# Patient Record
Sex: Female | Born: 1950 | Race: White | Hispanic: No | Marital: Married | Smoking: Former smoker
Health system: Southern US, Community
[De-identification: ages and names within clinical notes are randomized; demographics above are authoritative.]

## PROBLEM LIST (undated history)

## (undated) DIAGNOSIS — K589 Irritable bowel syndrome without diarrhea: Secondary | ICD-10-CM

## (undated) DIAGNOSIS — F419 Anxiety disorder, unspecified: Secondary | ICD-10-CM

## (undated) DIAGNOSIS — K219 Gastro-esophageal reflux disease without esophagitis: Secondary | ICD-10-CM

## (undated) DIAGNOSIS — R7303 Prediabetes: Secondary | ICD-10-CM

## (undated) DIAGNOSIS — K5790 Diverticulosis of intestine, part unspecified, without perforation or abscess without bleeding: Secondary | ICD-10-CM

## (undated) DIAGNOSIS — I10 Essential (primary) hypertension: Secondary | ICD-10-CM

## (undated) DIAGNOSIS — Z9889 Other specified postprocedural states: Secondary | ICD-10-CM

## (undated) HISTORY — DX: Anxiety disorder, unspecified: F41.9

## (undated) HISTORY — DX: Essential (primary) hypertension: I10

## (undated) HISTORY — DX: Prediabetes: R73.03

## (undated) HISTORY — PX: TONSILLECTOMY: SUR1361

## (undated) HISTORY — DX: Irritable bowel syndrome, unspecified: K58.9

## (undated) HISTORY — DX: Other specified postprocedural states: Z98.890

## (undated) HISTORY — PX: CHOLECYSTECTOMY: SHX55

## (undated) HISTORY — DX: Gastro-esophageal reflux disease without esophagitis: K21.9

## (undated) HISTORY — PX: OTHER SURGICAL HISTORY: SHX169

## (undated) HISTORY — DX: Diverticulosis of intestine, part unspecified, without perforation or abscess without bleeding: K57.90

---

## 2001-02-16 ENCOUNTER — Ambulatory Visit (HOSPITAL_COMMUNITY): Admission: RE | Admit: 2001-02-16 | Discharge: 2001-02-16 | Payer: Self-pay | Admitting: Internal Medicine

## 2001-03-12 ENCOUNTER — Ambulatory Visit (HOSPITAL_COMMUNITY): Admission: RE | Admit: 2001-03-12 | Discharge: 2001-03-12 | Payer: Self-pay | Admitting: Internal Medicine

## 2001-03-12 ENCOUNTER — Encounter: Payer: Self-pay | Admitting: Internal Medicine

## 2004-08-26 ENCOUNTER — Ambulatory Visit: Payer: Self-pay | Admitting: Internal Medicine

## 2004-10-24 ENCOUNTER — Ambulatory Visit: Payer: Self-pay | Admitting: Internal Medicine

## 2005-04-14 ENCOUNTER — Ambulatory Visit: Payer: Self-pay | Admitting: Internal Medicine

## 2005-07-17 ENCOUNTER — Emergency Department (HOSPITAL_COMMUNITY): Admission: EM | Admit: 2005-07-17 | Discharge: 2005-07-17 | Payer: Self-pay | Admitting: Emergency Medicine

## 2006-05-18 ENCOUNTER — Ambulatory Visit: Payer: Self-pay | Admitting: Internal Medicine

## 2006-06-08 ENCOUNTER — Ambulatory Visit (HOSPITAL_COMMUNITY): Admission: RE | Admit: 2006-06-08 | Discharge: 2006-06-08 | Payer: Self-pay | Admitting: Internal Medicine

## 2006-06-08 ENCOUNTER — Ambulatory Visit: Payer: Self-pay | Admitting: Internal Medicine

## 2006-06-08 HISTORY — PX: COLONOSCOPY: SHX174

## 2006-06-08 HISTORY — PX: ESOPHAGOGASTRODUODENOSCOPY: SHX1529

## 2006-08-07 ENCOUNTER — Emergency Department (HOSPITAL_COMMUNITY): Admission: EM | Admit: 2006-08-07 | Discharge: 2006-08-07 | Payer: Self-pay | Admitting: Emergency Medicine

## 2006-10-26 ENCOUNTER — Ambulatory Visit: Payer: Self-pay | Admitting: Internal Medicine

## 2007-05-18 ENCOUNTER — Ambulatory Visit: Payer: Self-pay | Admitting: Internal Medicine

## 2009-04-25 ENCOUNTER — Telehealth (INDEPENDENT_AMBULATORY_CARE_PROVIDER_SITE_OTHER): Payer: Self-pay | Admitting: *Deleted

## 2009-05-28 DIAGNOSIS — R197 Diarrhea, unspecified: Secondary | ICD-10-CM | POA: Insufficient documentation

## 2009-05-28 DIAGNOSIS — F411 Generalized anxiety disorder: Secondary | ICD-10-CM | POA: Insufficient documentation

## 2009-05-28 DIAGNOSIS — T7840XA Allergy, unspecified, initial encounter: Secondary | ICD-10-CM | POA: Insufficient documentation

## 2009-05-28 DIAGNOSIS — K589 Irritable bowel syndrome without diarrhea: Secondary | ICD-10-CM | POA: Insufficient documentation

## 2009-05-29 ENCOUNTER — Ambulatory Visit: Payer: Self-pay | Admitting: Internal Medicine

## 2009-05-29 DIAGNOSIS — K219 Gastro-esophageal reflux disease without esophagitis: Secondary | ICD-10-CM | POA: Insufficient documentation

## 2009-05-29 DIAGNOSIS — K648 Other hemorrhoids: Secondary | ICD-10-CM | POA: Insufficient documentation

## 2009-05-29 DIAGNOSIS — K921 Melena: Secondary | ICD-10-CM | POA: Insufficient documentation

## 2009-06-13 ENCOUNTER — Telehealth: Payer: Self-pay | Admitting: Gastroenterology

## 2010-04-22 ENCOUNTER — Ambulatory Visit: Payer: Self-pay | Admitting: Gastroenterology

## 2010-07-03 ENCOUNTER — Encounter: Payer: Self-pay | Admitting: Gastroenterology

## 2010-10-16 ENCOUNTER — Encounter (INDEPENDENT_AMBULATORY_CARE_PROVIDER_SITE_OTHER): Payer: Self-pay | Admitting: *Deleted

## 2010-10-29 NOTE — Medication Information (Signed)
Summary: aciphex rx  aciphex rx   Imported By: Hendricks Limes LPN 16/06/9603 54:09:81  _____________________________________________________________________  External Attachment:    Type:   Image     Comment:   External Document

## 2010-10-29 NOTE — Assessment & Plan Note (Signed)
Summary: MEDS NOT WORKING,HAVING PROBLEMS WITH REFLUX/SS   Visit Type:  f/u Primary Care Provider:  [name], MD  Chief Complaint:  problems with reflux- alot of burning.  History of Present Illness: Patient is here for f/u of GERD. Recently had flare with increased heartburn and belching. Lasted for about two weeks. Took Aciphex two times a day and now heartburn resolved. Also had PNA in 5/11, treated as outpatient. Now c/o increased belching, but some improvement with avoidance of spicey foods. Denies dysphagia. Weight is up 6 pounds. Appetite good, but less active due to PNA and hot weather. She sucks on hard candy but not daily. Drinks out of straw at restaurants only.  Patient notes her reflux is lot better since she called to make appt.  Current Medications (verified): 1)  Aciphex 20 Mg Tbec (Rabeprazole Sodium) .... Once Daily 2)  Lexapro 20 Mg Tabs (Escitalopram Oxalate) .... Once Daily 3)  Toprol Xl 50 Mg Xr24h-Tab (Metoprolol Succinate) .... Once Daily 4)  Lorazepam 0.5 Mg Tabs (Lorazepam) .... As Needed 5)  Imitrex 50 Mg Tabs (Sumatriptan Succinate) .... As Needed 6)  Aspirin 81 Mg Tabs (Aspirin) .... Once Daily 7)  Multivitamins  Tabs (Multiple Vitamin) .... Once Daily 8)  Fish Oil 1000 Mg Caps (Omega-3 Fatty Acids) .... Once Daily 9)  Caltrate 600+d 600-400 Mg-Unit Tabs (Calcium Carbonate-Vitamin D) .... Two Times A Day 10)  Pepto .... As Needed 11)  Aleve .... As Needed 12)  Tums .... As Needed 13)  Claritin .... As Needed 14)  Zetia 10 Mg Tabs (Ezetimibe) .... Once Daily 15)  Losartan Potassium-Hctz 50-12.5 Mg Tabs (Losartan Potassium-Hctz) .... Once Daily  Allergies (verified): 1)  ! Ultram 2)  ! Toradol 3)  ! * Guaifenesin 4)  ! * Prempro 5)  ! * Transdermal Patch  Family History: Father had "stomach cancer" Sister, deceased age 43, throat cancer  Review of Systems      See HPI  Vital Signs:  Patient profile:   60 year old female Height:      67  inches Weight:      178 pounds BMI:     27.98 Temp:     97.8 degrees F oral Pulse rate:   80 / minute BP sitting:   102 / 78  (left arm) Cuff size:   regular  Vitals Entered By: Hendricks Limes LPN (April 22, 2010 10:04 AM)  Physical Exam  General:  Well developed, well nourished, no acute distress. Head:  Normocephalic and atraumatic. Eyes:  sclera nonicteric Mouth:  op moist Abdomen:  Bowel sounds normal.  Abdomen is soft, nontender, nondistended.  No rebound or guarding.  No hepatosplenomegaly, masses or hernias.  No abdominal bruits.  Extremities:  No clubbing, cyanosis, edema or deformities noted. Neurologic:  Alert and  oriented x4;  grossly normal neurologically. Skin:  Intact without significant lesions or rashes. Psych:  Alert and cooperative. Normal mood and affect.  Impression & Recommendations:  Problem # 1:  GERD (ICD-530.81)  Some recent refractory symptoms improved with two times a day Aciphex. Would advise continue two times a day for one month. If symptoms persistent at that time, then consider EGD. RX for mail-order and samples provided. Gas/bloat sheet provided. Anti-reflux measures discussed.  OV in six months with Dr. Jena Gauss.  Orders: Est. Patient Level II ([address]) Prescriptions: ACIPHEX 20 MG TBEC (RABEPRAZOLE SODIUM) one by mouth 30 mins before breakfast daily Brand medically necessary #90 x 3   Entered and Authorized by:  Leanna Battles. Dixon Boos   Signed by:   Leanna Battles Sky Primo PA-C on 04/22/2010   Method used:   Print then Give to Patient   RxID:   [id number]   Appended Document: MEDS NOT [facility] PROBLEMS WITH REFLUX/SS REMINDER IN COMPUTER

## 2010-10-29 NOTE — Assessment & Plan Note (Signed)
Summary: fu ov to further medication refills,bleeding hemorrhoids,acid...   Visit Type:  f/u Primary Care Provider:  [name], MD  Chief Complaint:  follow up- needs refills, still has blood with bm, and but otherwise doing ok.  History of Present Illness: Patient is a pleasant 60 year old Caucasian female with a history of diarrhea predominant IBS and chronic GERD. She also has a history of intermittent toilet tissue hematochezia felt to be due to hemorrhoids. She requests a refill of AcipHex recently and we requested she come in to be seen as been nearly 2 years since her last office visit. She does well as long she takes her AcipHex. She is belching every day but only has heartburn about once him on usually related to dietary indiscretions. She denies problems swallowing. She continued to have bowel movements multiple times a day usually 1-3 formed stools daily. She has diarrhea only occasionally. She has bright red blood per rectum noted on the toilet tissue frequently. About 2 years ago we referred her to Dr. Lovell Sheehan for consideration of hemorrhoidectomy but according to the patient it was felt that her disease was only mild and he did not recommend surgery. She uses preparation H. cream and wipes on a regular basis. She denies abdominal pain or weight loss.  Current Medications (verified): 1)  Aciphex 20 Mg Tbec (Rabeprazole Sodium) .... Once Daily 2)  Lexapro 20 Mg Tabs (Escitalopram Oxalate) .... Once Daily 3)  Toprol Xl 50 Mg Xr24h-Tab (Metoprolol Succinate) .... Once Daily 4)  Lorazepam 0.5 Mg Tabs (Lorazepam) .... As Needed 5)  Imitrex 50 Mg Tabs (Sumatriptan Succinate) .... As Needed 6)  Aspirin 81 Mg Tabs (Aspirin) .... Once Daily 7)  Multivitamins  Tabs (Multiple Vitamin) .... Once Daily 8)  Fish Oil 1000 Mg Caps (Omega-3 Fatty Acids) .... Once Daily 9)  Caltrate 600+d 600-400 Mg-Unit Tabs (Calcium Carbonate-Vitamin D) .... Two Times A Day 10)  Pepto .... As Needed 11)  Aleve  .... As Needed 12)  Tums .... As Needed 13)  Claritin .... As Needed  Allergies (verified): 1)  ! Ultram 2)  ! Toradol 3)  ! * Guaifenesin 4)  ! * Prempro 5)  ! * Transdermal Patch  Past History:  Past Surgical History: Last updated: 05/28/2009 TONSILLECTOMY LEFT GREAT TOE SURGERY COLD KNIFE CONIZATION CHOLECYSTECTOMY  Past Medical History: Anxiety Disorder GERD Hypertension Irritable Bowel Syndrome Hemorrhoids  Family History: Father had "stomach cancer"  Review of Systems      See HPI  Vital Signs:  Patient profile:   60 year old female Height:      67 inches Weight:      172 pounds BMI:     27.04 Temp:     98.8 degrees F oral Pulse rate:   60 / minute BP sitting:   142 / 98  (left arm) Cuff size:   regular  Vitals Entered By: Hendricks Limes LPN (May 29, 2009 10:44 AM)  Physical Exam  General:  Well developed, well nourished, no acute distress. Head:  Normocephalic and atraumatic. Eyes:  Sclera nonicteric. Mouth:  OP moist. Lungs:  Clear throughout to auscultation. Heart:  Regular rate and rhythm; no murmurs, rubs,  or bruits. Abdomen:  Bowel sounds normal.  Abdomen is soft, nontender, nondistended.  No rebound or guarding.  No hepatosplenomegaly, masses or hernias.  No abdominal bruits.  Rectal:  Normal exam. No external lesions.  Brown stool heme negative. Extremities:  No clubbing, cyanosis, edema or deformities noted. Neurologic:  Alert and  oriented x4;  grossly normal neurologically. Skin:  Intact without significant lesions or rashes. Psych:  Alert and cooperative. Normal mood and affect.  Impression & Recommendations:  Problem # 1:  IRRITABLE BOWEL SYNDROME (ICD-564.1)  stable. Infrequent episodes of diarrhea managed with Pepto-Bismol.  Orders: Est. Patient Level III ([address])  Problem # 2:  HEMATOCHEZIA (ICD-578.1)  Frequent low volume toilet paper hematochezia likely secondary to hemorrhoids. Last colonoscopy 3 years ago. Findings  showed internal hemorrhoids only. Prep was good. There family history colon cancer and no personal history of polyps. Would treat for hemorrhoids. If symptoms worsen would consider colonoscopy and we'll also address with Dr. Jena Gauss but doubt colonoscopy to be offered at this time.  Orders: Est. Patient Level III ([address]) Hemoccult Guaiac-1 spec.(in office) (82270)  Problem # 3:  GERD (ICD-530.81)  well controlled on AcipHex.  Orders: Est. Patient Level III ([address]387) Prescriptions: LIDOCAINE-HYDROCORTISONE ACE 3-1 % KIT (LIDOCAINE-HYDROCORTISONE ACE) apply anorectally two times a day for 14 days  #28 x 0   Entered and Authorized by:   Leanna Battles. Dixon Boos   Signed by:   Leanna Battles Trista Ciocca PA-C on 05/29/2009   Method used:   Print then Give to Patient   RxID:   [id number] ACIPHEX 20 MG TBEC (RABEPRAZOLE SODIUM) once daily Brand medically necessary #90 x 3   Entered and Authorized by:   Leanna Battles. Dixon Boos   Signed by:   Leanna Battles Madyson Lukach PA-C on 05/29/2009   Method used:   Print then Give to Patient   RxID:   [id number]

## 2010-10-31 NOTE — Letter (Signed)
Summary: Recall Office Visit  [facility]  [address]   [place], [place] [address]   Phone: [phone]  Fax: [phone]      October 16, 2010   Caitlin Tanner [address] [place] [address] [place], [place]  [address] 1950-10-25   Dear Ms. Caitlin Tanner,   According to our records, it is time for you to schedule a follow-up office visit with [place].   At your convenience, please call [phone] to schedule an office visit. If you have any questions, concerns, or feel that this letter is in error, we would appreciate your call.   Sincerely,    Diana Eves  [facility] Ph: [phone]   Fax: [phone]

## 2010-11-21 ENCOUNTER — Ambulatory Visit (INDEPENDENT_AMBULATORY_CARE_PROVIDER_SITE_OTHER): Payer: BC Managed Care – PPO | Admitting: Otolaryngology

## 2010-11-21 DIAGNOSIS — J343 Hypertrophy of nasal turbinates: Secondary | ICD-10-CM

## 2010-11-21 DIAGNOSIS — J310 Chronic rhinitis: Secondary | ICD-10-CM

## 2010-11-28 HISTORY — PX: ESOPHAGOGASTRODUODENOSCOPY: SHX1529

## 2010-11-28 HISTORY — PX: OTHER SURGICAL HISTORY: SHX169

## 2010-11-28 HISTORY — PX: COLONOSCOPY: SHX174

## 2010-12-04 ENCOUNTER — Encounter: Payer: Self-pay | Admitting: Urgent Care

## 2010-12-10 ENCOUNTER — Encounter: Payer: Self-pay | Admitting: Gastroenterology

## 2010-12-10 ENCOUNTER — Ambulatory Visit (INDEPENDENT_AMBULATORY_CARE_PROVIDER_SITE_OTHER): Payer: BC Managed Care – PPO | Admitting: Urgent Care

## 2010-12-10 ENCOUNTER — Encounter: Payer: Self-pay | Admitting: Urgent Care

## 2010-12-10 ENCOUNTER — Encounter: Payer: Self-pay | Admitting: Internal Medicine

## 2010-12-10 DIAGNOSIS — K921 Melena: Secondary | ICD-10-CM

## 2010-12-10 DIAGNOSIS — R197 Diarrhea, unspecified: Secondary | ICD-10-CM

## 2010-12-10 DIAGNOSIS — K219 Gastro-esophageal reflux disease without esophagitis: Secondary | ICD-10-CM

## 2010-12-17 NOTE — Letter (Signed)
Summary: [facility] (ACIPHEX)  [facility] [facility] (ACIPHEX)   Imported By: Rexene Alberts 12/10/2010 10:48:06  _____________________________________________________________________  External Attachment:    Type:   Image     Comment:   External Document

## 2010-12-17 NOTE — Letter (Signed)
Summary: [facility]/EGD ORDER  [facility]/EGD ORDER   Imported By: Ave Filter 12/10/2010 11:44:14  _____________________________________________________________________  External Attachment:    Type:   Image     Comment:   External Document

## 2010-12-17 NOTE — Assessment & Plan Note (Signed)
Summary: FU   Vital Signs:  Patient profile:   60 year old female Height:      66 inches Weight:      181 pounds BMI:     29.32 Temp:     99 degrees F oral Pulse rate:   68 / minute BP sitting:   136 / 76  (left arm)  Vitals Entered By: Carolan Clines LPN (December 10, 2010 10:42 AM)  Visit Type:  Follow-up Visit Primary Care Provider:  Dr.  Loni Dolly [facility] [place])   History of Present Illness: Here for FU GERD/IBS.  Lots of belching & heartburn.  Takes Aciphex 20mg  daily everyday in AM, 5 times per mo at bedtime too.  Not assoc w/ certain foods.  Denies dysphagia or odynophagia.  Appetite ok.  Wt steadily increasing.   BM looser than normal 3-4 per day, normal two times a day.  Has hx hemorrhoids, has tried OTC treatments but continues to notice small amts bright red blood on toilet tissue.  Has seen Dr Lovell Sheehan, but was told hemorrhoids not bad enough for surgery.  Hx stomach "quivers".  Has severe urgency.  Using [employer].  Metamucil caused constipation in past.  Alternates between constipation & loose stools.  Baby ASA daily, Aleve once daily about 4 days per week.     Current Medications (verified): 1)  Aciphex 20 Mg Tbec (Rabeprazole Sodium) .... One By Mouth 30 Mins Before Breakfast Daily 2)  Lexapro 10 Mg Tabs (Escitalopram Oxalate) .... Take One Once Daily 3)  Toprol Xl 50 Mg Xr24h-Tab (Metoprolol Succinate) .... Once Daily 4)  Lorazepam 0.5 Mg Tabs (Lorazepam) .... As Needed 5)  Imitrex 50 Mg Tabs (Sumatriptan Succinate) .... As Needed 6)  Aspirin 81 Mg Tabs (Aspirin) .... Once Daily 7)  Multivitamins  Tabs (Multiple Vitamin) .... Once Daily 8)  Fish Oil 1000 Mg Caps (Omega-3 Fatty Acids) .... Once Daily 9)  Caltrate 600+d 600-400 Mg-Unit Tabs (Calcium Carbonate-Vitamin D) .... Two Times A Day 10)  Pepto .... As Needed 11)  Aleve .... As Needed 12)  Tums .... As Needed 13)  Zyrtec Hives Relief 10 Mg Tabs (Cetirizine Hcl) .... Take One Once Daily 14)  Zetia 10 Mg Tabs (Ezetimibe)  .... Once Daily 15)  Losartan Potassium-Hctz 50-12.5 Mg Tabs (Losartan Potassium-Hctz) .... Once Daily 16)  Flonase 50 Mcg/act Susp (Fluticasone Propionate) .... Take 1-2 Sprays As Needed  Allergies: 1)  ! Ultram 2)  ! Toradol 3)  ! * Prempro 4)  ! 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 remote Alcohol Use - no Illicit Drug Use - no Patient does not get regular exercise.  Does Patient Exercise:  no Smoking Status:  quit Drug Use:  no  Review of Systems General:  Denies fever, chills, sweats, anorexia, fatigue, weakness, malaise, weight loss, and sleep disorder; hot flashes. CV:  Denies chest pains, angina, palpitations, syncope, dyspnea on exertion, orthopnea, PND, peripheral edema, and claudication. Resp:  Denies dyspnea at rest, dyspnea with exercise, cough, sputum, wheezing, coughing up blood, and pleurisy. GI:  Denies jaundice and  fecal incontinence. GU:  Denies urinary burning, blood in urine, nocturnal urination, urinary frequency, urinary incontinence, and abnormal vaginal bleeding. MS:  Complains of low back pain; denies joint pain / LOM, joint swelling, joint stiffness, joint deformity, muscle weakness, muscle cramps, muscle atrophy, leg pain at night, leg pain with exertion, and shoulder pain / LOM hand / wrist pain (CTS); sciatica. Derm:  Complains of rash; denies itching, dry skin, hives, moles, warts, and unhealing ulcers; blotches sees Dr.  Jorja Loa. Psych:  Complains of anxiety; denies depression, memory loss, suicidal ideation, hallucinations, paranoia, phobia, and confusion. Heme:  Denies bruising and enlarged lymph nodes.  Physical Exam  General:  Well developed, well nourished, no acute distress. Head:  Normocephalic and atraumatic. Eyes:  Sclera clear, no icterus. Ears:  Normal auditory acuity. Nose:  No deformity, discharge,  or lesions. Mouth:  No deformity or lesions, dentition normal. Neck:  Supple; no masses or thyromegaly. Lungs:  Clear throughout to auscultation. Heart:  Regular rate and rhythm; no murmurs, rubs,  or bruits. Abdomen:  Multiple cherry angiomas.  Soft, nontender and nondistended. No masses, hepatosplenomegaly or hernias noted. Normal bowel sounds.without guarding and without rebound.   Msk:  Symmetrical with no gross deformities. Normal posture. Pulses:  Normal pulses noted. Extremities:  No clubbing, cyanosis, edema or deformities noted. Neurologic:  Alert and  oriented x4;  grossly normal neurologically. Skin:  Intact without significant lesions or rashes. Cervical Nodes:  No significant cervical adenopathy. Psych:  Alert and cooperative. Normal mood and affect.   Impression & Recommendations:  Problem # 1:  HEMATOCHEZIA (ICD-578.1) 59 y/o caucasian female w/ intermittant hematochezia in the setting of chronic diarrhea.  Known IBS & hemorrhoids, but last colonoscopy 5 1/2 yrs ago.  I do feel we should rule out colorectal ca or polyps, inflammatory bowel disease, NSAID-induced colopathy and microscopic colitis.  Diagnostic colonoscopyand EGD to be performed by Dr. Suszanne Conners Rourk in the near future.  I have discussed risks and benefits which include, but are not limited to, bleeding, infection, perforation, or medication reaction.  The patient agrees with this plan and consent will be obtained.    Orders: Est. Patient Level IV (01[address])  Problem # 2:  GERD (ICD-530.81) Refractory symptoms  despite once, sometimes twice, daily PPI.  Will need EGD to r/o complicated GERD, gastritis, esophagitis, PUD.  Problem # 3:  Hx of IRRITABLE BOWEL SYNDROME (ICD-564.1) May be culprit of diarrhea  Patient Instructions: 1)  Begin Restora one by mouth daily, #10 given 2)  Continue ACIPHEX 20mg  two times a day until colonoscopy 3)  Limit use ALEVE, use tylenol arthritis instead   Orders Added: 1)  Est. Patient Level IV [02585]

## 2010-12-23 ENCOUNTER — Encounter: Payer: BC Managed Care – PPO | Admitting: Internal Medicine

## 2010-12-23 ENCOUNTER — Other Ambulatory Visit: Payer: Self-pay | Admitting: Internal Medicine

## 2010-12-23 ENCOUNTER — Ambulatory Visit (HOSPITAL_COMMUNITY)
Admission: RE | Admit: 2010-12-23 | Discharge: 2010-12-23 | Disposition: A | Discharge status: Home / Self Care | Payer: BC Managed Care – PPO | Source: Ambulatory Visit | Attending: Internal Medicine | Admitting: Internal Medicine

## 2010-12-23 DIAGNOSIS — K921 Melena: Secondary | ICD-10-CM | POA: Insufficient documentation

## 2010-12-23 DIAGNOSIS — K5730 Diverticulosis of large intestine without perforation or abscess without bleeding: Secondary | ICD-10-CM | POA: Insufficient documentation

## 2010-12-23 DIAGNOSIS — K219 Gastro-esophageal reflux disease without esophagitis: Secondary | ICD-10-CM

## 2010-12-23 DIAGNOSIS — I10 Essential (primary) hypertension: Secondary | ICD-10-CM | POA: Insufficient documentation

## 2010-12-23 DIAGNOSIS — R197 Diarrhea, unspecified: Secondary | ICD-10-CM | POA: Insufficient documentation

## 2010-12-23 DIAGNOSIS — Z79899 Other long term (current) drug therapy: Secondary | ICD-10-CM | POA: Insufficient documentation

## 2010-12-23 DIAGNOSIS — K648 Other hemorrhoids: Secondary | ICD-10-CM | POA: Insufficient documentation

## 2010-12-23 DIAGNOSIS — Z9889 Other specified postprocedural states: Secondary | ICD-10-CM

## 2010-12-23 HISTORY — DX: Other specified postprocedural states: Z98.890

## 2010-12-24 NOTE — Op Note (Signed)
NAME:  Caitlin Tanner, Caitlin Tanner                ACCOUNT NO.:  [id number]  MEDICAL RECORD NO.:  [id number]           PATIENT TYPE:  O  LOCATION:  [facility]                          FACILITY:  [facility]  PHYSICIAN:  R. Roetta Sessions, M.D. DATE OF BIRTH:  [birth year]  DATE OF PROCEDURE:  12/23/2010 DATE OF DISCHARGE:                              OPERATIVE REPORT   EGD with esophageal biopsy followed by ileocolonoscopy with biopsy.  INDICATIONS FOR PROCEDURE:  A 60 year old lady with intermittent hematochezia in the setting of chronic diarrhea, history of IBSD.  Last colonoscopy almost 6 years ago.  Also reflux symptoms apparently refractory, once daily, sometimes twice daily PPI therapy.  EGD and colonoscopy now being done.  Risks, benefits, limitations, alternatives, imponderables have been discussed, questions answered.  Please see the documentation in the medical record.  PROCEDURE NOTE:  O2 saturation, blood pressure, pulse, and respirations were monitored throughout the entirety of both procedures.  CONSCIOUS SEDATION:  Versed 6 mg IV, Demerol 150 mg IV in divided doses.  INSTRUMENT:  Pentax video chip system.  Cetacaine spray for topical pharyngeal anesthesia.  EGD FINDINGS:  Examination of the tubular esophagus revealed some longitudinal furrows in the esophageal body, mucosa otherwise intact, I did not see any erosions or ulcerations.  Patent esophagus through its entirety.  EG junction easily traversed.  Stomach:  Gastric cavity, very minimal amount of food, debris, and viscous bilious stained mucus, which was easily washed and suctioned out.  Thorough examination of the gastric mucosa including retroflexed proximal stomach esophagogastric junction demonstrated only a small hiatal hernia.  Pylorus is patent, easily traversed.  Examination of bulb second portion revealed no abnormalities.  Therapeutic/diagnostic maneuvers performed:  Biopsies of the mid and distal esophagus were negative for  histologic study.  The patient tolerated the procedure well and was prepared for colonoscopy.  Digital rectal exam revealed no abnormalities.  ENDOSCOPIC FINDINGS:  Prep was adequate.  Colon:  Colonic mucosa was surveyed from the rectosigmoid junction through the left transverse, right colon, to the appendiceal orifice, ileocecal valve/cecum.  These structures were well seen and photographed for the record.  Terminal ileum was intubated 10 cm, from this level, scope was slowly and cautiously withdrawn.  All previously mentioned mucosal surfaces were again seen.  The patient had just a couple of scattered diverticula throughout her colon.  Remainder of the colonic mucosa appeared normal as did the terminal ileal mucosa.  Biopsies of the ascending and sigmoid segments were taken to rule out microscopic colitis.  Scope was pulled down the rectum.  Thorough examination of the rectal mucosa including retroflexed view of the anal verge and en face view of the anal canal demonstrated anal papillae and anal canal hemorrhoids.  The patient tolerated the colonoscopy very well.  Cecal withdrawal time 7 minutes.  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  Colonoscopy findings, anal canal, hemorrhoids, anal papilla, otherwise normal rectum, few scattered left, pancolonic diverticula.  Remainder of the colonic mucosa and terminal mucosa appeared  normal status post segmental biopsy ascending and sigmoid segments.  RECOMMENDATIONS: 1. Follow up on pending biopsies. 2. I suspect the patient bled from hemorrhoids.  We will prescribe a     10-day course of Anusol-HC suppositories one per rectum at bedtime. 3. Further recommendations to follow.     Jonathon Bellows, M.D.     RMR/MEDQ  D:  12/23/2010   T:  12/23/2010  Job:  [id number]  cc:   Dr. Doree Fudge [facility]  Electronically Signed by Lorrin Goodell M.D. on 12/24/2010 11:26:18 AM

## 2011-01-02 ENCOUNTER — Ambulatory Visit (INDEPENDENT_AMBULATORY_CARE_PROVIDER_SITE_OTHER): Payer: BC Managed Care – PPO | Admitting: Otolaryngology

## 2011-01-27 ENCOUNTER — Encounter: Payer: Self-pay | Admitting: Internal Medicine

## 2011-02-11 NOTE — Assessment & Plan Note (Signed)
NAMEMarland Kitchen  Caitlin Tanner, Caitlin Tanner                 CHART#:  [id number]   DATE:  05/18/2007                       DOB:  [birth year]   CHIEF COMPLAINT:  Nausea, diarrhea for two weeks.   SUBJECTIVE:  Caitlin Tanner is a 60 year old female.  She has history of  diarrhea-predominant IBS, as well as gastroesophageal reflux disease.  She had an EGD and colonoscopy on June 08, 2006, which demonstrated  a single distal esophageal erosion and small internal hemorrhoids,  otherwise a normal exam.  She has history of intermittent hematochezia.  She tells me approximately two weeks ago, she developed daily diarrhea.  She is having upwards of five to eight bowel movements per day.  She  also complains of postprandial nausea.  She had been treated with  prednisone and antibiotics for a bee sting back in July.  She went  through two courses of antibiotics for cellulitis from this.  She tells  me she has been sipping on ginger ale and Coke to try to ease her  nausea.  She has a history of frequent migraines, as well.  She takes  Aleve rarely.  She is on AcipHex 20 mg daily, which does seem to control  her heartburn and indigestion.  She does complain of increased belching.  She was recently given Zantac 300 mg to take at bedtime and started this  approximately a little over a month ago.  She tells me her husband had  nausea and diarrhea, as well, although his was limited to a couple of  days, and she also notes that her neighbor was ill too.  She denies any  fever.  She has noted some chills.   CURRENT MEDICATIONS:  See the list from May 18, 2007.   ALLERGIES:  ULTRAM TRANSDERMAL PATCH, TORADOL, GUAIFENESIN, PREMPRO, and  FEMHRT.   OBJECTIVE:  VITAL SIGNS:  Weight 172.5 pounds, height 67 inches,  temperature 98.6, blood pressure 140/90 and pulse 64.  GENERAL:  Caitlin Tanner is a 60 year old female, who is well-developed,  well-nourished, in no acute distress.  HEENT:  Sclerae are clear, anicteric,  conjunctivae pink, oropharynx pink  and moist.  She does have white plaque to her distal dorsum of her  tongue.  CHEST:  Heart regular rate and rhythm, normal S1, S2.  ABDOMEN:  Positive bowel sounds times four, no bruits auscultated, soft,  nontender, nondistended, without palpable masses, organomegaly, no  rebound tenderness or guarding.  EXTREMITIES:  Without clubbing or edema bilaterally.  SKIN:  Pink, warm, dry, without any rash or jaundice.   ASSESSMENT:  Caitlin Tanner is a 60 year old female with diarrhea-  predominant IBS.  She tells me she is having increase in her baseline  stools, anywhere from five to eight per day, for the last two weeks,  along with some nausea.  She is status post two rounds of antibiotics  and I believe we may be dealing with C-diff colitis versus  pseudomembranous colitis.  It is also possible that this could be  bacterial in etiology, as well.   She also has oral candidiasis.   PLAN:  1. Align Probiotic once daily.  I have given her samples.  2. She is to eat yogurt with each meal.  3. She has Nystatin at home.  She was instructed to take 5 mm q.i.d.  for two weeks.  4. Full set of stool studies, to include culture and sensitivity, ova      and parasites, C-diff and lactoferrin.  5. Continue AcipHex 20 mg daily.  6. She can drop the Zantac, since she is not noticing much difference      with this.  7. Phenergan 12.5 mg one p.o. q. 6-8 hours p.r.n. nausea or vomiting      #10 with no refills.  8. We will call her with stool results and she is going to call if she      has any problems in the interim.       Lorenza Burton, N.P.  Electronically Signed     R. Roetta Sessions, M.D.  Electronically Signed    KJ/MEDQ  D:  05/18/2007  T:  05/19/2007  Job:  [id number]   cc:   [name]

## 2011-02-14 NOTE — Op Note (Signed)
NAMEARTEMISIA, Caitlin Tanner                ACCOUNT NO.:  [id number]   MEDICAL RECORD NO.:  [id number]          PATIENT TYPE:  AMB   LOCATION:  DAY                           FACILITY:  [facility]   PHYSICIAN:  R. Roetta Sessions, M.D. DATE OF BIRTH:  [birth year]   DATE OF PROCEDURE:  06/08/2006  DATE OF DISCHARGE:                                 OPERATIVE REPORT   PROCEDURE:  Esophagogastroduodenoscopy followed by colonoscopy.   INDICATIONS:  The patient is a 60 year old lady with low-volume  hematochezia. Last colonoscopy was 2002.  Colonoscopy is now being done to  further evaluate hematochezia.  The potential risks, benefits and  alternatives have been reviewed. Questions answered.  Please see  documentation in the medical record.   DESCRIPTION OF PROCEDURE:  Oxygen saturation, blood pressure, pulse and  respiration were monitored throughout the entire procedure.  Conscious  sedation with Versed 5 mg IV and Demerol 100 mg IV in divided doses.  The  instrument was the Olympus video chip system.   FINDINGS:  Digital rectal exam revealed no abnormalities.   ENDOSCOPIC FINDINGS:  Prep was good.  Rectum:  Examination of the rectal mucosa including retroflexion in the anal  verge revealed only some internal hemorrhoids.  Colon:  Colonic mucosa was surveyed from the rectosigmoid junction through  the left, transverse,  right colon to the area of the appendiceal orifice,  ileocecal valve and cecum.  These structures were well seen and photographed  for the record. From this level, the scope was slowly and cautiously  withdrawn.  All previously mentioned mucosal surfaces were again seen.  The  colonic mucosa appeared normal.   Please note EGD was performed prior to colonoscopy.   EGD FINDINGS:  Esophagus:  Examination of the tubular esophagus revealed a  single distal esophageal erosion.  Otherwise the esophageal mucosa appeared  normal.  EG junction was easily traversed.   Stomach:  The gastric  cavity was empty and insufflated well with air.  Examination of the gastric mucosa including retroflexion in the proximal  stomach and the esophagogastric junction demonstrated no abnormalities.  Pylorus was patent and easily traversed.  Examination of the bulb and second  portion revealed no abnormalities.   THERAPY AND DIAGNOSTIC MANEUVERS:  None.   The patient tolerated the procedures well and was reactive after endoscopy.   IMPRESSION:  Esophagogastroduodenoscopy:  Single distal esophageal erosion  consistent with mild gastroesophageal reflux and esophagitis.  Otherwise  normal esophagus, normal stomach, D1 and D2.   COLONOSCOPY FINDINGS:  Internal hemorrhoids, otherwise normal rectum and  colon.   RECOMMENDATIONS:  1. Stop AcipHex.  Begin Zegerid 40 mg orally daily.  Prescription given.  2. Hemorrhoid literature provided to [name].  3. Three-week course of Canasa 1 g __________ at bedtime.  4. Followup appointment with [place] in four months.      Jonathon Bellows, M.D.  Electronically Signed     RMR/MEDQ  D:  06/08/2006  T:  06/08/2006  Job:  [id number]   cc:   [name]  Fax: [phone]

## 2011-04-24 ENCOUNTER — Ambulatory Visit (INDEPENDENT_AMBULATORY_CARE_PROVIDER_SITE_OTHER): Payer: BC Managed Care – PPO | Admitting: Otolaryngology

## 2011-04-24 DIAGNOSIS — J310 Chronic rhinitis: Secondary | ICD-10-CM

## 2011-04-24 DIAGNOSIS — J343 Hypertrophy of nasal turbinates: Secondary | ICD-10-CM

## 2011-04-24 DIAGNOSIS — J45909 Unspecified asthma, uncomplicated: Secondary | ICD-10-CM

## 2011-05-14 ENCOUNTER — Ambulatory Visit (HOSPITAL_COMMUNITY)
Admission: RE | Admit: 2011-05-14 | Discharge: 2011-05-14 | Disposition: A | Discharge status: Home / Self Care | Payer: BC Managed Care – PPO | Source: Ambulatory Visit | Attending: Pulmonary Disease | Admitting: Pulmonary Disease

## 2011-05-14 DIAGNOSIS — R0609 Other forms of dyspnea: Secondary | ICD-10-CM | POA: Insufficient documentation

## 2011-05-14 DIAGNOSIS — R0989 Other specified symptoms and signs involving the circulatory and respiratory systems: Secondary | ICD-10-CM | POA: Insufficient documentation

## 2011-05-14 LAB — BLOOD GAS, ARTERIAL
Acid-base deficit: 0.5 mmol/L (ref 0.0–2.0)
Bicarbonate: 23.3 mEq/L (ref 20.0–24.0)
Drawn by: 211791
FIO2: 0.21 %
O2 Saturation: 96.5 %
Patient temperature: 37
TCO2: 20.4 mmol/L (ref 0–100)
pCO2 arterial: 36.3 mmHg (ref 35.0–45.0)
pH, Arterial: 7.424 — ABNORMAL HIGH (ref 7.350–7.400)
pO2, Arterial: 80 mmHg (ref 80.0–100.0)

## 2011-05-14 LAB — PULMONARY FUNCTION TEST

## 2011-05-14 MED ORDER — ALBUTEROL SULFATE (5 MG/ML) 0.5% IN NEBU
2.5000 mg | INHALATION_SOLUTION | Freq: Once | RESPIRATORY_TRACT | Status: AC
  Administered 2011-05-14: 2.5 mg via RESPIRATORY_TRACT

## 2011-05-16 NOTE — Procedures (Signed)
Caitlin Tanner, Caitlin Tanner                ACCOUNT NO.:  [id number]  MEDICAL RECORD NO.:  [id number]  LOCATION:                                 FACILITY:  PHYSICIAN:  Eduardo Honor L. Juanetta Gosling, M.D.DATE OF BIRTH:  11/21/1950  DATE OF PROCEDURE: DATE OF DISCHARGE:                           PULMONARY FUNCTION TEST   Reason for pulmonary function testing is shortness of breath and asthma. 1. Spirometry shows a mild ventilatory defect with evidence of airflow     obstruction. 2. Lung volumes are normal. 3. DLCO is severely reduced. 4. Arterial blood gases are normal. 5. There is significant bronchodilator improvement.     Benno Brensinger L. Juanetta Gosling, M.D.     ELH/MEDQ  D:  05/15/2011  T:  05/15/2011  Job:  [id number]

## 2011-06-18 ENCOUNTER — Encounter (HOSPITAL_COMMUNITY): Payer: Self-pay | Admitting: Pulmonary Disease

## 2011-06-23 ENCOUNTER — Ambulatory Visit (INDEPENDENT_AMBULATORY_CARE_PROVIDER_SITE_OTHER): Payer: BC Managed Care – PPO | Admitting: Urgent Care

## 2011-06-23 ENCOUNTER — Encounter: Payer: Self-pay | Admitting: Urgent Care

## 2011-06-23 VITALS — BP 133/79 | HR 68 | Temp 98.0°F | Ht 67.0 in | Wt 180.6 lb

## 2011-06-23 DIAGNOSIS — R141 Gas pain: Secondary | ICD-10-CM

## 2011-06-23 DIAGNOSIS — R142 Eructation: Secondary | ICD-10-CM

## 2011-06-23 DIAGNOSIS — K219 Gastro-esophageal reflux disease without esophagitis: Secondary | ICD-10-CM

## 2011-06-23 DIAGNOSIS — K589 Irritable bowel syndrome without diarrhea: Secondary | ICD-10-CM

## 2011-06-23 MED ORDER — RABEPRAZOLE SODIUM 20 MG PO TBEC: 20.0000 mg | DELAYED_RELEASE_TABLET | Freq: Two times a day (BID) | ORAL | Status: DC

## 2011-06-23 NOTE — Progress Notes (Signed)
Referring Provider: Dr Juliane Poot  Primary Care Physician:  Dr Juliane Poot Primary Gastroenterologist:  Dr. Jena Gauss  Chief Complaint  Patient presents with  . Heartburn    hurting in the chest    HPI:  Caitlin Tanner is a 60 y.o. female here for evaluation of chest pain, GERD & IBS.  C/o "Lots of belching" despite aciphex 20mg  daily.  Dr Sharyn Lull changed to dexilant 60mg  x 1 mo, no difference.  C/o chest soreness.  Appetite ok.  C/o fatigue.  Wt stable.  Denies dysphagia or odynophagia.  Denies rectal bleeding or melena.  Hx IBS-diarrhea predominant at baseline.  Has been cutting back on chocolate, spicy foods.  Gained 8# but has lost it intentionally.  Appt Dr. Wiliam Ke 11/2 in [place], [place] for annual female exam.  Chest xray normal [facility].  Due for mammogram in Nov.  Will have DEXA Bone Density then too. Using straws.  Not chewing gum.  Past Medical History  Diagnosis Date  . Anxiety disorder   . GERD (gastroesophageal reflux disease)   . Hypertension   . IBS (irritable bowel syndrome)   . Hemorrhoids   . Asthma   . S/P colonoscopy 12/23/10    Dr Rourk-diverticulosis, anal canal hemorhhoids, anal papilla, random bx benign  . Diverticulosis   . S/P endoscopy 12/23/10    Dr Mancel Parsons, tubular esophagus, sm HH, minimal retained food, bile-stained mucus, otherwise normal,, esophageal bx  benign   Past Surgical History  Procedure Date  . Tonsillectomy   . Left great toe surgery   . Cold knife conization   . Cholecystectomy   . Esophagogastroduodenoscopy 06/08/2006  . Colonoscopy 06/08/2006   Current Outpatient Prescriptions  Medication Sig Dispense Refill  . aspirin 81 MG tablet Take 81 mg by mouth daily.        . Calcium Carbonate-Vitamin D (CALTRATE 600+D) 600-400 MG-UNIT per tablet Take 1 tablet by mouth 2 (two) times daily.        . cetirizine (ZYRTEC) 5 MG tablet Take 5 mg by mouth daily.        Marland Kitchen escitalopram (LEXAPRO) 20 MG tablet Take 10 mg by mouth  daily.       Marland Kitchen ezetimibe (ZETIA) 10 MG tablet Take 10 mg by mouth daily.        Marland Kitchen LORazepam (ATIVAN) 0.5 MG tablet Take 0.5 mg by mouth as needed.        Marland Kitchen losartan-hydrochlorothiazide (HYZAAR) 50-12.5 MG per tablet Take 1 tablet by mouth daily.        . metoprolol (TOPROL-XL) 50 MG 24 hr tablet Take 50 mg by mouth daily.        . mometasone (NASONEX) 50 MCG/ACT nasal spray Place 2 sprays into the nose daily.        . multivitamin (THERAGRAN) per tablet Take 1 tablet by mouth daily.        . Omega-3 Fatty Acids (FISH OIL) 1000 MG CAPS Take by mouth daily.        . RABEprazole (ACIPHEX) 20 MG tablet Take 1 tablet (20 mg total) by mouth 2 (two) times daily.  180 tablet  3   Allergies as of 06/23/2011 - Review Complete 06/23/2011  Allergen Reaction Noted  . Guaifenesin    . Ketorolac tromethamine    . Prempro    . Tramadol hcl     Family History  Problem Relation Age of Onset  . Stomach cancer Father 43  . Throat cancer Mother 21  .  Diabetes Brother   . Transient ischemic attack Brother    History   Social History  . Marital Status: Married    Spouse Name: N/A    Number of Children: 1  . Years of Education: N/A   Occupational History  .     Social History Main Topics  . Smoking status: Former Smoker -- 1.0 packs/day    Types: Cigarettes  . Smokeless tobacco: Not on file   Comment: quit about 25+ yrs ago  . Alcohol Use: No  . Drug Use: No  . Sexually Active: Not on file   Review of Systems: Gen: Denies any fever, chills, sweats, anorexia, fatigue, weakness, malaise, weight loss, and sleep disorder CV: Denies chest pain, angina, palpitations, syncope, orthopnea, PND, peripheral edema, and claudication. Resp: Denies dyspnea at rest, dyspnea with exercise, cough, sputum, wheezing, coughing up blood, and pleurisy. GI: Denies vomiting blood, jaundice, and fecal incontinence.    Derm: Denies rash, itching, dry skin, hives, moles, warts, or unhealing ulcers.  Psych: Denies  depression, anxiety, memory loss, suicidal ideation, hallucinations, paranoia, and confusion. Heme: Denies bruising, bleeding, and enlarged lymph nodes.  Physical Exam: BP 133/79  Pulse 68  Temp(Src) 98 F (36.7 C) (Temporal)  Ht 5\' 7"  (1.702 m)  Wt 180 lb 9.6 oz (81.92 kg)  BMI 28.29 kg/m2 General:   Alert,  Well-developed, well-nourished, pleasant and cooperative in NAD Head:  Normocephalic and atraumatic. Eyes:  Sclera clear, no icterus.   Conjunctiva pink. Mouth:  No deformity or lesions, OP pink/moist. Neck:  Supple; no masses or thyromegaly. Heart:  Regular rate and rhythm; no murmurs, clicks, rubs,  or gallops. Abdomen:  Soft, nontender and nondistended. No masses, hepatosplenomegaly or hernias noted. Normal bowel sounds, without guarding, and without rebound.   Msk:  Symmetrical without gross deformities. Normal posture. Pulses:  Normal pulses noted. Extremities:  Without clubbing or edema. Neurologic:  Alert and  oriented x4;  grossly normal neurologically. Skin:  Intact without significant lesions or rashes. Cervical Nodes:  No significant cervical adenopathy. Psych:  Alert and cooperative. Normal mood and affect.

## 2011-06-23 NOTE — Patient Instructions (Signed)
Increase Aciphex to 20mg  twice per day Call me in 2 weeks & let me know if you're no better Begin ALIGN or probiotic of choice daily  Gas  (Flatulence) Burping releases air that you swallowed. The bubbles from some drinks may cause burps. There are good bacteria in your gut to help you digest food. Gas is produced by these bacteria and released from your bottom. Most people release 3 to 4 quarts of gas every day. This is normal. HOME CARE  Eat or drink less of the foods or liquids that give you gas.   Foods that give you gas may be added to your diet a little at a time.   Take the time to chew your food well. Talk less while you eat.   Do not suck on ice or hard candy.   Sip slowly. Stir some of the bubbles out of fizzy drinks with a spoon or straw.   Chewing gum or smoking may cause you to swallow more air.   Ask about liquids and tablets that may help control burping and gas.   Only take medicine as directed by your doctor.  GET HELP RIGHT AWAY IF:  There is discomfort when you burp or pass gas.   Your throw up (vomit) comes up when you burp.   Poop (stool) comes out when you pass gas.   Your belly is swollen and hard.  MAKE SURE YOU:   Understand these instructions.   Will watch your condition.   Will get help right away if you are not doing well or get worse.  Document Released: 07/18/2008 Document Re-Released: 07/13/2009 [facility] Patient Information 2011 [place], [place].

## 2011-06-24 ENCOUNTER — Encounter: Payer: Self-pay | Admitting: Urgent Care

## 2011-06-24 NOTE — Assessment & Plan Note (Addendum)
Poorly controlled on daily PPI.  C/o Belching, gas & chest pain.  Trial increasing Aciphex to 20mg  BID.  Differentials include refractory GERD, non-acid bile reflux, gastroparesis, or non-GI source of chest pain.  Increase Aciphex to 20mg  twice per day Call me in 2 weeks & let me know if you're no better Begin ALIGN or probiotic of choice daily

## 2011-06-24 NOTE — Progress Notes (Signed)
Cc to PCP 

## 2011-06-24 NOTE — Assessment & Plan Note (Signed)
IBS-D at baseline.  C/o bloating & belching.  Add probiotic Restora (2 boxes samples given) or Align daily.  OV in 3 mo or sooner if no better.

## 2011-07-07 ENCOUNTER — Telehealth: Payer: Self-pay

## 2011-07-07 NOTE — Telephone Encounter (Signed)
Spoke to pt about changing her rx for her Aciphex to generic protonix. Pt said it was ok to change. [facility]

## 2011-07-07 NOTE — Telephone Encounter (Signed)
She was returning your call from Friday. She will be home most of the date today. Her number is [phone].

## 2011-09-19 ENCOUNTER — Ambulatory Visit: Payer: BC Managed Care – PPO | Admitting: Internal Medicine

## 2011-10-01 ENCOUNTER — Encounter: Payer: Self-pay | Admitting: Internal Medicine

## 2011-10-06 ENCOUNTER — Encounter: Payer: Self-pay | Admitting: Gastroenterology

## 2011-10-06 ENCOUNTER — Ambulatory Visit (INDEPENDENT_AMBULATORY_CARE_PROVIDER_SITE_OTHER): Payer: BC Managed Care – PPO | Admitting: Gastroenterology

## 2011-10-06 VITALS — BP 106/63 | HR 72 | Temp 98.6°F | Ht 67.0 in | Wt 182.2 lb

## 2011-10-06 DIAGNOSIS — K589 Irritable bowel syndrome without diarrhea: Secondary | ICD-10-CM

## 2011-10-06 DIAGNOSIS — K219 Gastro-esophageal reflux disease without esophagitis: Secondary | ICD-10-CM

## 2011-10-06 MED ORDER — DICYCLOMINE HCL 10 MG PO CAPS: 10.0000 mg | ORAL_CAPSULE | Freq: Three times a day (TID) | ORAL | Status: DC | PRN

## 2011-10-06 NOTE — Patient Instructions (Addendum)
Taking a probiotic daily. The one you have purchased is fine (Digestive Advantage). We also like Align, Restora, [employer]. Office visit in one year. Your next colonoscopy should be in 11/2020.  Irritable Bowel Syndrome Irritable Bowel Syndrome (IBS) is caused by a disturbance of normal bowel function. Other terms used are spastic colon, mucous colitis, and irritable colon. It does not require surgery, nor does it lead to cancer. There is no cure for IBS. But with proper diet, stress reduction, and medication, you will find that your problems (symptoms) will gradually disappear or improve. IBS is a common digestive disorder. It usually appears in late adolescence or early adulthood. Women develop it twice as often as men. CAUSES  After food has been digested and absorbed in the small intestine, waste material is moved into the colon (large intestine). In the colon, water and salts are absorbed from the undigested products coming from the small intestine. The remaining residue, or fecal material, is held for elimination. Under normal circumstances, gentle, rhythmic contractions on the bowel walls push the fecal material along the colon towards the rectum. In IBS, however, these contractions are irregular and poorly coordinated. The fecal material is either retained too long, resulting in constipation, or expelled too soon, producing diarrhea. SYMPTOMS  The most common symptom of IBS is pain. It is typically in the lower left side of the belly (abdomen). But it may occur anywhere in the abdomen. It can be felt as heartburn, backache, or even as a dull pain in the arms or shoulders. The pain comes from excessive bowel-muscle spasms and from the buildup of gas and fecal material in the colon. This pain:  Can range from sharp belly (abdominal) cramps to a dull, continuous ache.   Usually worsens soon after eating.   Is typically relieved by having a bowel movement or passing gas.  Abdominal pain  is usually accompanied by constipation. But it may also produce diarrhea. The diarrhea typically occurs right after a meal or upon arising in the morning. The stools are typically soft and watery. They are often flecked with secretions (mucus). Other symptoms of IBS include:  Bloating.   Loss of appetite.   Heartburn.   Feeling sick to your stomach (nausea).   Belching   Vomiting   Gas.  IBS may also cause a number of symptoms that are unrelated to the digestive system:  Fatigue.   Headaches.   Anxiety   Shortness of breath   Difficulty in concentrating.   Dizziness.  These symptoms tend to come and go. DIAGNOSIS  The symptoms of IBS closely mimic the symptoms of other, more serious digestive disorders. So your caregiver may wish to perform a variety of additional tests to exclude these disorders. He/she wants to be certain of learning what is wrong (diagnosis). The nature and purpose of each test will be explained to you. TREATMENT A number of medications are available to help correct bowel function and/or relieve bowel spasms and abdominal pain. Among the drugs available are:  Mild, non-irritating laxatives for severe constipation and to help restore normal bowel habits.   Specific anti-diarrheal medications to treat severe or prolonged diarrhea.   Anti-spasmodic agents to relieve intestinal cramps.   Your caregiver may also decide to treat you with a mild tranquilizer or sedative during unusually stressful periods in your life.  The important thing to remember is that if any drug is prescribed for you, make sure that you take it exactly as directed. Make sure  that your caregiver knows how well it worked for you. HOME CARE INSTRUCTIONS   Avoid foods that are high in fat or oils. Some examples [id number] cream, butter, frankfurters, sausage, and other fatty meats.   Avoid foods that have a laxative effect, such as fruit, fruit juice, and dairy products.   Cut out  carbonated drinks, chewing gum, and "gassy" foods, such as beans and cabbage. This may help relieve bloating and belching.   Bran taken with plenty of liquids may help relieve constipation.   Keep track of what foods seem to trigger your symptoms.   Avoid emotionally charged situations or circumstances that produce anxiety.   Start or continue exercising.   Get plenty of rest and sleep.  MAKE SURE YOU:   Understand these instructions.   Will watch your condition.   Will get help right away if you are not doing well or get worse.  Document Released: 09/15/2005 Document Revised: 05/28/2011 Document Reviewed: 05/05/2008 [facility] Patient Information 2012 [place], [place].    Diet for GERD or PUD Nutrition therapy can help ease the discomfort of gastroesophageal reflux disease (GERD) and peptic ulcer disease (PUD).  HOME CARE INSTRUCTIONS   Eat your meals slowly, in a relaxed setting.   Eat 5 to 6 small meals per day.   If a food causes distress, stop eating it for a period of time.  FOODS TO AVOID  Coffee, regular or decaffeinated.   Cola beverages, regular or low calorie.   Tea, regular or decaffeinated.   Pepper.   Cocoa.   High fat foods, including meats.   Butter, margarine, hydrogenated oil (trans fats).   Peppermint or spearmint (if you have GERD).   Fruits and vegetables if not tolerated.   Alcohol.   Nicotine (smoking or chewing). This is one of the most potent stimulants to acid production in the gastrointestinal tract.   Any food that seems to aggravate your condition.  If you have questions regarding your diet, ask your caregiver or a registered dietitian. TIPS  Lying flat may make symptoms worse. Keep the head of your bed raised 6 to 9 inches (15 to 23 cm) by using a foam wedge or blocks under the legs of the bed.   Do not lay down until 3 hours after eating a meal.   Daily physical activity may help reduce symptoms.  MAKE SURE YOU:    Understand these instructions.   Will watch your condition.   Will get help right away if you are not doing well or get worse.  Document Released: 09/15/2005 Document Revised: 05/28/2011 Document Reviewed: 01/29/2009 [facility] Patient Information 2012 [place], [place].

## 2011-10-06 NOTE — Assessment & Plan Note (Signed)
Doing well on pantoprazole 40 mg daily. Occasionally takes additional dose at bedtime. If she requires more frequent nighttime dose, she will let us know. Anti-reflex measures reinforced. Handout provided.

## 2011-10-06 NOTE — Assessment & Plan Note (Signed)
Recent flare of irritable bowel syndrome likely due to prednisone, Z-Pak, upper respiratory illness. Begin probiotic one daily. She purchased digestive advantage, formally Sustenex. She may take Bentyl as needed for abdominal cramps. Limit dose as much as possible as this can increase reflux symptoms. Prescription sent to [facility] in [place]. Office visit in one year or sooner if needed.

## 2011-10-06 NOTE — Progress Notes (Signed)
Cc to PCP 

## 2011-10-06 NOTE — Progress Notes (Signed)
Primary Care Physician: Alinda Deem, MD  Primary Gastroenterologist:  Roetta Sessions, MD  Chief Complaint  Patient presents with  . Follow-up    IBS    HPI: Caitlin Tanner is a 61 y.o. female here for followup of irritable bowel syndrome and gastroesophageal reflux disease.sister last office visit her insurance company requested she switch from AcipHex to pantoprazole. She was on AcipHex 20 mg twice daily. Recently started pantoprazole and seems to be doing well. Taking 40 mg daily.  08/2011 she had upper respiratory infection. She was given 10 days of prednisone and Zpak. States it really messed her stomach. Was having increased stools, abdominal cramps. She developed oral candidiasis as well. Currently having 1-3 BMs daily, stools semiformed. Denies blood in the stool or melena. Complains of ongoing crampy abdominal pain especially postprandial. Takes Pepto or TUMS. Previously did well on Bentyl and wants to resume. Levsin caused significant dry mouth in the past.   Current Outpatient Prescriptions  Medication Sig Dispense Refill  . aspirin 81 MG tablet Take 81 mg by mouth daily.        . Calcium Carbonate-Vitamin D (CALTRATE 600+D) 600-400 MG-UNIT per tablet Take 1 tablet by mouth 2 (two) times daily.        . cetirizine (ZYRTEC) 5 MG tablet Take 5 mg by mouth daily.        Marland Kitchen escitalopram (LEXAPRO) 20 MG tablet Take 10 mg by mouth daily.       Marland Kitchen ezetimibe (ZETIA) 10 MG tablet Take 10 mg by mouth daily.        Marland Kitchen LORazepam (ATIVAN) 0.5 MG tablet Take 0.5 mg by mouth as needed.        Marland Kitchen losartan-hydrochlorothiazide (HYZAAR) 50-12.5 MG per tablet Take 1 tablet by mouth daily.        . metoprolol (TOPROL-XL) 50 MG 24 hr tablet Take 50 mg by mouth daily.        . mometasone (NASONEX) 50 MCG/ACT nasal spray Place 2 sprays into the nose daily.        . multivitamin (THERAGRAN) per tablet Take 1 tablet by mouth daily.        . Omega-3 Fatty Acids (FISH OIL) 1000 MG CAPS Take by mouth daily.         . pantoprazole (PROTONIX) 40 MG tablet Take 40 mg by mouth daily.        . Probiotic Product (PROBIOTIC FORMULA) CAPS Take 1 capsule by mouth daily.              2    Allergies as of 10/06/2011 - Review Complete 10/06/2011  Allergen Reaction Noted  . Guaifenesin    . Ketorolac tromethamine    . Prempro    . Tramadol hcl      ROS:  General: Negative for anorexia, weight loss, fever, chills, fatigue, weakness. ENT: Negative for hoarseness, difficulty swallowing , nasal congestion. CV: Negative for chest pain, angina, palpitations, dyspnea on exertion, peripheral edema.  Respiratory: Negative for dyspnea at rest, dyspnea on exertion, cough, sputum, wheezing.  GI: See history of present illness. GU:  Negative for dysuria, hematuria, urinary incontinence, urinary frequency, nocturnal urination.  Endo: Negative for unusual weight change.    Physical Examination:   BP 106/63  Pulse 72  Temp(Src) 98.6 F (37 C) (Temporal)  Ht 5\' 7"  (1.702 m)  Wt 182 lb 3.2 oz (82.645 kg)  BMI 28.54 kg/m2  General: Well-nourished, well-developed in no acute distress.  Eyes: No icterus. Mouth: Oropharyngeal  mucosa moist and pink , no lesions erythema or exudate. Lungs: Clear to auscultation bilaterally.  Heart: Regular rate and rhythm, no murmurs rubs or gallops.  Abdomen: Bowel sounds are normal, nontender, nondistended, no hepatosplenomegaly or masses, no abdominal bruits or hernia , no rebound or guarding.   Extremities: No lower extremity edema. No clubbing or deformities. Neuro: Alert and oriented x 4   Skin: Warm and dry, no jaundice.   Psych: Alert and cooperative, normal mood and affect.

## 2011-10-22 ENCOUNTER — Ambulatory Visit (INDEPENDENT_AMBULATORY_CARE_PROVIDER_SITE_OTHER): Payer: BC Managed Care – PPO | Admitting: Gastroenterology

## 2011-10-22 ENCOUNTER — Encounter: Payer: Self-pay | Admitting: Gastroenterology

## 2011-10-22 DIAGNOSIS — K589 Irritable bowel syndrome without diarrhea: Secondary | ICD-10-CM

## 2011-10-22 DIAGNOSIS — R1032 Left lower quadrant pain: Secondary | ICD-10-CM

## 2011-10-22 DIAGNOSIS — R197 Diarrhea, unspecified: Secondary | ICD-10-CM

## 2011-10-22 DIAGNOSIS — K219 Gastro-esophageal reflux disease without esophagitis: Secondary | ICD-10-CM

## 2011-10-22 DIAGNOSIS — R110 Nausea: Secondary | ICD-10-CM

## 2011-10-22 LAB — CBC WITH DIFFERENTIAL/PLATELET
Basophils Absolute: 0.1 10*3/uL (ref 0.0–0.1)
Basophils Relative: 1 % (ref 0–1)
Eosinophils Absolute: 0.1 10*3/uL (ref 0.0–0.7)
Eosinophils Relative: 1 % (ref 0–5)
HCT: 42.1 % (ref 36.0–46.0)
Hemoglobin: 14.1 g/dL (ref 12.0–15.0)
Lymphocytes Relative: 36 % (ref 12–46)
Lymphs Abs: 2 10*3/uL (ref 0.7–4.0)
MCH: 30.9 pg (ref 26.0–34.0)
MCHC: 33.5 g/dL (ref 30.0–36.0)
MCV: 92.1 fL (ref 78.0–100.0)
Monocytes Absolute: 0.6 10*3/uL (ref 0.1–1.0)
Monocytes Relative: 10 % (ref 3–12)
Neutro Abs: 2.9 10*3/uL (ref 1.7–7.7)
Neutrophils Relative %: 52 % (ref 43–77)
Platelets: 195 10*3/uL (ref 150–400)
RBC: 4.57 MIL/uL (ref 3.87–5.11)
RDW: 13.2 % (ref 11.5–15.5)
WBC: 5.6 10*3/uL (ref 4.0–10.5)

## 2011-10-22 LAB — COMPREHENSIVE METABOLIC PANEL
ALT: 27 U/L (ref 0–35)
AST: 21 U/L (ref 0–37)
Albumin: 4.6 g/dL (ref 3.5–5.2)
Alkaline Phosphatase: 71 U/L (ref 39–117)
BUN: 22 mg/dL (ref 6–23)
CO2: 29 mEq/L (ref 19–32)
Calcium: 9.7 mg/dL (ref 8.4–10.5)
Chloride: 103 mEq/L (ref 96–112)
Creat: 0.9 mg/dL (ref 0.50–1.10)
Glucose, Bld: 107 mg/dL — ABNORMAL HIGH (ref 70–99)
Potassium: 4.1 mEq/L (ref 3.5–5.3)
Sodium: 140 mEq/L (ref 135–145)
Total Bilirubin: 0.8 mg/dL (ref 0.3–1.2)
Total Protein: 6.6 g/dL (ref 6.0–8.3)

## 2011-10-22 LAB — TSH: TSH: 1.474 u[IU]/mL (ref 0.350–4.500)

## 2011-10-22 MED ORDER — INULIN 2 G PO CHEW: 2.0000 | CHEWABLE_TABLET | Freq: Every day | ORAL | Status: DC

## 2011-10-22 MED ORDER — HYOSCYAMINE SULFATE ER 0.375 MG PO TB12: 0.3750 mg | ORAL_TABLET | Freq: Two times a day (BID) | ORAL | Status: DC | PRN

## 2011-10-22 NOTE — Patient Instructions (Signed)
Stop Bentyl since it is sedating you.  Try Levbid for abdominal pain, uneasy stomach, diarrhea. Hold for constipation. You may take twice daily. Continue probiotics, one daily for total of four weeks. Add Fiberchoice two chewable tablets daily.  Call if your symptoms do not improve. If persistent abdominal pain, we may consider CT scan of your abdomen.   Irritable Bowel Syndrome Irritable Bowel Syndrome (IBS) is caused by a disturbance of normal bowel function. Other terms used are spastic colon, mucous colitis, and irritable colon. It does not require surgery, nor does it lead to cancer. There is no cure for IBS. But with proper diet, stress reduction, and medication, you will find that your problems (symptoms) will gradually disappear or improve. IBS is a common digestive disorder. It usually appears in late adolescence or early adulthood. Women develop it twice as often as men. CAUSES  After food has been digested and absorbed in the small intestine, waste material is moved into the colon (large intestine). In the colon, water and salts are absorbed from the undigested products coming from the small intestine. The remaining residue, or fecal material, is held for elimination. Under normal circumstances, gentle, rhythmic contractions on the bowel walls push the fecal material along the colon towards the rectum. In IBS, however, these contractions are irregular and poorly coordinated. The fecal material is either retained too long, resulting in constipation, or expelled too soon, producing diarrhea. SYMPTOMS  The most common symptom of IBS is pain. It is typically in the lower left side of the belly (abdomen). But it may occur anywhere in the abdomen. It can be felt as heartburn, backache, or even as a dull pain in the arms or shoulders. The pain comes from excessive bowel-muscle spasms and from the buildup of gas and fecal material in the colon. This pain:  Can range from sharp belly (abdominal)  cramps to a dull, continuous ache.   Usually worsens soon after eating.   Is typically relieved by having a bowel movement or passing gas.  Abdominal pain is usually accompanied by constipation. But it may also produce diarrhea. The diarrhea typically occurs right after a meal or upon arising in the morning. The stools are typically soft and watery. They are often flecked with secretions (mucus). Other symptoms of IBS include:  Bloating.   Loss of appetite.   Heartburn.   Feeling sick to your stomach (nausea).   Belching   Vomiting   Gas.  IBS may also cause a number of symptoms that are unrelated to the digestive system:  Fatigue.   Headaches.   Anxiety   Shortness of breath   Difficulty in concentrating.   Dizziness.  These symptoms tend to come and go. DIAGNOSIS  The symptoms of IBS closely mimic the symptoms of other, more serious digestive disorders. So your caregiver may wish to perform a variety of additional tests to exclude these disorders. He/she wants to be certain of learning what is wrong (diagnosis). The nature and purpose of each test will be explained to you. TREATMENT A number of medications are available to help correct bowel function and/or relieve bowel spasms and abdominal pain. Among the drugs available are:  Mild, non-irritating laxatives for severe constipation and to help restore normal bowel habits.   Specific anti-diarrheal medications to treat severe or prolonged diarrhea.   Anti-spasmodic agents to relieve intestinal cramps.   Your caregiver may also decide to treat you with a mild tranquilizer or sedative during unusually stressful periods in your  life.  The important thing to remember is that if any drug is prescribed for you, make sure that you take it exactly as directed. Make sure that your caregiver knows how well it worked for you. HOME CARE INSTRUCTIONS   Avoid foods that are high in fat or oils. Some examples ZOX:[id number] cream,  butter, frankfurters, sausage, and other fatty meats.   Avoid foods that have a laxative effect, such as fruit, fruit juice, and dairy products.   Cut out carbonated drinks, chewing gum, and "gassy" foods, such as beans and cabbage. This may help relieve bloating and belching.   Bran taken with plenty of liquids may help relieve constipation.   Keep track of what foods seem to trigger your symptoms.   Avoid emotionally charged situations or circumstances that produce anxiety.   Start or continue exercising.   Get plenty of rest and sleep.  MAKE SURE YOU:   Understand these instructions.   Will watch your condition.   Will get help right away if you are not doing well or get worse.  Document Released: 09/15/2005 Document Revised: 05/28/2011 Document Reviewed: 05/05/2008 [facility] Patient Information 2012 [place], [place].

## 2011-10-22 NOTE — Assessment & Plan Note (Signed)
Likely due to irritable bowel syndrome. IBS education provided to the patient. If she develops more persistent pain, fever she should let us know. She does have a history of diverticulosis therefore is at risk for diverticulitis. Add fiber. If persisting pain despite medication changes for IBS she'll let me know and at that point we would consider a CT scan.

## 2011-10-22 NOTE — Progress Notes (Signed)
Cc to PCP 

## 2011-10-22 NOTE — Assessment & Plan Note (Addendum)
Onset of stomach, nausea, suspect ongoing flare of IBS. Bentyl was too sedating therefore she did not take it enough to see if it would help. Tried Levbid one by mouth twice a day when necessary. Hold for constipation. Continue probiotics for 4 weeks. Samples provided. Add fiber choice 2 chewable tablets daily. Check labs for celiac, thyroid, cmet, CBC.

## 2011-10-22 NOTE — Progress Notes (Signed)
Primary Care Physician: Alinda Deem, MD, MD  Primary Gastroenterologist:  Roetta Sessions, MD   Chief Complaint  Patient presents with  . Nausea    just a sick feeling but not all the time    HPI: Caitlin Tanner is a 61 y.o. female here with persistent stomach upset. Last seen 2 weeks ago. History of GERD and irritable bowel syndrome. Symptoms aggravated back in December when taking prednisone and a Z-Pak.  Stomach feels unsettled. Lots of belching, but no burning. She took a couple weeks of Restora and tried the Bentyl couple of times but nothing seemed to help. Bentyl caused her to be sedated so she only can take it at nighttime. Bowel movements have decreased. She may have diarrhea one day but skipped 2 days without a bowel movement now. Occasional bright red blood per rectum on toilet tissue. Nausea no vomiting. No dysuria. LLQ intermittent, most days. Nagging pain. No fever.  Current Outpatient Prescriptions  Medication Sig Dispense Refill  . aspirin 81 MG tablet Take 81 mg by mouth daily.        . Calcium Carbonate-Vitamin D (CALTRATE 600+D) 600-400 MG-UNIT per tablet Take 1 tablet by mouth 2 (two) times daily.        . cetirizine (ZYRTEC) 5 MG tablet Take 5 mg by mouth daily.        Marland Kitchen dicyclomine (BENTYL) 10 MG capsule Take 1 capsule (10 mg total) by mouth 3 (three) times daily with meals as needed.  90 capsule  2  . escitalopram (LEXAPRO) 20 MG tablet Take 10 mg by mouth daily.       Marland Kitchen ezetimibe (ZETIA) 10 MG tablet Take 10 mg by mouth daily.        Marland Kitchen LORazepam (ATIVAN) 0.5 MG tablet Take 0.5 mg by mouth as needed.        Marland Kitchen losartan-hydrochlorothiazide (HYZAAR) 50-12.5 MG per tablet Take 1 tablet by mouth daily.        . metoprolol (TOPROL-XL) 50 MG 24 hr tablet Take 50 mg by mouth daily.        . mometasone (NASONEX) 50 MCG/ACT nasal spray Place 2 sprays into the nose daily.        . multivitamin (THERAGRAN) per tablet Take 1 tablet by mouth daily.        . Omega-3 Fatty Acids  (FISH OIL) 1000 MG CAPS Take by mouth daily.        . pantoprazole (PROTONIX) 40 MG tablet Take 40 mg by mouth daily.        . Probiotic Product (PROBIOTIC FORMULA) CAPS Take 1 capsule by mouth daily.        Marland Kitchen QVAR 80 MCG/ACT inhaler Inhale 1 puff into the lungs 2 (two) times daily.         Allergies as of 10/22/2011 - Review Complete 10/22/2011  Allergen Reaction Noted  . Guaifenesin    . Ketorolac tromethamine    . Prempro    . Tramadol hcl      ROS:  General: Negative for anorexia, weight loss, fever, chills, fatigue, weakness. ENT: Negative for hoarseness, difficulty swallowing , nasal congestion. CV: Negative for chest pain, angina, palpitations, dyspnea on exertion, peripheral edema.  Respiratory: Negative for dyspnea at rest, dyspnea on exertion, cough, sputum, wheezing.  GI: See history of present illness. GU:  Negative for dysuria, hematuria, urinary incontinence, urinary frequency, nocturnal urination.  Endo: Negative for unusual weight change.    Physical Examination:   BP 127/73  Pulse  68  Temp(Src) 98.1 F (36.7 C) (Temporal)  Ht 5\' 7"  (1.702 m)  Wt 179 lb 3.2 oz (81.285 kg)  BMI 28.07 kg/m2  General: Well-nourished, well-developed in no acute distress.  Eyes: No icterus. Mouth: Oropharyngeal mucosa moist and pink , no lesions erythema or exudate. Lungs: Clear to auscultation bilaterally.  Heart: Regular rate and rhythm, no murmurs rubs or gallops.  Abdomen: Bowel sounds are normal, nontender, nondistended, no hepatosplenomegaly or masses, no abdominal bruits or hernia , no rebound or guarding.   Extremities: No lower extremity edema. No clubbing or deformities. Neuro: Alert and oriented x 4   Skin: Warm and dry, no jaundice.   Psych: Alert and cooperative, normal mood and affect.

## 2011-10-22 NOTE — Assessment & Plan Note (Signed)
Some belching but no heartburn. Continue Protonix.

## 2011-10-23 ENCOUNTER — Telehealth: Payer: Self-pay | Admitting: Gastroenterology

## 2011-10-23 LAB — TISSUE TRANSGLUTAMINASE, IGA: Tissue Transglutaminase Ab, IgA: 1.4 U/mL (ref <20)

## 2011-10-23 NOTE — Telephone Encounter (Signed)
Pt called asking to speak to LL- she woke up this morning and can barely talk- she thinks it is coming from the new med LL put her on yesterday- please call pt @ [phone]

## 2011-10-23 NOTE — Telephone Encounter (Signed)
Spoke with pt- she said she only took 1 dose of Levbid last night and woke up this morning with a dry mouth, hoarseness and a sore throat. She has been using cough drops all morning and the soreness has gone away but she is still hoarse. Pt thinks it is coming from the medication.   Do you have any recommendations?

## 2011-10-23 NOTE — Telephone Encounter (Signed)
Hard to say symptoms came from one dose of levbid. Options are to try 1/2 dose of levbid versus stop medication and see how she does on fiberchoice that was added yesterday. I would advise trying 1/2 dose of levbid before stopping all together. Please let her know her labs looked good.   She should keep [place] informed.

## 2011-10-27 NOTE — Progress Notes (Signed)
Quick Note:  Pt aware ______ 

## 2011-10-27 NOTE — Progress Notes (Signed)
Quick Note:  Please let pt know, labs look good. ______

## 2011-10-27 NOTE — Telephone Encounter (Signed)
Pt aware, she said she didn't take it at all on Fri or Sat but she did take it on Sunday and woke up this morning with a dry mouth but no hoarseness. Advised pt to take 1/2 dose. She stated she will try it and let us know how she does.

## 2011-10-27 NOTE — Progress Notes (Signed)
Quick Note:  Tried to call pt- LMOM ______ 

## 2011-10-27 NOTE — Telephone Encounter (Signed)
Tried to call pt- LMOM 

## 2011-10-28 NOTE — Telephone Encounter (Signed)
Noted  

## 2011-11-03 ENCOUNTER — Telehealth: Payer: Self-pay | Admitting: Gastroenterology

## 2011-11-03 NOTE — Telephone Encounter (Signed)
Pt called stating she is having a lot of burping and soreness in her chest- wants to know if the RESTORA is causing it- please call on cell [phone]

## 2011-11-04 ENCOUNTER — Telehealth: Payer: Self-pay | Admitting: Internal Medicine

## 2011-11-04 NOTE — Telephone Encounter (Signed)
Spoke with pt- see separate phone note. 

## 2011-11-04 NOTE — Telephone Encounter (Signed)
Pt called today asking if LSL had gotten her message from yesterday. I told her that message was sent to LSL, but she has been seeing patients all morning. She asked for someone to call her on her cell since she will be out today. She also said she has nervousness and takes Ativan once a day. Pleas advise if patient needs as OV.

## 2011-11-04 NOTE — Telephone Encounter (Signed)
Spoke with pt- she is having a lot of burping but the soreness in her chest is better and her stomach feels better. She is not taking the levbid because she feels like it made her feel worse. She does have a "shaky" feeling in her stomach at times but it doesn't last long. Pt is currently taking protonix but she has tried prilosec, nexium and aciphex. She feels like her symptoms started when her insurance made her switch to protonix. She was doing well on aciphex. Please advise next step.

## 2011-11-05 NOTE — Telephone Encounter (Signed)
Let's give her samples of Aciphex 20mg  once to twice daily. #20. If things settle down, then we can try to get insurance to pay for it.

## 2011-11-06 NOTE — Telephone Encounter (Signed)
Pt aware, aciphex samples at front desk for her to pick up

## 2011-11-17 NOTE — Telephone Encounter (Signed)
Pt is aware of OV on 2/25 at 0930 with LSL

## 2011-11-24 ENCOUNTER — Encounter: Payer: Self-pay | Admitting: Gastroenterology

## 2011-11-24 ENCOUNTER — Ambulatory Visit (INDEPENDENT_AMBULATORY_CARE_PROVIDER_SITE_OTHER): Payer: BC Managed Care – PPO | Admitting: Gastroenterology

## 2011-11-24 DIAGNOSIS — R140 Abdominal distension (gaseous): Secondary | ICD-10-CM | POA: Insufficient documentation

## 2011-11-24 DIAGNOSIS — R141 Gas pain: Secondary | ICD-10-CM

## 2011-11-24 DIAGNOSIS — K589 Irritable bowel syndrome without diarrhea: Secondary | ICD-10-CM

## 2011-11-24 DIAGNOSIS — R1032 Left lower quadrant pain: Secondary | ICD-10-CM

## 2011-11-24 DIAGNOSIS — R143 Flatulence: Secondary | ICD-10-CM

## 2011-11-24 DIAGNOSIS — R142 Eructation: Secondary | ICD-10-CM

## 2011-11-24 DIAGNOSIS — K219 Gastro-esophageal reflux disease without esophagitis: Secondary | ICD-10-CM

## 2011-11-24 MED ORDER — RABEPRAZOLE SODIUM 20 MG PO TBEC: 20.0000 mg | DELAYED_RELEASE_TABLET | Freq: Every day | ORAL | Status: DC

## 2011-11-24 NOTE — Assessment & Plan Note (Signed)
Persistent abdominal bloating with a left lower quadrant tenderness. None of the trials of different medications have seemed to help with her symptoms. We have been treating her for irritable bowel syndrome. Reassuringly she's had a colonoscopy and upper endoscopy in the last 12 months. At this point in time I have offered her a CT of the abdomen pelvis with contrast to further evaluate her symptoms.

## 2011-11-24 NOTE — Assessment & Plan Note (Signed)
Stop levbid for now.

## 2011-11-24 NOTE — Patient Instructions (Signed)
Please have your CT scan done as scheduled. Take pantoprazole in the morning before breakfast and take Aciphex at bedtime for next two weeks. Please mention your chest soreness at your appointment tomorrow.

## 2011-11-24 NOTE — Progress Notes (Signed)
Primary Care Physician: Alinda Deem, MD, MD  Primary Gastroenterologist:  Roetta Sessions, MD   Chief Complaint  Patient presents with  . Follow-up    trouble going to the bathroom    HPI: Caitlin Tanner is a 62 y.o. female here for followup. She was last seen on 10/22/2011 for persistent complaints of nausea, bloating, lower abdominal pain. She has had 3 office visits this year thus far. She has a history of chronic GERD and irritable bowel syndrome. She also has diverticulosis. We have tried multiple medications for management of her symptoms. She did not tolerate Bentyl due to sedation. She was only able to tolerate half a tablet of Levbid daily due to extreme hoarseness and dry mouth. She completed a course of Restora without significant improvement in her abdominal bloating. She recently started Digestive Advantage. Initially she felt that her symptoms were more prevalent after being forced to switch to pantoprazole from AcipHex by insurance company. She was also on prednisone and Z-Pak in December. More recently we gave her samples of AcipHex but she tells me her reflux symptoms are essentially unchanged.  She normally has a tendency towards diarrhea. However 10 days ago she developed constipation. She did not have a bowel movement for 5 days. The following day she developed profuse diarrhea and vomiting which lasted for one day. Since then she is having 1-2 normal stools daily. Still feels bloated. Still with intermittent left lower quadrant pain. Occasional rare blood per rectum. No melena or profuse bleeding. Denies heartburn. Complains of excessive belching. Complains of chest soreness. She tells me she had a stress test and echocardiogram recently was told everything was normal. She has had recent PFTs. She follows with her allergy/asthma specialist tomorrow. Denies any coughing or shortness of breath. States she feels sore in her chest and this is not aggravated by meals or breathing or  movement. Tylenol helps the pain. Denies fever or chills.   Current Outpatient Prescriptions  Medication Sig Dispense Refill  . aspirin 81 MG tablet Take 81 mg by mouth daily.        . Calcium Carbonate-Vitamin D (CALTRATE 600+D) 600-400 MG-UNIT per tablet Take 1 tablet by mouth 2 (two) times daily.        . cetirizine (ZYRTEC) 5 MG tablet Take 5 mg by mouth daily.        Marland Kitchen escitalopram (LEXAPRO) 20 MG tablet Take 10 mg by mouth daily.       Marland Kitchen ezetimibe (ZETIA) 10 MG tablet Take 10 mg by mouth daily.        Marland Kitchen LORazepam (ATIVAN) 0.5 MG tablet Take 0.5 mg by mouth as needed.        Marland Kitchen losartan-hydrochlorothiazide (HYZAAR) 50-12.5 MG per tablet Take 1 tablet by mouth daily.        . metoprolol (TOPROL-XL) 50 MG 24 hr tablet Take 50 mg by mouth daily.        . mometasone (NASONEX) 50 MCG/ACT nasal spray Place 2 sprays into the nose daily.        . multivitamin (THERAGRAN) per tablet Take 1 tablet by mouth daily.        . Omega-3 Fatty Acids (FISH OIL) 1000 MG CAPS Take by mouth daily.        . pantoprazole (PROTONIX) 40 MG tablet Take 40 mg by mouth daily.        Marland Kitchen QVAR 80 MCG/ACT inhaler Inhale 1 puff into the lungs 2 (two) times daily.       Marland Kitchen  RABEprazole (ACIPHEX) 20 MG tablet Take 1 tablet (20 mg total) by mouth at bedtime.  14 tablet  0    Allergies as of 11/24/2011 - Review Complete 11/24/2011  Allergen Reaction Noted  . Guaifenesin    . Ketorolac tromethamine    . Premphase    . Tramadol hcl      ROS:  General: Negative for anorexia, weight loss, fever, chills, fatigue, weakness. ENT: Some hoarseness. No difficulty swallowing , nasal congestion. CV: Negative for chest pain, angina, palpitations, dyspnea on exertion, peripheral edema.  Respiratory: Negative for dyspnea at rest, dyspnea on exertion, cough, sputum, wheezing.  GI: See history of present illness. GU:  Negative for dysuria, hematuria, urinary incontinence, urinary frequency, nocturnal urination.  Endo: Negative for  unusual weight change.    Physical Examination:   BP 127/78  Pulse 63  Temp(Src) 98.2 F (36.8 C) (Temporal)  Ht 5\' 7"  (1.702 m)  Wt 179 lb 3.2 oz (81.285 kg)  BMI 28.07 kg/m2  General: Well-nourished, well-developed in no acute distress.  Eyes: No icterus. Mouth: Oropharyngeal mucosa moist and pink , no lesions erythema or exudate. Lungs: Clear to auscultation bilaterally.  Heart: Regular rate and rhythm, no murmurs rubs or gallops.  Abdomen: Bowel sounds are normal, mild tenderness throughout, nondistended, no hepatosplenomegaly or masses, no abdominal bruits or hernia , no rebound or guarding.   Extremities: No lower extremity edema. No clubbing or deformities. Neuro: Alert and oriented x 4   Skin: Warm and dry, no jaundice.   Psych: Alert and cooperative, normal mood and affect.  Labs:  Lab Results  Component Value Date   CREATININE 0.90 10/22/2011   BUN 22 10/22/2011   NA 140 10/22/2011   K 4.1 10/22/2011   CL 103 10/22/2011   CO2 29 10/22/2011   Lab Results  Component Value Date   WBC 5.6 10/22/2011   HGB 14.1 10/22/2011   HCT 42.1 10/22/2011   MCV 92.1 10/22/2011   PLT 195 10/22/2011   Lab Results  Component Value Date   ALT 27 10/22/2011   AST 21 10/22/2011   ALKPHOS 71 10/22/2011   BILITOT 0.8 10/22/2011   TTG, IgA: 1.4  Imaging Studies: No results found.

## 2011-11-24 NOTE — Assessment & Plan Note (Signed)
Chest soreness, increased belching. May be unrelated. 2 week trial of twice a day PPI. She is going to go back on pantoprazole which she has at home. I have provided her with samples of AcipHex to take in the evening for the next 2 weeks. She will discuss chest soreness with her allergies/asthma doctor tomorrow. She describes having complete cardiology evaluation recently. In 2 weeks if twice a day PPI has not made a difference in her symptoms, consider trial of anti-inflammatories (ibuprofen or Aleve) for 2 weeks for possible musculoskeletal pain.

## 2011-11-24 NOTE — Progress Notes (Signed)
Faxed to PCP

## 2011-11-25 NOTE — Progress Notes (Signed)
Patient ID: Caitlin Tanner, female   DOB: [birth year], 61 y.o.   MRN: [id number]  Noted. Trial of BID PPI as planned.

## 2011-11-25 NOTE — Progress Notes (Signed)
Spoke with pt- she said she went to Allergy doctor today. Pt stated that he told her that the pain she was having is not coming from her inhaler. He feels like she may have had an infection at some time. He did not give her any abx but he gave her sinulair for the inflammation.

## 2011-12-01 ENCOUNTER — Telehealth: Payer: Self-pay | Admitting: Gastroenterology

## 2011-12-01 NOTE — Telephone Encounter (Signed)
Pt is scheduled for CT on 03/08 @ 10:15- she is aware of instructions including picking up contrast

## 2011-12-05 ENCOUNTER — Ambulatory Visit (HOSPITAL_COMMUNITY)
Admission: RE | Admit: 2011-12-05 | Discharge: 2011-12-05 | Disposition: A | Discharge status: Home / Self Care | Payer: BC Managed Care – PPO | Source: Ambulatory Visit | Attending: Gastroenterology | Admitting: Gastroenterology

## 2011-12-05 DIAGNOSIS — R140 Abdominal distension (gaseous): Secondary | ICD-10-CM

## 2011-12-05 DIAGNOSIS — R1032 Left lower quadrant pain: Secondary | ICD-10-CM | POA: Insufficient documentation

## 2011-12-05 DIAGNOSIS — Q619 Cystic kidney disease, unspecified: Secondary | ICD-10-CM | POA: Insufficient documentation

## 2011-12-05 MED ORDER — IOHEXOL 300 MG/ML  SOLN
100.0000 mL | Freq: Once | INTRAMUSCULAR | Status: AC | PRN
  Administered 2011-12-05: 100 mL via INTRAVENOUS

## 2011-12-08 NOTE — Progress Notes (Signed)
Quick Note:  Nothing to explain symptoms. Several benign renal cysts. ______

## 2011-12-08 NOTE — Progress Notes (Signed)
Quick Note:  Pt aware ______ 

## 2012-05-03 DIAGNOSIS — R109 Unspecified abdominal pain: Secondary | ICD-10-CM

## 2012-05-25 ENCOUNTER — Other Ambulatory Visit: Payer: Self-pay | Admitting: Allergy and Immunology

## 2012-05-25 DIAGNOSIS — J84115 Respiratory bronchiolitis interstitial lung disease: Secondary | ICD-10-CM

## 2012-05-27 ENCOUNTER — Ambulatory Visit (HOSPITAL_COMMUNITY)
Admission: RE | Admit: 2012-05-27 | Discharge: 2012-05-27 | Disposition: A | Discharge status: Home / Self Care | Payer: BC Managed Care – PPO | Source: Ambulatory Visit | Attending: Allergy and Immunology | Admitting: Allergy and Immunology

## 2012-05-27 DIAGNOSIS — J84115 Respiratory bronchiolitis interstitial lung disease: Secondary | ICD-10-CM

## 2012-05-27 DIAGNOSIS — R911 Solitary pulmonary nodule: Secondary | ICD-10-CM | POA: Insufficient documentation

## 2012-05-27 DIAGNOSIS — J8410 Pulmonary fibrosis, unspecified: Secondary | ICD-10-CM | POA: Insufficient documentation

## 2012-09-10 ENCOUNTER — Encounter: Payer: Self-pay | Admitting: *Deleted

## 2013-01-10 ENCOUNTER — Other Ambulatory Visit: Payer: Self-pay | Admitting: Dermatology

## 2013-04-22 ENCOUNTER — Other Ambulatory Visit: Payer: Self-pay | Admitting: Allergy and Immunology

## 2013-04-22 DIAGNOSIS — J984 Other disorders of lung: Secondary | ICD-10-CM

## 2013-04-22 DIAGNOSIS — R911 Solitary pulmonary nodule: Secondary | ICD-10-CM

## 2013-04-26 ENCOUNTER — Ambulatory Visit (HOSPITAL_COMMUNITY)
Admission: RE | Admit: 2013-04-26 | Discharge: 2013-04-26 | Disposition: A | Discharge status: Home / Self Care | Payer: BC Managed Care – PPO | Source: Ambulatory Visit | Attending: Allergy and Immunology | Admitting: Allergy and Immunology

## 2013-04-26 DIAGNOSIS — Z09 Encounter for follow-up examination after completed treatment for conditions other than malignant neoplasm: Secondary | ICD-10-CM | POA: Insufficient documentation

## 2013-04-26 DIAGNOSIS — R911 Solitary pulmonary nodule: Secondary | ICD-10-CM

## 2013-04-26 DIAGNOSIS — J984 Other disorders of lung: Secondary | ICD-10-CM

## 2013-04-26 DIAGNOSIS — R918 Other nonspecific abnormal finding of lung field: Secondary | ICD-10-CM | POA: Insufficient documentation

## 2013-06-07 ENCOUNTER — Ambulatory Visit (INDEPENDENT_AMBULATORY_CARE_PROVIDER_SITE_OTHER): Payer: BC Managed Care – PPO | Admitting: Internal Medicine

## 2013-06-07 ENCOUNTER — Encounter: Payer: Self-pay | Admitting: Internal Medicine

## 2013-06-07 DIAGNOSIS — J45909 Unspecified asthma, uncomplicated: Secondary | ICD-10-CM

## 2013-06-07 DIAGNOSIS — J45998 Other asthma: Secondary | ICD-10-CM

## 2013-06-07 DIAGNOSIS — Z23 Encounter for immunization: Secondary | ICD-10-CM

## 2013-06-07 DIAGNOSIS — R918 Other nonspecific abnormal finding of lung field: Secondary | ICD-10-CM

## 2013-06-07 NOTE — Progress Notes (Signed)
Subjective:    Patient ID: Caitlin Tanner, female    DOB: 06-04-1951, 62 y.o.   MRN: [id number] PCP Alinda Deem, MD Allergist : DR Laurette Schimke   HPI   reports that she quit smoking about 25 years ago. Her smoking use included Cigarettes. She has a .3 pack-year smoking history. She does not have any smokeless tobacco history on file. There is no weight on file to calculate BMI.   IOV 06/07/2013  88 old almost nonsmoking female referred by Dr. Laurette Schimke for question of pulmonary nodules multiple or less than 4 mm.   She is known to have asthma and is followed by Dr. Lucie Leather for the same. Chart review shows that in August 2013 do to respiratory infection flare and also chest x-ray findings suggesting interstitial lung disease changes in the right lung base a CT scan of the chest was done. This was read by Dr Leanna Battles who interpreted the scan as no evidence of interstitial lung disease but some patchy amorphous infiltrates likely postinfectious in the right upper lobe and some scattered pulmonary nodules less than 4 mm and recommended a scan in one year. She then had a followup scan the year later 04/26/2013 and this was interpreted by Dr. Rito Ehrlich and detailed below and there is some concern for new pulmonary nodules. However all these nodules are reported to be less than or equal to 4 mm in size. I have personally review the film and these nodules are so small on the monitor in the clinic but I cannot appreciate these.  According to her history asthma is very well controlled. Chart review shows that in November 2013 FEV1 was 1.4 L/54% predicted. Her last albuterol use was over a month ago. She's alpha-1 antitrypsin and MM type and normal levels. She is mildly elevated IgE; last at 206.   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  Comparison: CT chest dated 05/27/2012  Findings: Numerous small pulmonary nodules bilaterally, including:  --4 mm ground-glass nodule in the left upper lobe (series 3/image  13), new  --3 mm nodule in the anterior right upper lobe (series 3/image 18),  unchanged  --3 mm nodule in the anterior left upper lobe (series 3/image 19),  unchanged  --3 mm nodule in the central right lower lobe (series 3/image 32),  possibly new  --3 mm ground-glass nodule in the left lower lobe (series 3/image  34), possibly new  These findings may be related to reported history of RBILD on the  prior study. Alternatively, they could be  postinfectious/inflammatory possibly related to atypical  mycobacterial infection given mild scarring / nodularity in the  right middle lobe (series 3/image 38).  No pleural effusion or pneumothorax.  The visualized thyroid is unremarkable.  The heart is normal in size. No pericardial effusion.   12 mm precarinal node with preservation of the normal fatty hilum  (series 2/image 24), unchanged. No suspicious axillary  lymphadenopathy.  The visualized upper abdomen is notable for tiny bilateral renal  cysts, some of which are hemorrhagic.  Mild degenerative changes of the visualized thoracolumbar spine.   IMPRESSION:  Numerous small pulmonary nodules bilaterally measuring up to 4 mm,  as  described above, some of which are new.  The overall appearance favors RBILD or possibly a  postinfectious/inflammatory etiology such as atypical mycobacterial  infection.  Given mild interval change, an additional follow-up CT chest is  suggested in 12 months.  Original Report Authenticated By: Charline Bills, M.D.     Past Medical History  Diagnosis Date  . Anxiety disorder   . GERD (gastroesophageal reflux disease)   .  Hypertension   . IBS (irritable bowel syndrome)   . Hemorrhoids   . Asthma   . S/P colonoscopy 12/23/10    Dr Rourk-diverticulosis, anal canal hemorhhoids, anal papilla, random bx benign  . Diverticulosis   . S/P endoscopy 12/23/10    Dr Mancel Parsons, tubular esophagus, sm HH, minimal retained food, bile-stained mucus, otherwise normal,, esophageal bx  benign     Family History  Problem Relation Age of Onset  . Stomach cancer Father 77  . Throat cancer Mother 82  . Diabetes Brother   . Transient ischemic attack Brother      History   Social History  . Marital Status: Married    Spouse Name: N/A    Number of Children: 1  . Years of Education: N/A   Occupational History  .     Social History Main Topics  . Smoking status: Former Smoker -- 0.10 packs/day for 3 years    Types: Cigarettes    Quit date: 09/30/1987  . Smokeless tobacco: Not on file     Comment: pt states she smoked socially  . Alcohol Use: No  . Drug Use: No  . Sexual Activity: Not on file   Other Topics Concern  . Not on file   Social History Narrative  . No narrative on file     Allergies  Allergen Reactions  . Conj Estrog-Medroxyprogest Ace     REACTION: UNKNOWN REACTION  . Guaifenesin     REACTION: UNKNOWN REACTION  . Ketorolac Tromethamine     REACTION: UNKNOWN REACTION  . Tramadol Hcl     REACTION: UNKNOWN REACTION     Outpatient Prescriptions Prior to Visit  Medication Sig Dispense Refill  . aspirin 81 MG tablet Take 81 mg by mouth daily.        . Calcium Carbonate-Vitamin D (CALTRATE 600+D) 600-400 MG-UNIT per tablet Take 1 tablet by mouth 2 (two) times daily.        Marland Kitchen ezetimibe (ZETIA) 10 MG tablet Take 10 mg by mouth daily.        Marland Kitchen LORazepam (ATIVAN) 0.5 MG tablet Take 0.5 mg by mouth as needed.        Marland Kitchen losartan-hydrochlorothiazide (HYZAAR) 50-12.5 MG per tablet Take 1 tablet by mouth daily.        . metoprolol (TOPROL-XL) 50 MG 24 hr tablet Take 50 mg by mouth daily.        .  multivitamin (THERAGRAN) per tablet Take 1 tablet by mouth daily.        . Omega-3 Fatty Acids (FISH OIL) 1000 MG CAPS Take by mouth daily.        . pantoprazole (PROTONIX) 40 MG tablet Take 40 mg by mouth daily.        Marland Kitchen QVAR 80 MCG/ACT inhaler Inhale 1 puff into the lungs 2 (two) times daily.       . mometasone (NASONEX) 50 MCG/ACT nasal spray Place 2 sprays into the nose daily.        . cetirizine (ZYRTEC) 5 MG tablet Take 5 mg by  mouth daily.        Marland Kitchen escitalopram (LEXAPRO) 20 MG tablet Take 10 mg by mouth daily.       . RABEprazole (ACIPHEX) 20 MG tablet Take 1 tablet (20 mg total) by mouth at bedtime.  14 tablet  0   No facility-administered medications prior to visit.      Review of Systems  Constitutional: Negative for fever and unexpected weight change.  HENT: Positive for congestion and sneezing. Negative for ear pain, nosebleeds, sore throat, rhinorrhea, trouble swallowing, dental problem, postnasal drip and sinus pressure.   Eyes: Negative for redness and itching.  Respiratory: Positive for shortness of breath. Negative for cough, chest tightness and wheezing.   Cardiovascular: Negative for palpitations and leg swelling.  Gastrointestinal: Negative for nausea and vomiting.  Genitourinary: Negative for dysuria.  Musculoskeletal: Negative for joint swelling.  Skin: Negative for rash.  Neurological: Negative for headaches.  Hematological: Does not bruise/bleed easily.  Psychiatric/Behavioral: Negative for dysphoric mood. The patient is not nervous/anxious.        Objective:   Physical Exam  Vitals reviewed. Constitutional: She is oriented to person, place, and time. She appears well-developed and well-nourished. No distress.  HENT:  Head: Normocephalic and atraumatic.  Right Ear: External ear normal.  Left Ear: External ear normal.  Mouth/Throat: Oropharynx is clear and moist. No oropharyngeal exudate.  Eyes: Conjunctivae and EOM are normal. Pupils are equal, round,  and reactive to light. Right eye exhibits no discharge. Left eye exhibits no discharge. No scleral icterus.  Neck: Normal range of motion. Neck supple. No JVD present. No tracheal deviation present. No thyromegaly present.  Cardiovascular: Normal rate, regular rhythm, normal heart sounds and intact distal pulses.  Exam reveals no gallop and no friction rub.   No murmur heard. Pulmonary/Chest: Effort normal and breath sounds normal. No respiratory distress. She has no wheezes. She has no rales. She exhibits no tenderness.  Abdominal: Soft. Bowel sounds are normal. She exhibits no distension and no mass. There is no tenderness. There is no rebound and no guarding.  Musculoskeletal: Normal range of motion. She exhibits no edema and no tenderness.  Lymphadenopathy:    She has no cervical adenopathy.  Neurological: She is alert and oriented to person, place, and time. She has normal reflexes. No cranial nerve deficit. She exhibits normal muscle tone. Coordination normal.  Skin: Skin is warm and dry. No rash noted. She is not diaphoretic. No erythema. No pallor.  Psychiatric: She has a normal mood and affect. Her behavior is normal. Judgment and thought content normal.          Assessment & Plan:

## 2013-06-07 NOTE — Patient Instructions (Addendum)
#  Asthma - is well controlled  - per Dr Lucie Leather  - flu shot 06/07/2013  #Lung nodules  - no further followup but will confirm this plan with dedicated chest radiologist and call you back - update: D/w Dr Fredirick Lathe: she recommends fu CT to be on safe side. She recommended 6 months and dR Rito Ehrlich recommended 12 months; will doone in 59 mnth  #FOlllwup - based on phone call; likely no further active followup

## 2013-06-12 ENCOUNTER — Telehealth: Payer: Self-pay | Admitting: Internal Medicine

## 2013-06-12 ENCOUNTER — Encounter: Payer: Self-pay | Admitting: Internal Medicine

## 2013-06-12 DIAGNOSIS — R918 Other nonspecific abnormal finding of lung field: Secondary | ICD-10-CM

## 2013-06-12 DIAGNOSIS — J45998 Other asthma: Secondary | ICD-10-CM | POA: Insufficient documentation

## 2013-06-12 DIAGNOSIS — Z23 Encounter for immunization: Secondary | ICD-10-CM | POA: Insufficient documentation

## 2013-06-12 NOTE — Telephone Encounter (Signed)
Let her know that I spoke tor chest radiologist via email. And we think the CT findings are all benign but to be on safe side repeat ct chst in 9 mpnths. IF she agrees, order CT Chest wo contrast an din comments write "High Resolution CT chest without contrast on ILD protocol. Only  Dr Leanna Battles or Dr. Trudie Reed to read"  Give her fu after ct   Dr. Kalman Shan, M.D., [facility].C.P Pulmonary and Critical Care Medicine Staff Physician [facility] Pager: [phone], If no answer or between  15:00h - 7:00h: call [phone]  06/12/2013 10:08 PM

## 2013-06-12 NOTE — Assessment & Plan Note (Signed)
#  Asthma - is well controlled with QVR, PPI and nasal steroid  - per Dr Lucie Leather  - flu shot 06/07/2013

## 2013-06-12 NOTE — Assessment & Plan Note (Signed)
severeal new lung nodules all < 4mm. Radioligist thinks RB-ILD pattern; I disagree esp because she is a near non-smoker. Wil lget fu  In 9 months

## 2013-06-16 NOTE — Telephone Encounter (Signed)
Pt is aware and order placed. Nothing further needed 

## 2013-06-16 NOTE — Telephone Encounter (Signed)
[email or web address].Denea Cheaney, CMA  

## 2013-07-08 ENCOUNTER — Ambulatory Visit (INDEPENDENT_AMBULATORY_CARE_PROVIDER_SITE_OTHER): Payer: BC Managed Care – PPO | Admitting: Gastroenterology

## 2013-07-08 ENCOUNTER — Encounter: Payer: Self-pay | Admitting: Gastroenterology

## 2013-07-08 VITALS — BP 133/82 | HR 70 | Temp 97.9°F | Ht 67.0 in | Wt 179.0 lb

## 2013-07-08 DIAGNOSIS — K589 Irritable bowel syndrome without diarrhea: Secondary | ICD-10-CM

## 2013-07-08 DIAGNOSIS — R141 Gas pain: Secondary | ICD-10-CM

## 2013-07-08 DIAGNOSIS — R142 Eructation: Secondary | ICD-10-CM

## 2013-07-08 DIAGNOSIS — R110 Nausea: Secondary | ICD-10-CM

## 2013-07-08 DIAGNOSIS — K219 Gastro-esophageal reflux disease without esophagitis: Secondary | ICD-10-CM

## 2013-07-08 DIAGNOSIS — R140 Abdominal distension (gaseous): Secondary | ICD-10-CM

## 2013-07-08 MED ORDER — RABEPRAZOLE SODIUM 20 MG PO TBEC: 20.0000 mg | DELAYED_RELEASE_TABLET | Freq: Every day | ORAL | Status: DC

## 2013-07-08 NOTE — Patient Instructions (Signed)
1. Stop pantoprazole. 2. Start generic Aciphex one before breakfast daily. Prescription sent to your mail order pharmacy. Looks like it should be covered by your plan. 3. Continue Probiotic. Align one daily for two weeks. Samples provided. After complete, you can continue [facility]. 4. Please let me know if you are able to determine any particular activities or foods that are associated with your nausea.  5. Please consume LOW FODMAP diet. See separate handout.

## 2013-07-08 NOTE — Progress Notes (Signed)
Primary Care Physician: Alinda Deem, MD  Primary Gastroenterologist:  Roetta Sessions, MD   Chief Complaint  Patient presents with  . Emesis  . Nausea    HPI: Caitlin Tanner is a 62 y.o. female here with complaints of nausea/vomiting. She was last seen in February 2013. She has a history of nausea, bloating, lower abdominal pain. History of chronic GERD and IBS.  Recently had several episodes of N/V over course of one day. Husband also sick. Was having bloating, nausea leading up to that. BM most days 2-3 times per day. No melena. No brbpr. Appetite good. Increased activity. However, chronically has nausea 1-2 times per 10-14 days for the past few months. . Last few hours at a time. Can occur when waking up. +PND/allergies. Feels like Pantoprazole not as good as Aciphex. Just doesn't feel like GERD well-controlled but only with occasional nightime HB. Occasionally doubles up on pantoprazole and takes night time dose. Eats TUMS when nauseated. Abdominal cramps better with BM.  Current Outpatient Prescriptions  Medication Sig Dispense Refill  . aspirin 81 MG tablet Take 81 mg by mouth daily.        . Azelastine-Fluticasone (DYMISTA) 137-50 MCG/ACT SUSP Place 1 spray into the nose daily.      . Calcium Carbonate-Vitamin D (CALTRATE 600+D) 600-400 MG-UNIT per tablet Take 1 tablet by mouth 2 (two) times daily.        . cetirizine (ZYRTEC) 5 MG tablet Take 5 mg by mouth daily.      Marland Kitchen escitalopram (LEXAPRO) 5 MG tablet Take 5 mg by mouth daily.      Marland Kitchen ezetimibe (ZETIA) 10 MG tablet Take 10 mg by mouth daily.        Marland Kitchen LORazepam (ATIVAN) 0.5 MG tablet Take 0.5 mg by mouth as needed.        Marland Kitchen losartan-hydrochlorothiazide (HYZAAR) 50-12.5 MG per tablet Take 1 tablet by mouth daily.        . metoprolol (TOPROL-XL) 50 MG 24 hr tablet Take 50 mg by mouth daily.        . multivitamin (THERAGRAN) per tablet Take 1 tablet by mouth daily.        . Omega-3 Fatty Acids (FISH OIL) 1000 MG CAPS Take by mouth  daily.        . pantoprazole (PROTONIX) 40 MG tablet Take 40 mg by mouth daily.        Marland Kitchen QVAR 80 MCG/ACT inhaler Inhale 1 puff into the lungs 2 (two) times daily.       Marland Kitchen loratadine (CLARITIN) 10 MG tablet Take 10 mg by mouth daily.       No current facility-administered medications for this visit.    Allergies as of 07/08/2013 - Review Complete 07/08/2013  Allergen Reaction Noted  . Conj estrog-medroxyprogest ace    . Guaifenesin    . Ketorolac tromethamine    . Tramadol hcl      ROS:  General: Negative for anorexia, weight loss, fever, chills, fatigue, weakness. ENT: Negative for hoarseness, difficulty swallowing , nasal congestion. CV: Negative for chest pain, angina, palpitations, dyspnea on exertion, peripheral edema.  Respiratory: Negative for dyspnea at rest, dyspnea on exertion, cough, sputum, wheezing.  GI: See history of present illness. GU:  Negative for dysuria, hematuria, urinary incontinence, urinary frequency, nocturnal urination.  Endo: Negative for unusual weight change.    Physical Examination:   BP 133/82  Pulse 70  Temp(Src) 97.9 F (36.6 C) (Oral)  Ht 5\' 7"  (1.702 m)  Wt 179 lb (81.194 kg)  BMI 28.03 kg/m2  General: Well-nourished, well-developed in no acute distress.  Eyes: No icterus. Mouth: Oropharyngeal mucosa moist and pink , no lesions erythema or exudate. Lungs: Clear to auscultation bilaterally.  Heart: Regular rate and rhythm, no murmurs rubs or gallops.  Abdomen: Bowel sounds are normal, nontender, nondistended, no hepatosplenomegaly or masses, no abdominal bruits or hernia , no rebound or guarding.   Extremities: No lower extremity edema. No clubbing or deformities. Neuro: Alert and oriented x 4   Skin: Warm and dry, no jaundice.   Psych: Alert and cooperative, normal mood and affect.

## 2013-07-08 NOTE — Assessment & Plan Note (Addendum)
Doing well right now with regards to bowel function. She does have some bloating and occasional cramps. Unclear if nausea related to refractory GERD, PND, IBS.   1. Change pantoprazole to Aciphex. 2. Align for two weeks and then [employer]. 3. Low FODMAP diet. 4. Track symptoms of nausea to try and determine if any particular types of food related or certain activites.  5. If symptoms become more frequently, consider gastroparesis.

## 2013-07-11 NOTE — Progress Notes (Signed)
cc'd to pcp 

## 2013-07-19 ENCOUNTER — Ambulatory Visit: Payer: BC Managed Care – PPO | Admitting: Gastroenterology

## 2013-07-30 HISTORY — PX: WRIST FRACTURE SURGERY: SHX121

## 2013-08-09 ENCOUNTER — Ambulatory Visit
Admission: RE | Admit: 2013-08-09 | Discharge: 2013-08-09 | Disposition: A | Discharge status: Home / Self Care | Payer: BC Managed Care – PPO | Source: Ambulatory Visit | Attending: Orthopedic Surgery | Admitting: Orthopedic Surgery

## 2013-08-09 ENCOUNTER — Other Ambulatory Visit: Payer: Self-pay | Admitting: Orthopedic Surgery

## 2013-08-09 DIAGNOSIS — S52501A Unspecified fracture of the lower end of right radius, initial encounter for closed fracture: Secondary | ICD-10-CM

## 2013-09-02 IMAGING — CT CT ABD-PELV W/ CM
2 of 4 series · 16 of 46 positions shown, 18 images · IV contrast (Omnipaque 300)
Comparison: None.

CLINICAL DATA: Left lower quadrant pain.  History of IBS.

CT ABDOMEN AND PELVIS WITH CONTRAST
TECHNIQUE: Multidetector CT imaging of the abdomen and pelvis was
performed following the standard protocol during bolus
administration of intravenous contrast.
Contrast: 100mL OMNIPAQUE IOHEXOL 300 MG/ML IJ SOLN

[Series 2: abd_pel_with 5.0 b40f · axial · 0.75mm/px · z∈[-376,+34]mm · 13 of 92 slices shown, 15 images]
[im 5/92  soft-tissue]
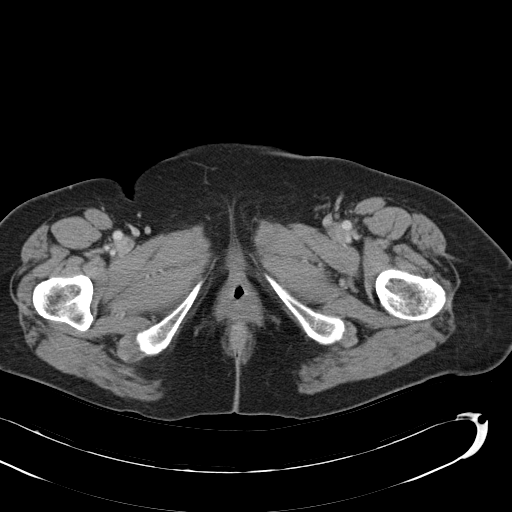
[im 5/92  bone]
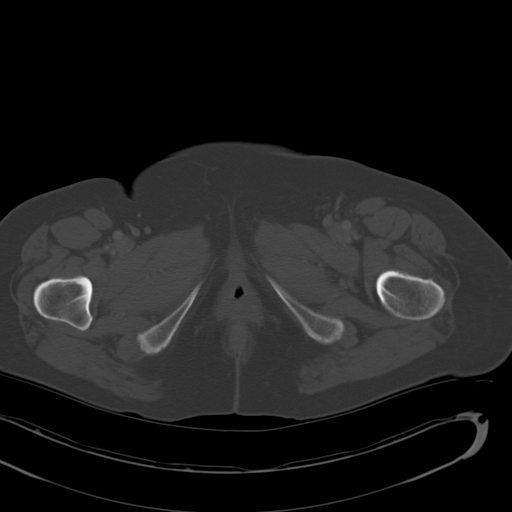
[im 14/92  soft-tissue]
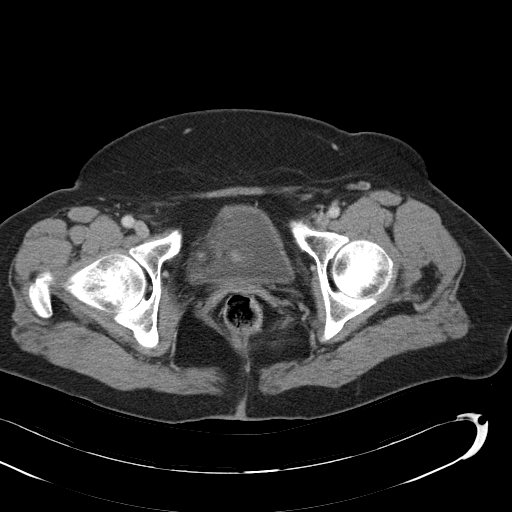
[im 19/92  soft-tissue]
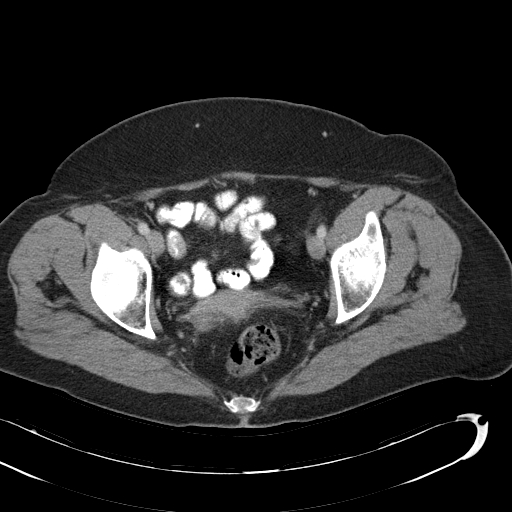
[im 28/92  soft-tissue]
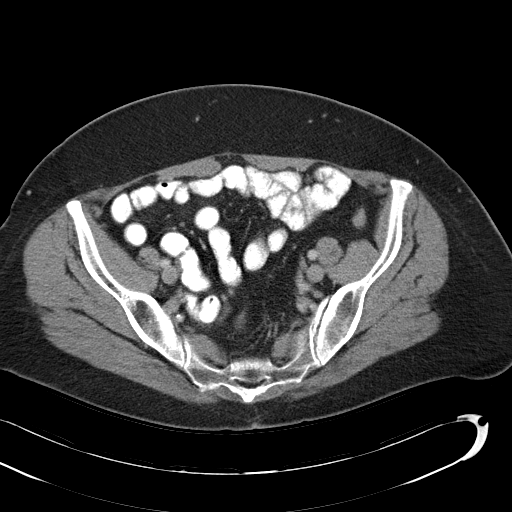
[im 32/92  soft-tissue]
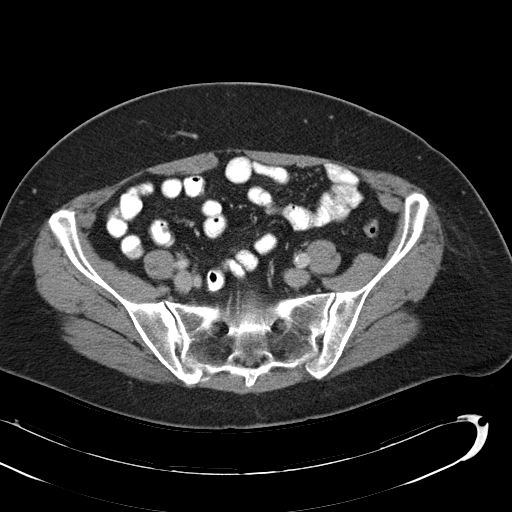
[im 41/92  soft-tissue]
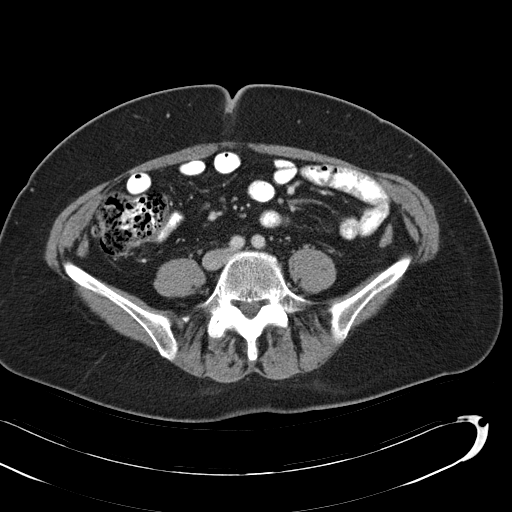
[im 46/92  soft-tissue]
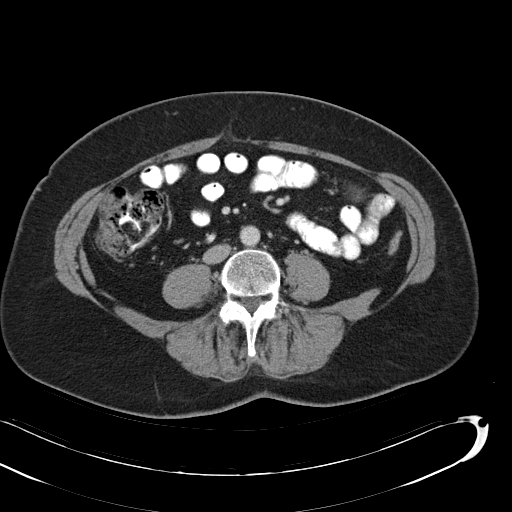
[im 51/92  soft-tissue]
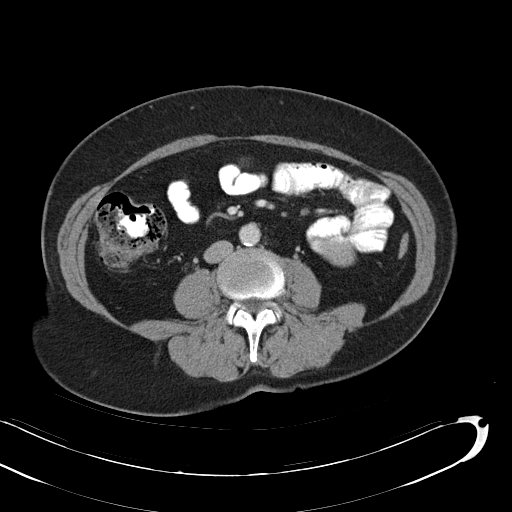
[im 60/92  soft-tissue]
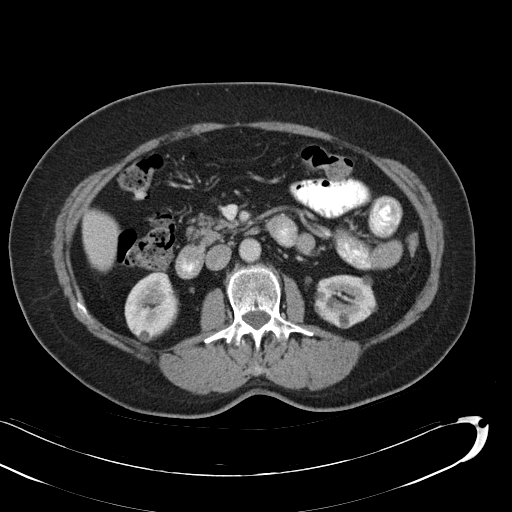
[im 60/92  bone]
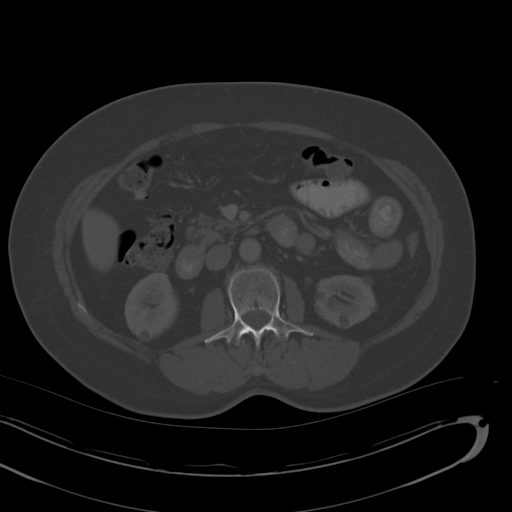
[im 64/92  soft-tissue]
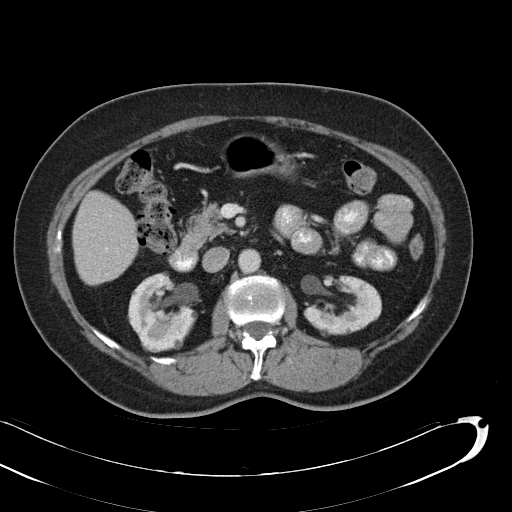
[im 73/92  soft-tissue]
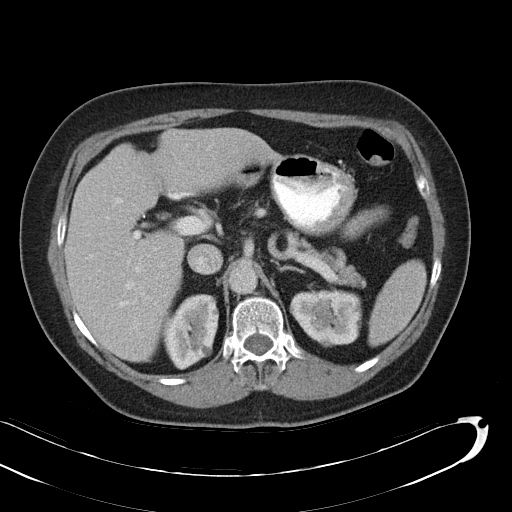
[im 78/92  soft-tissue]
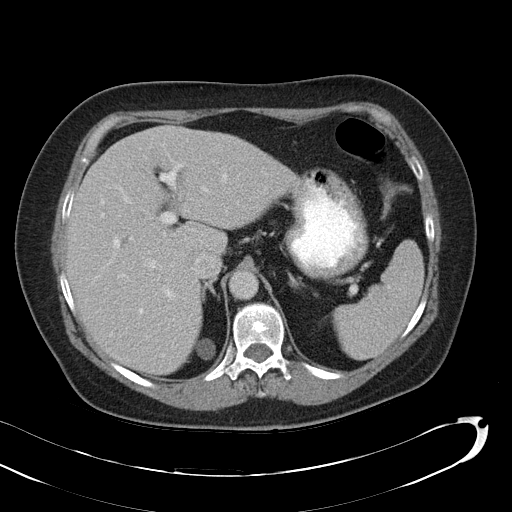
[im 87/92  soft-tissue]
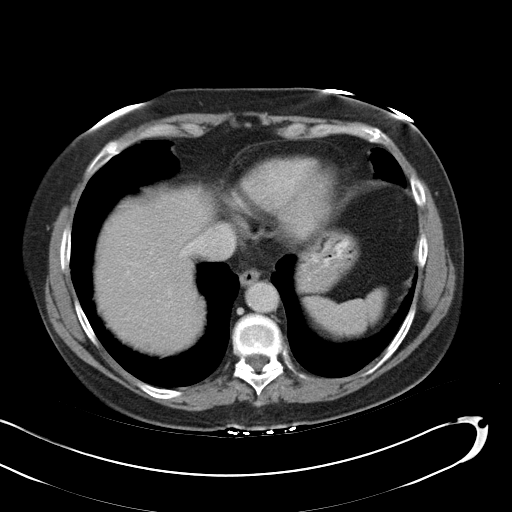

[Series 4: abd_pel_with 3.0 spo cor · coronal · 0.60mm/px · 3 of 81 slices shown]
[im 27/81  soft-tissue]
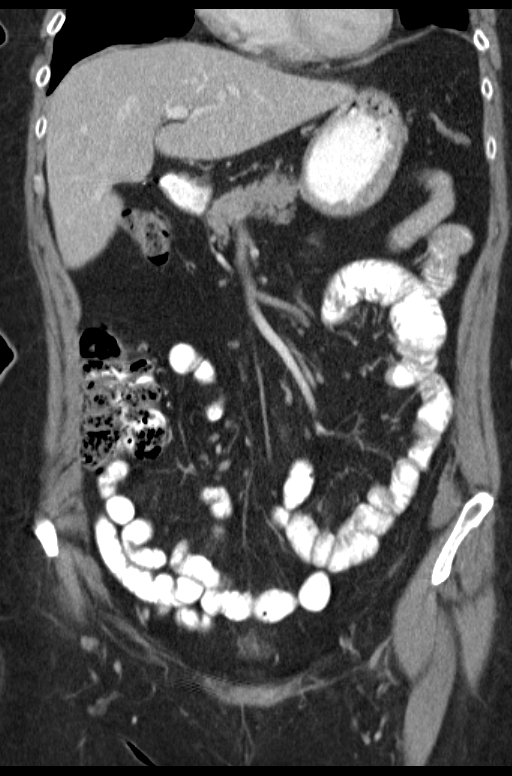
[im 36/81  soft-tissue]
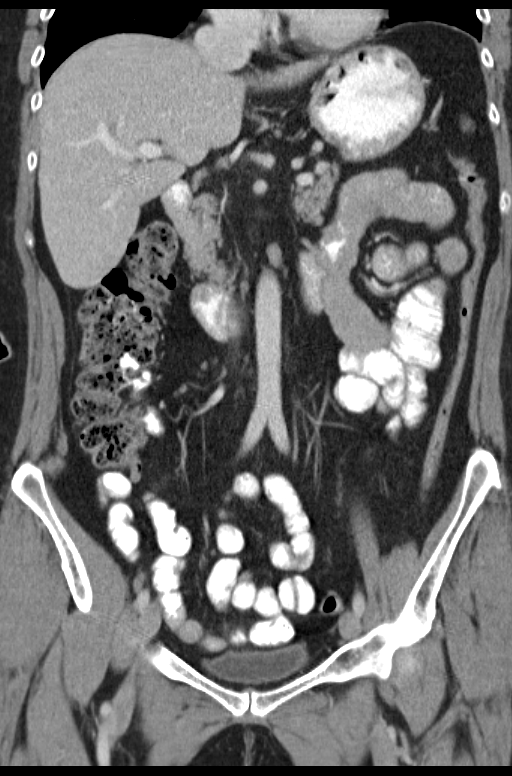
[im 45/81  soft-tissue]
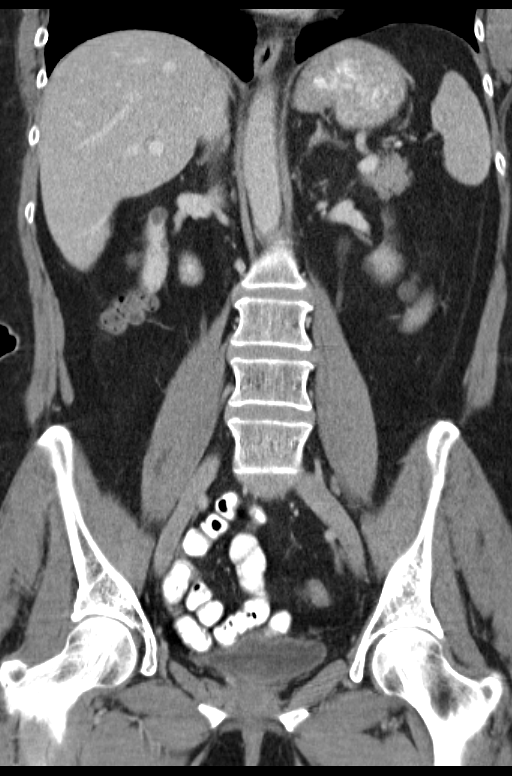

[16 of 46 positions shown; findings below may reference images not displayed]

FINDINGS: The lung bases appear clear.

No pericardial or pleural effusions.

No focal liver abnormalities.

The spleen is normal.

The adrenal glands are both normal.

The pancreas is within normal limits.

There are multiple bilateral renal cysts.  Many of these cysts
measure less than 1 cm and are too small to reliably characterize.
No upper abdominal adenopathy.

No pelvic or inguinal adenopathy identified.

The stomach appears normal.

The small bowel loops are unremarkable.

The appendix appears normal.  The colon is unremarkable.

Review of the visualized osseous structures is unremarkable.
IMPRESSION: 1.  There is no acute findings identified within the abdomen or
pelvis.
2.  Multiple bilateral renal cysts.

## 2013-11-08 ENCOUNTER — Telehealth: Payer: Self-pay | Admitting: *Deleted

## 2013-11-08 MED ORDER — ONDANSETRON HCL 4 MG PO TABS: 4.0000 mg | ORAL_TABLET | Freq: Four times a day (QID) | ORAL | Status: DC | PRN

## 2013-11-08 NOTE — Telephone Encounter (Signed)
Pt called saying early Friday morning she started throwing up and having diarrhea, pt states she had it all weekend, pt states she is still having nausea, pt thinks she may have got a bug, pt would like to see if she can get something for nausea. Please advise [phone]

## 2013-11-08 NOTE — Telephone Encounter (Signed)
RX for Zofran will have to be faxed as local pharmacy not listed.  Sip about 1 tbsp of liquid every 15 minutes to stay hydrated. Clear liquids only.

## 2013-11-08 NOTE — Telephone Encounter (Signed)
Pt was last seen 07/08/2013 by LSL for nausea and vomiting. Will route to her for recommendations.

## 2013-11-08 NOTE — Telephone Encounter (Signed)
Pt is aware. rx faxed to [facility] in [place].

## 2013-12-22 ENCOUNTER — Telehealth: Payer: Self-pay

## 2013-12-22 MED ORDER — HYOSCYAMINE SULFATE ER 0.375 MG PO TB12: 0.3750 mg | ORAL_TABLET | Freq: Two times a day (BID) | ORAL | Status: DC | PRN

## 2013-12-22 MED ORDER — PROMETHAZINE HCL 12.5 MG PO TABS: 12.5000 mg | ORAL_TABLET | Freq: Three times a day (TID) | ORAL | Status: DC | PRN

## 2013-12-22 NOTE — Telephone Encounter (Signed)
She has an appointment on April 23 to see LSL. She was unable to get the Zofran but has been taken Bonine 25 mg OCT but it makes her very sleepy. She is also asking about some Levbid for IBS. Please advise

## 2013-12-22 NOTE — Telephone Encounter (Signed)
Agree with OV. Try low dose phenergan 12.5mg  tid prn. Do not take with bonine.  Can try levbid if having cramps or loose stool. Hold for constipation.  RXs WILL NEED TO BE CALLED IN AS I DIDN'T KNOW WHICH PHARMACY TO SEND TO LOCAL OR MAIL ORDER

## 2013-12-26 MED ORDER — PROMETHAZINE HCL 12.5 MG PO TABS: 12.5000 mg | ORAL_TABLET | Freq: Three times a day (TID) | ORAL | Status: DC | PRN

## 2013-12-26 MED ORDER — HYOSCYAMINE SULFATE ER 0.375 MG PO TB12: 0.3750 mg | ORAL_TABLET | Freq: Two times a day (BID) | ORAL | Status: DC | PRN

## 2013-12-26 NOTE — Telephone Encounter (Signed)
Pt is aware. Resent rx's to [facility] on [address].

## 2013-12-26 NOTE — Telephone Encounter (Signed)
rx's printed. Faxed to [place].

## 2014-01-19 ENCOUNTER — Ambulatory Visit (INDEPENDENT_AMBULATORY_CARE_PROVIDER_SITE_OTHER): Payer: Medicare Other | Admitting: Gastroenterology

## 2014-01-19 ENCOUNTER — Encounter: Payer: Self-pay | Admitting: Gastroenterology

## 2014-01-19 VITALS — BP 111/67 | HR 62 | Temp 97.9°F | Ht 67.0 in | Wt 173.2 lb

## 2014-01-19 DIAGNOSIS — K589 Irritable bowel syndrome without diarrhea: Secondary | ICD-10-CM

## 2014-01-19 DIAGNOSIS — R110 Nausea: Secondary | ICD-10-CM

## 2014-01-19 DIAGNOSIS — K219 Gastro-esophageal reflux disease without esophagitis: Secondary | ICD-10-CM

## 2014-01-19 MED ORDER — RABEPRAZOLE SODIUM 20 MG PO TBEC: DELAYED_RELEASE_TABLET | ORAL | Status: DC

## 2014-01-19 NOTE — Assessment & Plan Note (Signed)
Doing well. She has Levbid on hand for IBS-diarrhea. Rarely uses. [facility] helps

## 2014-01-19 NOTE — Assessment & Plan Note (Signed)
Doing well. Occasional twice a day AcipHex required for indigestion. Continue current regimen. Discussed antireflux measures. Office visit in one year.

## 2014-01-19 NOTE — Patient Instructions (Signed)
1. Continue Aciphex one to two pills daily. New prescription sent to your mail-order pharmacy. 2. Call if you nausea is occuring 3-4 times per week. We can pursue gastric emptying study at that time. 3. Office visit in one year.

## 2014-01-19 NOTE — Assessment & Plan Note (Signed)
Possibly related to GERD or mild gastroparesis. If symptoms are progressive, would offer gastric emptying study. We discussed gastroparesis diet and lifestyle changes. She would like to try some of these things to see if it improves her nausea.

## 2014-01-19 NOTE — Progress Notes (Signed)
Primary Care Physician: Earney Mallet, MD  Primary Gastroenterologist:  Garfield Cornea, MD   Chief Complaint  Patient presents with  . Follow-up    HPI: Caitlin Tanner is a 63 y.o. female here for followup. She called [place] back in February with acute onset nausea, vomiting, diarrhea. This lasted for several days. She is slowly feeling back to normal. She has a history of intermittent nausea which is been quite sporadic. She believes it may be related to postnasal drip. Has not been able to determine any aggravating factors. At time of her EGD she had a small amount of retained food so we have question possibility of gastroparesis. But up to this time she has not been ready to pursue testing.  Denies typical heartburn. Does have some indigestion at times. Generally takes her AcipHex once daily but occasionally twice daily. Denies any dysphagia. No abdominal pain. [facility] helps regulate bowel function. No melena, brbpr.   Current Outpatient Prescriptions  Medication Sig Dispense Refill  . aspirin 81 MG tablet Take 81 mg by mouth daily.        . Azelastine-Fluticasone (DYMISTA) 137-50 MCG/ACT SUSP Place 1 spray into the nose daily.      . Calcium Carbonate-Vitamin D (CALTRATE 600+D) 600-400 MG-UNIT per tablet Take 1 tablet by mouth 2 (two) times daily.        Marland Kitchen escitalopram (LEXAPRO) 5 MG tablet Take 5 mg by mouth daily.      Marland Kitchen ezetimibe (ZETIA) 10 MG tablet Take 10 mg by mouth daily.        Marland Kitchen loratadine (CLARITIN) 10 MG tablet Take 10 mg by mouth daily.      Marland Kitchen LORazepam (ATIVAN) 0.5 MG tablet Take 0.5 mg by mouth as needed.        Marland Kitchen losartan-hydrochlorothiazide (HYZAAR) 50-12.5 MG per tablet Take 1 tablet by mouth daily.        . metoprolol (TOPROL-XL) 50 MG 24 hr tablet Take 50 mg by mouth daily.        . multivitamin (THERAGRAN) per tablet Take 1 tablet by mouth daily.        . Omega-3 Fatty Acids (FISH OIL) 1000 MG CAPS Take by mouth daily.        Marland Kitchen QVAR 80 MCG/ACT  inhaler Inhale 1 puff into the lungs 2 (two) times daily.       . RABEprazole (ACIPHEX) 20 MG tablet Take 1 tablet (20 mg total) by mouth daily.  90 tablet  3   No current facility-administered medications for this visit.    Allergies as of 01/19/2014 - Review Complete 01/19/2014  Allergen Reaction Noted  . Conj estrog-medroxyprogest ace    . Guaifenesin    . Ketorolac tromethamine    . Tramadol hcl      ROS:  General: Negative for anorexia, weight loss, fever, chills, fatigue, weakness. ENT: Negative for hoarseness, difficulty swallowing , nasal congestion. CV: Negative for chest pain, angina, palpitations, dyspnea on exertion, peripheral edema.  Respiratory: Negative for dyspnea at rest, dyspnea on exertion, cough, sputum, wheezing.  GI: See history of present illness. GU:  Negative for dysuria, hematuria, urinary incontinence, urinary frequency, nocturnal urination.  Endo: Negative for unusual weight change.    Physical Examination:   BP 111/67  Pulse 62  Temp(Src) 97.9 F (36.6 C) (Oral)  Ht 5\' 7"  (1.702 m)  Wt 173 lb 3.2 oz (78.563 kg)  BMI 27.12 kg/m2  General: Well-nourished, well-developed in no acute distress.  Eyes: No icterus. Mouth: Oropharyngeal mucosa moist and pink , no lesions erythema or exudate. Lungs: Clear to auscultation bilaterally.  Heart: Regular rate and rhythm, no murmurs rubs or gallops.  Abdomen: Bowel sounds are normal, nontender, nondistended, no hepatosplenomegaly or masses, no abdominal bruits or hernia , no rebound or guarding.   Extremities: No lower extremity edema. No clubbing or deformities. Neuro: Alert and oriented x 4   Skin: Warm and dry, no jaundice.   Psych: Alert and cooperative, normal mood and affect.

## 2014-01-23 NOTE — Progress Notes (Signed)
cc'd to pcp 

## 2014-02-21 ENCOUNTER — Telehealth: Payer: Self-pay | Admitting: Internal Medicine

## 2014-02-21 ENCOUNTER — Other Ambulatory Visit: Payer: Self-pay

## 2014-02-21 MED ORDER — RABEPRAZOLE SODIUM 20 MG PO TBEC: DELAYED_RELEASE_TABLET | ORAL | Status: DC

## 2014-02-21 NOTE — Telephone Encounter (Signed)
Pt called today about her pharmacy ([facility]) to her that we needed to call [phone] because they needed more info from [place] on her prescription of generic Aciphex.

## 2014-03-22 ENCOUNTER — Ambulatory Visit (HOSPITAL_COMMUNITY)
Admission: RE | Admit: 2014-03-22 | Discharge: 2014-03-22 | Disposition: A | Discharge status: Home / Self Care | Payer: Medicare Other | Source: Ambulatory Visit | Attending: Internal Medicine | Admitting: Internal Medicine

## 2014-03-22 DIAGNOSIS — J438 Other emphysema: Secondary | ICD-10-CM | POA: Insufficient documentation

## 2014-03-22 DIAGNOSIS — I700 Atherosclerosis of aorta: Secondary | ICD-10-CM | POA: Insufficient documentation

## 2014-03-22 DIAGNOSIS — R918 Other nonspecific abnormal finding of lung field: Secondary | ICD-10-CM | POA: Diagnosis present

## 2014-03-22 DIAGNOSIS — N289 Disorder of kidney and ureter, unspecified: Secondary | ICD-10-CM | POA: Insufficient documentation

## 2014-04-05 ENCOUNTER — Telehealth: Payer: Self-pay | Admitting: Internal Medicine

## 2014-04-05 NOTE — Progress Notes (Signed)
Quick Note:  lmtcb ______ 

## 2014-04-06 NOTE — Telephone Encounter (Signed)
LMTCB X2    Notes Recorded by Maurice March, RN on 04/05/2014 at 11:32 AM lmtcb ------  Notes Recorded by Brand Males, MD on 04/04/2014 at 10:25 PM Ct chest 9 month followup no change in tiny nodules. Give her fu wwith NP or me first available

## 2014-04-06 NOTE — Progress Notes (Signed)
Quick Note:  lmtcb x 2  Phone note from 04/06/2014 regarding results ______

## 2014-04-07 NOTE — Telephone Encounter (Signed)
Pt returned call, please call back at [phone]

## 2014-04-07 NOTE — Telephone Encounter (Signed)
Pt aware. appt scheduled for 05/12/14

## 2014-04-07 NOTE — Telephone Encounter (Signed)
Called and spoke with pts husband and he stated that the pt was at work and gave me her work number, but was not sure if this was the correct number.  i tried the number that he gave and this was not the correct number.  Will try the pt back later.

## 2014-05-22 ENCOUNTER — Encounter: Payer: Self-pay | Admitting: Internal Medicine

## 2014-05-22 ENCOUNTER — Ambulatory Visit (INDEPENDENT_AMBULATORY_CARE_PROVIDER_SITE_OTHER): Payer: Medicare Other | Admitting: Internal Medicine

## 2014-05-22 VITALS — BP 138/76 | HR 73 | Ht 67.0 in | Wt 181.0 lb

## 2014-05-22 DIAGNOSIS — J45998 Other asthma: Secondary | ICD-10-CM

## 2014-05-22 DIAGNOSIS — J45909 Unspecified asthma, uncomplicated: Secondary | ICD-10-CM

## 2014-05-22 DIAGNOSIS — R918 Other nonspecific abnormal finding of lung field: Secondary | ICD-10-CM

## 2014-05-22 NOTE — Patient Instructions (Addendum)
#  Asthma  - per Dr Neldon Mc  -ensure  flu shot in Fall 2015  #Lung nodules  -stable between July 2014 -> June 2015 - repeat CT chest wo contrast sept 2016  #FOlllwup - sept 2016 after CT chest

## 2014-05-22 NOTE — Progress Notes (Signed)
Subjective:    Patient ID: Caitlin Tanner, female    DOB: 1951/08/20, 63 y.o.   MRN: [id number]  HPI  PCP Earney Mallet, MD Allergist : DR Allena Katz   HPI IOV 06/07/2013  89 old almost nonsmoking female referred by Dr. Allena Katz for question of pulmonary nodules multiple or less than 4 mm.   She is known to have asthma and is followed by Dr. Neldon Mc for the same. Chart review shows that in August 2013 do to respiratory infection flare and also chest x-ray findings suggesting interstitial lung disease changes in the right lung base a CT scan of the chest was done. This was read by Dr Lorin Picket who interpreted the scan as no evidence of interstitial lung disease but some patchy amorphous infiltrates likely postinfectious in the right upper lobe and some scattered pulmonary nodules less than 4 mm and recommended a scan in one year. She then had a followup scan the year later 04/26/2013 and this was interpreted by Dr. Maryland Pink and detailed below and there is some concern for new pulmonary nodules. However all these nodules are reported to be less than or equal to 4 mm in size. I have personally review the film and these nodules are so small on the monitor in the clinic but I cannot appreciate these.  According to her history asthma is very well controlled. Chart review shows that in November 2013 FEV1 was 1.4 L/54% predicted. Her last albuterol use was over a month ago. She's alpha-1 antitrypsin and MM type and normal levels. She is mildly elevated IgE; last at 206.   Of note, she does not have travel history to any states that involves soil fungi   History  Smoking status  . Former Smoker -- 0.10 packs/day for 3 years  . Types: Cigarettes  . Quit date: 09/30/1987  Smokeless tobacco  . 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  Comparison: CT chest dated 05/27/2012  Findings: Numerous small pulmonary nodules bilaterally, including:  --4 mm ground-glass nodule in the left upper lobe (series 3/image  13), new  --3 mm nodule in the anterior right upper lobe (series 3/image 18),  unchanged  --3 mm nodule in the anterior left upper lobe (series 3/image 19),  unchanged  --3 mm nodule in the central right lower lobe (series 3/image 32),  possibly new  --3 mm ground-glass nodule in the left lower lobe (series 3/image  34), possibly new  These findings may be related to reported history of RBILD on the  prior study. Alternatively, they could be  postinfectious/inflammatory possibly related to atypical  mycobacterial infection given mild scarring / nodularity in the  right middle lobe (series 3/image 38).  No pleural effusion or pneumothorax.  The visualized thyroid is unremarkable.  The heart is normal in size. No pericardial effusion.   12 mm precarinal node with preservation of the normal fatty hilum  (series 2/image 24), unchanged. No suspicious axillary  lymphadenopathy.  The visualized upper abdomen is notable for tiny bilateral renal  cysts, some of which are hemorrhagic.  Mild degenerative changes of the visualized thoracolumbar spine.   IMPRESSION:  Numerous small pulmonary nodules bilaterally measuring up to  4 mm,  as described above, some of which are new.  The overall appearance favors RBILD or possibly a  postinfectious/inflammatory etiology such as atypical mycobacterial  infection.  Given mild interval change, an additional follow-up CT chest is  suggested in 12 months.  Original Report Authenticated By: Julian Hy, M.D.    #Asthma - is well controlled  - per Dr Neldon Mc  - flu shot 06/07/2013  #Lung nodules  - no further followup but will confirm  this plan with dedicated chest radiologist and call you back - update: D/w Dr Rosario Jacks: she recommends fu CT to be on safe side. She recommended 6 months and dR Maryland Pink recommended 12 months; will doone in 9 mnth  #FOlllwup - based on phone call; likely no further active followup   OV 05/22/2014  Chief Complaint  Patient presents with  . Follow-up    Pt last seen in 05/2013. Pt had CT chest in 02/2014. Pt denies SOB and CP/tightness. Pt states she is having PND and cough in morning d/t seasonal allergies.     Followup multiple pulmonary micronodules. There is a 1 year followup. CT scan of the chest June 2015 reported below show stability and pulmonary nodules. She has no interim complaints except that a month ago she did have some bronchitis episode and was given 5 days of prednisone by her allergist Dr. Neldon Mc, she is compliant with her Qvar and other medications but she still continues to have postnasal drip and some chronic cough. These are being addressed by her allergist. The only reason for followup here is pulmonary nodules.   CT chest 03/22/14 IMPRESSION:  1. Similar appearance of tiny upper lobe predominant pulmonary  nodules, likely post infectious or inflammatory. A new area of  clustered nodularity in the posterior left upper lobe is favored to  represent an area of bronchiolitis. Mild infection, including  atypical etiologies favored.  2. Mild centrilobular emphysema.  Electronically Signed  By: Abigail Miyamoto M.D.  On: 03/22/2014 14:37      Review of Systems  Constitutional: Negative for fever and unexpected weight change.  HENT: Positive for postnasal drip. Negative for congestion, dental problem, ear pain, nosebleeds, rhinorrhea, sinus pressure, sneezing, sore throat and trouble swallowing.   Eyes: Negative for redness and itching.  Respiratory: Positive for cough. Negative for chest tightness, shortness of breath and wheezing.   Cardiovascular: Negative for palpitations  and leg swelling.  Gastrointestinal: Negative for nausea and vomiting.  Genitourinary: Negative for dysuria.  Musculoskeletal: Negative for joint swelling.  Skin: Negative for rash.  Neurological: Negative for headaches.  Hematological: Does not bruise/bleed easily.  Psychiatric/Behavioral: Negative for dysphoric mood. The patient is not nervous/anxious.    Current outpatient prescriptions:aspirin 81 MG tablet, Take 81 mg by mouth daily.  , Disp: , Rfl: ;  Azelastine-Fluticasone (DYMISTA) 137-50 MCG/ACT SUSP, Place 1 spray into the nose daily., Disp: , Rfl: ;  Calcium Carbonate-Vitamin D (CALTRATE 600+D) 600-400 MG-UNIT per tablet, Take 1 tablet by mouth 2 (two) times daily.  , Disp: , Rfl: ;  escitalopram (LEXAPRO) 5 MG tablet, Take 5 mg by mouth daily., Disp: , Rfl:  ezetimibe (ZETIA) 10 MG tablet, Take 10 mg by mouth daily.  , Disp: , Rfl: ;  loratadine (CLARITIN) 10 MG tablet, Take 10 mg by mouth daily., Disp: , Rfl: ;  LORazepam (ATIVAN) 0.5 MG tablet, Take 0.5 mg by mouth as needed.  , Disp: , Rfl: ;  losartan-hydrochlorothiazide (HYZAAR) 50-12.5 MG per tablet, Take 1 tablet  by mouth daily.  , Disp: , Rfl: ;  metoprolol (TOPROL-XL) 50 MG 24 hr tablet, Take 50 mg by mouth daily.  , Disp: , Rfl:  multivitamin (THERAGRAN) per tablet, Take 1 tablet by mouth daily.  , Disp: , Rfl: ;  Omega-3 Fatty Acids (FISH OIL) 1000 MG CAPS, Take by mouth daily.  , Disp: , Rfl: ;  QVAR 80 MCG/ACT inhaler, Inhale 1 puff into the lungs 2 (two) times daily. , Disp: , Rfl: ;  RABEprazole (ACIPHEX) 20 MG tablet, Take one tablet before breakfast daily. May take additional dose before evening meal if needed., Disp: 60 tablet, Rfl: 5      Objective:   Physical Exam  Vitals reviewed. Constitutional: She is oriented to person, place, and time. She appears well-developed and well-nourished. No distress.  HENT:  Head: Normocephalic and atraumatic.  Right Ear: External ear normal.  Left Ear: External ear normal.    Mouth/Throat: Oropharynx is clear and moist. No oropharyngeal exudate.  Eyes: Conjunctivae and EOM are normal. Pupils are equal, round, and reactive to light. Right eye exhibits no discharge. Left eye exhibits no discharge. No scleral icterus.  Neck: Normal range of motion. Neck supple. No JVD present. No tracheal deviation present. No thyromegaly present.  Cardiovascular: Normal rate, regular rhythm, normal heart sounds and intact distal pulses.  Exam reveals no gallop and no friction rub.   No murmur heard. Pulmonary/Chest: Effort normal and breath sounds normal. No respiratory distress. She has no wheezes. She has no rales. She exhibits no tenderness.  Abdominal: Soft. Bowel sounds are normal. She exhibits no distension and no mass. There is no tenderness. There is no rebound and no guarding.  Musculoskeletal: Normal range of motion. She exhibits no edema and no tenderness.  Lymphadenopathy:    She has no cervical adenopathy.  Neurological: She is alert and oriented to person, place, and time. She has normal reflexes. No cranial nerve deficit. She exhibits normal muscle tone. Coordination normal.  Skin: Skin is warm and dry. No rash noted. She is not diaphoretic. No erythema. No pallor.  Psychiatric: She has a normal mood and affect. Her behavior is normal. Judgment and thought content normal.   Filed Vitals:   05/22/14 1119  BP: 138/76  Pulse: 73  Height: 5\' 7"  (1.702 m)  Weight: 181 lb (82.101 kg)  SpO2: 96%          Assessment & Plan:  #Asthma  - per Dr Neldon Mc  -ensure  flu shot in Fall 2015  #Lung nodules  -stable between July 2014 -> June 2015 - repeat CT chest wo contrast sept 2016  #FOlllwup - sept 2016 after CT chest

## 2014-05-29 NOTE — Assessment & Plan Note (Signed)
#  Lung nodules  -stable between July 2014 -> June 2015 - repeat CT chest wo contrast sept 2016  #FOlllwup - sept 2016 after CT chest

## 2014-12-21 ENCOUNTER — Encounter: Payer: Self-pay | Admitting: Internal Medicine

## 2015-03-06 ENCOUNTER — Ambulatory Visit (INDEPENDENT_AMBULATORY_CARE_PROVIDER_SITE_OTHER): Payer: Medicare Other | Admitting: Gastroenterology

## 2015-03-06 ENCOUNTER — Encounter: Payer: Self-pay | Admitting: Gastroenterology

## 2015-03-06 ENCOUNTER — Encounter (INDEPENDENT_AMBULATORY_CARE_PROVIDER_SITE_OTHER): Payer: Self-pay

## 2015-03-06 VITALS — BP 131/78 | HR 76 | Temp 97.2°F | Ht 66.0 in | Wt 184.8 lb

## 2015-03-06 DIAGNOSIS — K589 Irritable bowel syndrome without diarrhea: Secondary | ICD-10-CM

## 2015-03-06 DIAGNOSIS — K219 Gastro-esophageal reflux disease without esophagitis: Secondary | ICD-10-CM

## 2015-03-06 MED ORDER — RABEPRAZOLE SODIUM 20 MG PO TBEC: DELAYED_RELEASE_TABLET | ORAL | Status: DC

## 2015-03-06 MED ORDER — HYOSCYAMINE SULFATE 0.125 MG SL SUBL: 0.1250 mg | SUBLINGUAL_TABLET | Freq: Four times a day (QID) | SUBLINGUAL | Status: DC | PRN

## 2015-03-06 NOTE — Progress Notes (Signed)
Primary Care Physician: Earney Mallet, MD  Primary Gastroenterologist:  Garfield Cornea, MD   Chief Complaint  Patient presents with  . Follow-up    HPI: Caitlin Tanner is a 64 y.o. female here for follow-up. She has a history of GERD, IBS. Last seen in April 2015.  Overall is doing very well up until recently. Some intermittent nausea. No vomiting. Usually wakes up with it. Improved throughout the day. Typical heartburn well controlled. Generally takes AcipHex just before breakfast, rarely using evening dose. Denies dysphagia. Denies any new medications. She continues to have significant postnasal drip related to allergies. No associated abdominal pain. Symptoms started just a couple of weeks ago. Nothing seems to make better or worse.  Continues to have alternating constipation and diarrhea. Sometimes normal bowel movements. Has a bowel movement almost every day. No blood in the stool or melena. Typically 2-3 BMs daily. Feels "unsettled" but no pain. Previously did well on Levsin. No weight loss.    Current Outpatient Prescriptions  Medication Sig Dispense Refill  . aspirin 81 MG tablet Take 81 mg by mouth daily.      . Azelastine-Fluticasone (DYMISTA) 137-50 MCG/ACT SUSP Place 1 spray into the nose daily.    . Calcium Carbonate-Vitamin D (CALTRATE 600+D) 600-400 MG-UNIT per tablet Take 1 tablet by mouth 2 (two) times daily.      Marland Kitchen escitalopram (LEXAPRO) 5 MG tablet Take 5 mg by mouth daily.    Marland Kitchen ezetimibe (ZETIA) 10 MG tablet Take 10 mg by mouth daily.      . fluticasone-salmeterol (ADVAIR HFA) 230-21 MCG/ACT inhaler Inhale 2 puffs into the lungs 2 (two) times daily.    Marland Kitchen loratadine (CLARITIN) 10 MG tablet Take 10 mg by mouth daily.    Marland Kitchen LORazepam (ATIVAN) 0.5 MG tablet Take 0.5 mg by mouth as needed.      Marland Kitchen losartan-hydrochlorothiazide (HYZAAR) 50-12.5 MG per tablet Take 1 tablet by mouth daily.      . metoprolol (TOPROL-XL) 50 MG 24 hr tablet Take 50 mg by mouth daily.        . multivitamin (THERAGRAN) per tablet Take 1 tablet by mouth daily.      . Omega-3 Fatty Acids (FISH OIL) 1000 MG CAPS Take by mouth daily.      . RABEprazole (ACIPHEX) 20 MG tablet Take one tablet before breakfast daily. May take additional dose before evening meal if needed. 60 tablet 5   No current facility-administered medications for this visit.    Allergies as of 03/06/2015 - Review Complete 03/06/2015  Allergen Reaction Noted  . Conj estrog-medroxyprogest ace    . Guaifenesin    . Ketorolac tromethamine    . Tramadol hcl     Past Surgical History  Procedure Laterality Date  . Tonsillectomy    . Left great toe surgery    . Cold knife conization    . Cholecystectomy    . Esophagogastroduodenoscopy  06/08/2006    single distal erosion otherwise normal  . Colonoscopy  06/08/2006    internal hemorrhoids otherwise noraml colon and rectum  . Egd/tcs  11/2010    see PMH  . Wrist fracture surgery  07/2013    right  . Colonoscopy  11/2010    Dr. Gala Romney: Anal canal hemorrhoids, diverticulosis, random colon biopsies unremarkable.  . Esophagogastroduodenoscopy  11/2010    Dr. Gala Romney: Longitudinal for reading of the tubular esophagus, no evidence of eosinophilic esophagitis on biopsy, small hiatal hernia, minimal amount of retained food,  bile-stained mucosa.    ROS:  General: Negative for anorexia, weight loss, fever, chills, fatigue, weakness. ENT: Negative for hoarseness, difficulty swallowing , nasal congestion. CV: Negative for chest pain, angina, palpitations, dyspnea on exertion, peripheral edema.  Respiratory: Negative for dyspnea at rest, dyspnea on exertion, cough, sputum, wheezing.  GI: See history of present illness. GU:  Negative for dysuria, hematuria, urinary incontinence, urinary frequency, nocturnal urination.  Endo: Negative for unusual weight change.    Physical Examination:   BP 131/78 mmHg  Pulse 76  Temp(Src) 97.2 F (36.2 C)  Ht 5\' 6"  (1.676 m)  Wt 184 lb  12.8 oz (83.825 kg)  BMI 29.84 kg/m2  General: Well-nourished, well-developed in no acute distress.  Eyes: No icterus. Mouth: Oropharyngeal mucosa moist and pink , no lesions erythema or exudate. Lungs: Clear to auscultation bilaterally.  Heart: Regular rate and rhythm, no murmurs rubs or gallops.  Abdomen: Bowel sounds are normal, nontender, nondistended, no hepatosplenomegaly or masses, no abdominal bruits or hernia , no rebound or guarding.   Extremities: No lower extremity edema. No clubbing or deformities. Neuro: Alert and oriented x 4   Skin: Warm and dry, no jaundice.   Psych: Alert and cooperative, normal mood and affect.   Imaging Studies: No results found.

## 2015-03-06 NOTE — Assessment & Plan Note (Signed)
Typical heartburn symptoms well controlled. Complains of early morning nausea possibly related to reflux and/or postnasal drip. Increase AcipHex to twice a day for 30 days. If her symptoms do not settle down she'll let us know. If she is doing better at that time then would recommend going back to once daily AcipHex and using evening dose when necessary only. Return to the office in one year or sooner if needed.

## 2015-03-06 NOTE — Assessment & Plan Note (Signed)
Overall doing recently well. Stomach feels unsettled daily. Previously well managed on Levsin when necessary. Prescription provided. Warned of potential side effects. Return to the office in one year or sooner if needed.

## 2015-03-06 NOTE — Patient Instructions (Signed)
1. For nausea, increase AcipHex to twice daily before breakfast and your evening meal for the next 4 weeks. Your nausea may be related to reflux and/or postnasal drip. Please call in 4 weeks if your symptoms have not subsided. If you're feeling better at 4 weeks, try going back to AcipHex once daily with only additional dose in the evenings as needed (just like before). 2. Trial of Levsin, dissolve 1 under the tongue before meals and at bedtime as needed for abdominal cramping and loose stools. Hold for constipation. Please let me know if this is not covered by her insurance or is too expensive and we can make changes 3. Office visit in one year.

## 2015-03-06 NOTE — Progress Notes (Signed)
CC'ED TO PCP 

## 2015-03-07 NOTE — Progress Notes (Signed)
REVIEWED-NO ADDITIONAL RECOMMENDATIONS. 

## 2015-06-12 ENCOUNTER — Ambulatory Visit (INDEPENDENT_AMBULATORY_CARE_PROVIDER_SITE_OTHER)
Admission: RE | Admit: 2015-06-12 | Discharge: 2015-06-12 | Disposition: A | Discharge status: Home / Self Care | Payer: Medicare Other | Source: Ambulatory Visit | Attending: Internal Medicine | Admitting: Internal Medicine

## 2015-06-12 DIAGNOSIS — R918 Other nonspecific abnormal finding of lung field: Secondary | ICD-10-CM | POA: Diagnosis not present

## 2015-06-18 ENCOUNTER — Telehealth: Payer: Self-pay | Admitting: Internal Medicine

## 2015-06-18 NOTE — Telephone Encounter (Signed)
Ct chest sept 2016 shows stable lung nodules compared to 1 year ago. Benign nodule. Give first avaail fu  Dr. Brand Males, M.D., [facility].C.P Pulmonary and Critical Care Medicine Staff Physician [facility] Pager: [phone], If no answer or between  15:00h - 7:00h: call [phone]  06/18/2015 6:44 AM

## 2015-06-19 NOTE — Telephone Encounter (Signed)
lmtcb for pt.  

## 2015-06-20 NOTE — Telephone Encounter (Signed)
Pt informed of chest ct results and given f/u appt with Dr Chase Caller.

## 2015-06-20 NOTE — Telephone Encounter (Signed)
[phone] returning call

## 2015-07-10 ENCOUNTER — Ambulatory Visit (INDEPENDENT_AMBULATORY_CARE_PROVIDER_SITE_OTHER): Payer: Medicare Other | Admitting: Internal Medicine

## 2015-07-10 ENCOUNTER — Encounter: Payer: Self-pay | Admitting: Internal Medicine

## 2015-07-10 VITALS — BP 110/68 | HR 65 | Ht 66.0 in | Wt 183.0 lb

## 2015-07-10 DIAGNOSIS — R918 Other nonspecific abnormal finding of lung field: Secondary | ICD-10-CM

## 2015-07-10 NOTE — Progress Notes (Signed)
Subjective:    Patient ID: Caitlin Tanner, female    DOB: 10/05/1950, 64 y.o.   MRN: [id number]  HPI PCP Earney Mallet, MD Allergist : DR Allena Katz   HPI IOV 06/07/2013  67 old almost nonsmoking female referred by Dr. Allena Katz for question of pulmonary nodules multiple or less than 4 mm.   She is known to have asthma and is followed by Dr. Neldon Mc for the same. Chart review shows that in August 2013 do to respiratory infection flare and also chest x-ray findings suggesting interstitial lung disease changes in the right lung base a CT scan of the chest was done. This was read by Dr Lorin Picket who interpreted the scan as no evidence of interstitial lung disease but some patchy amorphous infiltrates likely postinfectious in the right upper lobe and some scattered pulmonary nodules less than 4 mm and recommended a scan in one year. She then had a followup scan the year later 04/26/2013 and this was interpreted by Dr. Maryland Pink and detailed below and there is some concern for new pulmonary nodules. However all these nodules are reported to be less than or equal to 4 mm in size. I have personally review the film and these nodules are so small on the monitor in the clinic but I cannot appreciate these.  According to her history asthma is very well controlled. Chart review shows that in November 2013 FEV1 was 1.4 L/54% predicted. Her last albuterol use was over a month ago. She's alpha-1 antitrypsin and MM type and normal levels. She is mildly elevated IgE; last at 206.   Of note, she does not have travel history to any states that involves soil fungi   History  Smoking status  . Former Smoker -- 0.10 packs/day for 3 years  . Types: Cigarettes  . Quit date: 09/30/1987  Smokeless tobacco  . 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  Comparison: CT chest dated 05/27/2012  Findings: Numerous small pulmonary nodules bilaterally, including:  --4 mm ground-glass nodule in the left upper lobe (series 3/image  13), new  --3 mm nodule in the anterior right upper lobe (series 3/image 18),  unchanged  --3 mm nodule in the anterior left upper lobe (series 3/image 19),  unchanged  --3 mm nodule in the central right lower lobe (series 3/image 32),  possibly new  --3 mm ground-glass nodule in the left lower lobe (series 3/image  34), possibly new  These findings may be related to reported history of RBILD on the  prior study. Alternatively, they could be  postinfectious/inflammatory possibly related to atypical  mycobacterial infection given mild scarring / nodularity in the  right middle lobe (series 3/image 38).  No pleural effusion or pneumothorax.  The visualized thyroid is unremarkable.  The heart is normal in size. No pericardial effusion.   12 mm precarinal node with preservation of the normal fatty hilum  (series 2/image 24), unchanged. No suspicious axillary  lymphadenopathy.  The visualized upper abdomen is notable for tiny bilateral renal  cysts, some of which are hemorrhagic.  Mild degenerative changes of the visualized thoracolumbar spine.   IMPRESSION:  Numerous small pulmonary nodules bilaterally measuring up to 4  mm,  as described above, some of which are new.  The overall appearance favors RBILD or possibly a  postinfectious/inflammatory etiology such as atypical mycobacterial  infection.  Given mild interval change, an additional follow-up CT chest is  suggested in 12 months.  Original Report Authenticated By: Julian Hy, M.D.    #Asthma - is well controlled  - per Dr Neldon Mc  - flu shot 06/07/2013  #Lung nodules  - no further followup but will confirm  this plan with dedicated chest radiologist and call you back - update: D/w Dr Rosario Jacks: she recommends fu CT to be on safe side. She recommended 6 months and dR Maryland Pink recommended 12 months; will doone in 9 mnth  #FOlllwup - based on phone call; likely no further active followup   OV 05/22/2014  Chief Complaint  Patient presents with  . Follow-up    Pt last seen in 05/2013. Pt had CT chest in 02/2014. Pt denies SOB and CP/tightness. Pt states she is having PND and cough in morning d/t seasonal allergies.     Followup multiple pulmonary micronodules. There is a 1 year followup. CT scan of the chest June 2015 reported below show stability and pulmonary nodules. She has no interim complaints except that a month ago she did have some bronchitis episode and was given 5 days of prednisone by her allergist Dr. Neldon Mc, she is compliant with her Qvar and other medications but she still continues to have postnasal drip and some chronic cough. These are being addressed by her allergist. The only reason for followup here is pulmonary nodules.   CT chest 03/22/14 IMPRESSION:  1. Similar appearance of tiny upper lobe predominant pulmonary  nodules, likely post infectious or inflammatory. A new area of  clustered nodularity in the posterior left upper lobe is favored to  represent an area of bronchiolitis. Mild infection, including  atypical etiologies favored.  2. Mild centrilobular emphysema.  Electronically Signed  By: Abigail Miyamoto M.D.  On: 03/22/2014 14:37    OV 07/10/2015  Chief Complaint  Patient presents with  . Follow-up    Pt here after CT chest in 05/2015. Pt states she has seasonal alleriges and states she has a dry cough that she is contributing to seasonal allergies. Pt denies SOB and CP/tightness.     Follow-up multiple pulmonary micronodules. She is following up after having her second CT chest which was done September 2016. The nodules are stable and unchanged from a year ago which  itself is unchanged from July 2014. She is largely symptomatically. She is on scheduled Advair with as needed albuterol. She says that her albuterol use is rare. There are no nocturnal symptoms or daytime symptoms from asthma. Is no wheezing. She does not have any exertional shortness of breath. She feels she is doing well overall. She is up-to-date with her flu shot  CT chest below was personally visualized by me    EXAM: CT CHEST WITHOUT CONTRAST  TECHNIQUE: Multidetector CT imaging of the chest was performed following the standard protocol without IV contrast.  COMPARISON: CT 03/22/2014  FINDINGS: Mediastinum/Nodes: No axillary or supraclavicular lymphadenopathy. No mediastinal or hilar adenopathy. Esophagus normal.  Lungs/Pleura: Branching nodularity in the LEFT upper lobe is slightly less thick than prior. No progression. Small peripheral nodule in the Right upper lobe measuring 2 mm on image 9, series 3 is unchanged. Additional scattered nodules are likewise unchanged. No new pulmonary nodules.  There is bibasilar bronchiectasis and mild linear scarring at the lung bases  similar to comparison exam  Upper abdomen: Multiple cysts of the kidneys are again demonstrated. Adrenal glands are normal.  Musculoskeletal: No aggressive osseous lesion.  IMPRESSION: 1. Stable bilateral pulmonary nodularity appear benign. 2. Stable mild bronchiectasis and linear scarring at the lung bases.   Electronically Signed  By: Suzy Bouchard M.D.  On: 06/12/2015 10:56           Vitals     Height Weight BMI (Calculated)            Interpretation Summary     CLINICAL DATA: Follow-up lung nodules. Emphysema.  EXAM: CT CHEST WITHOUT CONTRAST  TECHNIQUE: Multidetector CT imaging of the chest was performed following the standard protocol without IV contrast.  COMPARISON: CT 03/22/2014  FINDINGS: Mediastinum/Nodes: No axillary or  supraclavicular lymphadenopathy. No mediastinal or hilar adenopathy. Esophagus normal.  Lungs/Pleura: Branching nodularity in the LEFT upper lobe is slightly less thick than prior. No progression. Small peripheral nodule in the Right upper lobe measuring 2 mm on image 9, series 3 is unchanged. Additional scattered nodules are likewise unchanged. No new pulmonary nodules.  There is bibasilar bronchiectasis and mild linear scarring at the lung bases similar to comparison exam  Upper abdomen: Multiple cysts of the kidneys are again demonstrated. Adrenal glands are normal.  Musculoskeletal: No aggressive osseous lesion.  IMPRESSION: 1. Stable bilateral pulmonary nodularity appear benign. 2. Stable mild bronchiectasis and linear scarring at the lung bases.   Electronically Signed  By: Suzy Bouchard M.D.  On: 06/12/2015 10:56        reports that she quit smoking about 27 years ago. Her smoking use included Cigarettes. She has a .3 pack-year smoking history. She does not have any smokeless tobacco history on file.     Immunization History  Administered Date(s) Administered  . Influenza Split 06/25/2015  . Influenza Whole 06/29/2012  . Influenza,inj,Quad PF,36+ Mos 06/07/2013  . Pneumococcal-Unspecified 06/25/2015      Review of Systems   Nearly asymtptomatic    Objective:   Physical Exam  Constitutional: She is oriented to person, place, and time. She appears well-developed and well-nourished. No distress.  HENT:  Head: Normocephalic and atraumatic.  Right Ear: External ear normal.  Left Ear: External ear normal.  Mouth/Throat: Oropharynx is clear and moist. No oropharyngeal exudate.  Eyes: Conjunctivae and EOM are normal. Pupils are equal, round, and reactive to light. Right eye exhibits no discharge. Left eye exhibits no discharge. No scleral icterus.  Neck: Normal range of motion. Neck supple. No JVD present. No tracheal deviation present. No  thyromegaly present.  Cardiovascular: Normal rate, regular rhythm, normal heart sounds and intact distal pulses.  Exam reveals no gallop and no friction rub.   No murmur heard. Pulmonary/Chest: Effort normal and breath sounds normal. No respiratory distress. She has no wheezes. She has no rales. She exhibits no tenderness.  Abdominal: Soft. Bowel sounds are normal. She exhibits no distension and no mass. There is no tenderness. There is no rebound and no guarding.  Musculoskeletal: Normal range of motion. She exhibits no edema or tenderness.  Lymphadenopathy:    She has no cervical adenopathy.  Neurological: She is alert and oriented to person, place, and time. She has normal reflexes. No cranial nerve deficit. She exhibits normal muscle tone. Coordination normal.  Skin: Skin is warm and dry. No rash noted. She is not diaphoretic. No erythema. No pallor.  Psychiatric: She has a normal mood and affect. Her behavior is normal. Judgment and thought content normal.  Vitals reviewed.   Filed Vitals:   07/10/15 1439  BP: 110/68  Pulse: 65  Height: 5\' 6"  (1.676 m)  Weight: 183 lb (83.008 kg)  SpO2: 96%         Assessment & Plan:  #Asthma  - per Dr Neldon Mc -   #Lung nodules  -stable between July 2014 -> June 2015 -> Sept 2016 - no further followup  #FOlllwup As needed   Dr. Brand Males, M.D., [facility].C.P Pulmonary and Critical Care Medicine Staff Physician [facility] Pager: [phone], If no answer or between  15:00h - 7:00h: call [phone]  07/10/2015 3:01 PM

## 2015-07-10 NOTE — Patient Instructions (Addendum)
ICD-9-CM ICD-10-CM   1. Multiple lung nodules 793.19 R91.8      #Asthma  - per Dr Neldon Mc -   #Lung nodules  -stable between July 2014 -> June 2015 -> Sept 2016 - no further followup  #FOlllwup As needed

## 2015-07-17 ENCOUNTER — Ambulatory Visit: Payer: Self-pay | Admitting: Allergy and Immunology

## 2015-08-21 ENCOUNTER — Ambulatory Visit (INDEPENDENT_AMBULATORY_CARE_PROVIDER_SITE_OTHER): Payer: Medicare Other | Admitting: Allergy and Immunology

## 2015-08-21 ENCOUNTER — Encounter: Payer: Self-pay | Admitting: Allergy and Immunology

## 2015-08-21 VITALS — BP 130/82 | HR 74 | Resp 16

## 2015-08-21 DIAGNOSIS — J4541 Moderate persistent asthma with (acute) exacerbation: Secondary | ICD-10-CM | POA: Diagnosis not present

## 2015-08-21 DIAGNOSIS — H1010 Acute atopic conjunctivitis, unspecified eye: Secondary | ICD-10-CM | POA: Diagnosis not present

## 2015-08-21 DIAGNOSIS — K219 Gastro-esophageal reflux disease without esophagitis: Secondary | ICD-10-CM

## 2015-08-21 DIAGNOSIS — J387 Other diseases of larynx: Secondary | ICD-10-CM

## 2015-08-21 DIAGNOSIS — J309 Allergic rhinitis, unspecified: Secondary | ICD-10-CM

## 2015-08-21 MED ORDER — IPRATROPIUM BROMIDE 0.02 % IN SOLN
0.5000 mg | Freq: Once | RESPIRATORY_TRACT | Status: AC
  Administered 2015-08-21: 0.5 mg via RESPIRATORY_TRACT

## 2015-08-21 MED ORDER — ALBUTEROL SULFATE (2.5 MG/3ML) 0.083% IN NEBU
2.5000 mg | INHALATION_SOLUTION | Freq: Once | RESPIRATORY_TRACT | Status: AC
  Administered 2015-08-21: 2.5 mg via RESPIRATORY_TRACT

## 2015-08-21 MED ORDER — METHYLPREDNISOLONE ACETATE 80 MG/ML IJ SUSP
80.0000 mg | Freq: Once | INTRAMUSCULAR | Status: AC
  Administered 2015-08-21: 80 mg via INTRAMUSCULAR

## 2015-08-21 NOTE — Progress Notes (Signed)
[facility]  Follow-up Note  Refering Provider: Lavonia Dana, MD Primary Provider: Lavonia Dana, MD  Subjective:   Caitlin Tanner is a 64 y.o. female who returns to the [facility] [place] in re-evaluation of the following:  HPI Comments:  Caitlin Tanner returns to this clinic on 08/21/2015 in reevaluation of her asthma and allergic rhinitis and reflux-induced respiratory disease. I've not seen her in his clinic since July for the most part she thinks that she's done relatively well. However, she's had 2 weeks of nasal congestion and head fullness and cough and wheezing and chest congestion specially in the morning along with throat clearing. She's not had any anosmia or ugly nasal discharge or ugly sputum production or fever. She's been around individuals with a similar type of issue. Prior to this point in time she did not need to use any short acting bronchodilator in fact she felt that she was doing so well she's taper down her Advair to about 4 times per week. Her reflux is under pretty good control while using AcipHex. She started nose was doing well using Dymista.   Outpatient Encounter Prescriptions as of 08/21/2015  Medication Sig  . aspirin 81 MG tablet Take 81 mg by mouth daily.    . Azelastine-Fluticasone (DYMISTA) 137-50 MCG/ACT SUSP Place 1 spray into the nose daily.  . Cholecalciferol (VITAMIN D3) 2000 UNITS CHEW Chew 1 tablet by mouth daily.  Marland Kitchen escitalopram (LEXAPRO) 5 MG tablet Take 5 mg by mouth daily.  Marland Kitchen ezetimibe (ZETIA) 10 MG tablet Take 10 mg by mouth daily.    . fluticasone-salmeterol (ADVAIR HFA) 230-21 MCG/ACT inhaler Inhale 2 puffs into the lungs 2 (two) times daily.  Marland Kitchen loratadine (CLARITIN) 10 MG tablet Take 10 mg by mouth daily.  Marland Kitchen LORazepam (ATIVAN) 0.5 MG tablet Take 0.5 mg by mouth as needed.    Marland Kitchen losartan-hydrochlorothiazide (HYZAAR) 50-12.5 MG per tablet Take 1 tablet by mouth daily.    .  metoprolol (TOPROL-XL) 50 MG 24 hr tablet Take 50 mg by mouth daily.    . multivitamin (THERAGRAN) per tablet Take 1 tablet by mouth daily.    . Omega-3 Fatty Acids (FISH OIL) 1000 MG CAPS Take by mouth daily.    . RABEprazole (ACIPHEX) 20 MG tablet Take one tablet before breakfast daily. May take additional dose before evening meal if needed.  . beclomethasone (QVAR) 80 MCG/ACT inhaler Inhale 2 puffs into the lungs as needed.  . hyoscyamine (LEVSIN SL) 0.125 MG SL tablet Place 1 tablet (0.125 mg total) under the tongue every 6 (six) hours as needed for cramping. (Patient not taking: Reported on 08/21/2015)  . [EXPIRED] albuterol (PROVENTIL) (2.5 MG/3ML) 0.083% nebulizer solution 2.5 mg   . [EXPIRED] ipratropium (ATROVENT) nebulizer solution 0.5 mg   . [EXPIRED] methylPREDNISolone acetate (DEPO-MEDROL) injection 80 mg    No facility-administered encounter medications on file as of 08/21/2015.    Meds ordered this encounter  Medications  . ipratropium (ATROVENT) nebulizer solution 0.5 mg    Sig:   . albuterol (PROVENTIL) (2.5 MG/3ML) 0.083% nebulizer solution 2.5 mg    Sig:   . methylPREDNISolone acetate (DEPO-MEDROL) injection 80 mg    Sig:     Past Medical History  Diagnosis Date  . Anxiety disorder   . GERD (gastroesophageal reflux disease)   . Hypertension   . IBS (irritable bowel syndrome)   . Hemorrhoids   . Asthma   . S/P  colonoscopy 12/23/10    Dr Rourk-diverticulosis, anal canal hemorhhoids, anal papilla, random bx benign  . Diverticulosis   . S/P endoscopy 12/23/10    Dr Girard Cooter, tubular esophagus, sm HH, minimal retained food, bile-stained mucus, otherwise normal,, esophageal bx  benign    Past Surgical History  Procedure Laterality Date  . Tonsillectomy    . Left great toe surgery    . Cold knife conization    . Cholecystectomy    . Esophagogastroduodenoscopy  06/08/2006    single distal erosion otherwise normal  . Colonoscopy  06/08/2006    internal  hemorrhoids otherwise noraml colon and rectum  . Egd/tcs  11/2010    see PMH  . Wrist fracture surgery  07/2013    right  . Colonoscopy  11/2010    Dr. Gala Romney: Anal canal hemorrhoids, diverticulosis, random colon biopsies unremarkable.  . Esophagogastroduodenoscopy  11/2010    Dr. Gala Romney: Longitudinal for reading of the tubular esophagus, no evidence of eosinophilic esophagitis on biopsy, small hiatal hernia, minimal amount of retained food, bile-stained mucosa.    Allergies  Allergen Reactions  . Conj Estrog-Medroxyprogest Ace     REACTION: UNKNOWN REACTION  . Ketorolac Tromethamine     REACTION: UNKNOWN REACTION  . Tramadol Hcl     REACTION: UNKNOWN REACTION    Review of Systems  Constitutional: Negative.   HENT: Positive for congestion and sneezing.   Eyes: Negative.   Respiratory: Positive for cough and wheezing.   Cardiovascular: Negative.   Gastrointestinal: Negative.   Musculoskeletal: Negative.   Skin: Negative.      Objective:   Filed Vitals:   08/21/15 1127  BP: 130/82  Pulse: 74  Resp: 16          Physical Exam  Constitutional: She appears well-developed and well-nourished. No distress.  HENT:  Head: Normocephalic and atraumatic. Head is without right periorbital erythema and without left periorbital erythema.  Right Ear: Tympanic membrane, external ear and ear canal normal. No drainage or tenderness. No foreign bodies. Tympanic membrane is not injected, not scarred, not perforated, not erythematous, not retracted and not bulging. No middle ear effusion.  Left Ear: Tympanic membrane, external ear and ear canal normal. No drainage or tenderness. No foreign bodies. Tympanic membrane is not injected, not scarred, not perforated, not erythematous, not retracted and not bulging.  No middle ear effusion.  Nose: Mucosal edema present. No rhinorrhea, nose lacerations or sinus tenderness.  No foreign bodies.  Mouth/Throat: Oropharynx is clear and moist. No  oropharyngeal exudate, posterior oropharyngeal edema, posterior oropharyngeal erythema or tonsillar abscesses.  Eyes: Lids are normal. Right eye exhibits no chemosis, no discharge and no exudate. No foreign body present in the right eye. Left eye exhibits no chemosis, no discharge and no exudate. No foreign body present in the left eye. Right conjunctiva is not injected. Left conjunctiva is not injected.  Neck: Neck supple. No tracheal tenderness present. No tracheal deviation and no edema present. No thyroid mass and no thyromegaly present.  Cardiovascular: Normal rate, regular rhythm, S1 normal and S2 normal.  Exam reveals no gallop.   No murmur heard. Pulmonary/Chest: No accessory muscle usage or stridor. No respiratory distress. She has wheezes (Bilateral expiratory wheezes heard in all lung fields). She has no rhonchi. She has no rales.  Abdominal: Soft.  Lymphadenopathy:       Head (right side): No tonsillar adenopathy present.       Head (left side): No tonsillar adenopathy present.    She has  no cervical adenopathy.  Neurological: She is alert.  Skin: No rash noted. She is not diaphoretic.  Psychiatric: She has a normal mood and affect. Her behavior is normal.    Diagnostics:    Spirometry was performed and demonstrated an FEV1 of 1.41 at 56 % of predicted.  The patient had an Asthma Control Test with the following results: ACT Total Score: 23.    Assessment and Plan:   1. Moderate persistent asthma, with acute exacerbation   2. Allergic rhinoconjunctivitis   3. LPRD (laryngopharyngeal reflux disease)      1. Nebulizer now - ipratropium + albuterol  2. depomedrol IM now  3. Consistently use Advair 230 two inhalations two times per day.  4. Add Qvar 80 two inhalations two times per day to Advair during 'asthma flare up'  5. Continue Aciphex 20mg  one tablet one or two times per day depending on reflux activity  6. Continue dymista one spray each nostril 1-2 times per  day  7. Use ProAir HFA if needed.  8. Return in three months or earlier if problem   I treated June with some systemic steroids and a nebulizer treatment with dual bronchodilators to help her get over her recent asthma exacerbation which appeared to be triggered by a viral upper respiratory tract infection. She will keep in contact me noting her response to the a for mentioned therapy and we'll make a decision about how to proceed pending her response.   Allena Katz, MD [place]

## 2015-08-21 NOTE — Patient Instructions (Signed)
  1. Nebulizer now - ipra + alb  2. depomedrol IM now  3. Consistently use Advair 230 two inhalations two times per day.  4. Add Qvar 80 two inhalations two times per day to Advair during 'asthma flare up'  5. Continue Aciphex 20mg  one tablet one or two times per day depending on reflux activity  6. Continue dymista one spray each nostril 1-2 times per day  7. Use ProAir HFA if needed.  8. Return in three months or earlier if problem

## 2015-10-16 ENCOUNTER — Other Ambulatory Visit: Payer: Self-pay

## 2015-10-16 MED ORDER — ALBUTEROL SULFATE HFA 108 (90 BASE) MCG/ACT IN AERS: INHALATION_SPRAY | RESPIRATORY_TRACT | Status: DC

## 2015-11-20 ENCOUNTER — Ambulatory Visit: Payer: Medicare Other | Admitting: Allergy and Immunology

## 2015-11-27 ENCOUNTER — Encounter: Payer: Self-pay | Admitting: Allergy and Immunology

## 2015-11-27 ENCOUNTER — Ambulatory Visit (INDEPENDENT_AMBULATORY_CARE_PROVIDER_SITE_OTHER): Payer: Medicare Other | Admitting: Allergy and Immunology

## 2015-11-27 VITALS — BP 128/98 | HR 80 | Resp 16

## 2015-11-27 DIAGNOSIS — J4540 Moderate persistent asthma, uncomplicated: Secondary | ICD-10-CM

## 2015-11-27 DIAGNOSIS — H1010 Acute atopic conjunctivitis, unspecified eye: Secondary | ICD-10-CM

## 2015-11-27 DIAGNOSIS — J387 Other diseases of larynx: Secondary | ICD-10-CM

## 2015-11-27 DIAGNOSIS — K219 Gastro-esophageal reflux disease without esophagitis: Secondary | ICD-10-CM

## 2015-11-27 DIAGNOSIS — J309 Allergic rhinitis, unspecified: Secondary | ICD-10-CM | POA: Diagnosis not present

## 2015-11-27 NOTE — Progress Notes (Signed)
Follow-up Note  Referring Provider: Lavonia Dana, MD Primary Provider: Lavonia Dana, MD Date of Office Visit: 11/27/2015  Subjective:   Caitlin Tanner (DOB: [birth year]) is a 65 y.o. female who returns to the [facility] on 11/27/2015 in re-evaluation of the following:  HPI Comments: Keshayla returns to this clinic in reevaluation of her asthma and allergic rhinitis and LPR. I last saw her in this clinic in November 2016. She did have an exacerbation of her upper airway disease cold sinusitis and treated with an antibiotic successfully around Christmas. As well, this Saturday she develop problems with nasal congestion and a very slight cough. She is not had to use her short-acting bronchodilator. In fact, she is done very well with her asthma and rarely uses any short acting bronchodilator while consistently using Advair and rarely adding and Qvar as part of an action plan. In addition, her reflux is under very good control while using her AcipHex usually once a day but sometimes twice a day and she's had very little problems with her nose while intermittently using dymista. She did obtain the flu vaccine.   Outpatient Prescriptions Prior to Visit  Medication Sig Dispense Refill  . albuterol (PROAIR HFA) 108 (90 Base) MCG/ACT inhaler Use 2 puffs every 4 hours as needed for cough or wheeze.  May use 2 puffs 10-20 minutes prior to exercise. 1 Inhaler 1  . aspirin 81 MG tablet Take 81 mg by mouth daily.      . Azelastine-Fluticasone (DYMISTA) 137-50 MCG/ACT SUSP Place 1 spray into the nose daily.    . beclomethasone (QVAR) 80 MCG/ACT inhaler Inhale 2 puffs into the lungs as needed.    . Cholecalciferol (VITAMIN D3) 2000 UNITS CHEW Chew 1 tablet by mouth daily.    Marland Kitchen escitalopram (LEXAPRO) 5 MG tablet Take 5 mg by mouth daily.    Marland Kitchen ezetimibe (ZETIA) 10 MG tablet Take 10 mg by mouth daily.      . fluticasone-salmeterol (ADVAIR HFA) 230-21 MCG/ACT inhaler Inhale 2 puffs into  the lungs 2 (two) times daily.    Marland Kitchen loratadine (CLARITIN) 10 MG tablet Take 10 mg by mouth daily.    Marland Kitchen LORazepam (ATIVAN) 0.5 MG tablet Take 0.5 mg by mouth as needed.      Marland Kitchen losartan-hydrochlorothiazide (HYZAAR) 50-12.5 MG per tablet Take 1 tablet by mouth daily.      . metoprolol (TOPROL-XL) 50 MG 24 hr tablet Take 50 mg by mouth daily.      . multivitamin (THERAGRAN) per tablet Take 1 tablet by mouth daily.      . Omega-3 Fatty Acids (FISH OIL) 1000 MG CAPS Take by mouth daily.      . RABEprazole (ACIPHEX) 20 MG tablet Take one tablet before breakfast daily. May take additional dose before evening meal if needed. 180 tablet 3  . hyoscyamine (LEVSIN SL) 0.125 MG SL tablet Place 1 tablet (0.125 mg total) under the tongue every 6 (six) hours as needed for cramping. (Patient not taking: Reported on 08/21/2015) 120 tablet 5   No facility-administered medications prior to visit.    Meds ordered this encounter  Medications  . LORazepam (ATIVAN) 1 MG tablet    Sig: TAKE 1/2 A TABLET BY MOUTH AT BEDTIME AS NEEDED ONCE A DAY    Refill:  2  . chlorpheniramine-HYDROcodone (TUSSIONEX) 10-8 MG/5ML SUER    Sig: as needed.    Refill:  0    Past Medical History  Diagnosis  Date  . Anxiety disorder   . GERD (gastroesophageal reflux disease)   . Hypertension   . IBS (irritable bowel syndrome)   . Hemorrhoids   . Asthma   . S/P colonoscopy 12/23/10    Dr Rourk-diverticulosis, anal canal hemorhhoids, anal papilla, random bx benign  . Diverticulosis   . S/P endoscopy 12/23/10    Dr Girard Cooter, tubular esophagus, sm HH, minimal retained food, bile-stained mucus, otherwise normal,, esophageal bx  benign    Past Surgical History  Procedure Laterality Date  . Tonsillectomy    . Left great toe surgery    . Cold knife conization    . Cholecystectomy    . Esophagogastroduodenoscopy  06/08/2006    single distal erosion otherwise normal  . Colonoscopy  06/08/2006    internal hemorrhoids otherwise  noraml colon and rectum  . Egd/tcs  11/2010    see PMH  . Wrist fracture surgery  07/2013    right  . Colonoscopy  11/2010    Dr. Gala Romney: Anal canal hemorrhoids, diverticulosis, random colon biopsies unremarkable.  . Esophagogastroduodenoscopy  11/2010    Dr. Gala Romney: Longitudinal for reading of the tubular esophagus, no evidence of eosinophilic esophagitis on biopsy, small hiatal hernia, minimal amount of retained food, bile-stained mucosa.    Allergies  Allergen Reactions  . Conj Estrog-Medroxyprogest Ace     REACTION: UNKNOWN REACTION  . Ketorolac Tromethamine     REACTION: UNKNOWN REACTION  . Tramadol Hcl     REACTION: UNKNOWN REACTION    Review of systems negative except as noted in HPI / PMHx or noted below:  Review of Systems  Constitutional: Negative.   HENT: Negative.   Eyes: Negative.   Respiratory: Negative.   Cardiovascular: Negative.   Gastrointestinal: Negative.   Genitourinary: Negative.   Musculoskeletal: Negative.   Skin: Negative.   Neurological: Negative.   Endo/Heme/Allergies: Negative.   Psychiatric/Behavioral: Negative.      Objective:   Filed Vitals:   11/27/15 1137  BP: 128/98  Pulse: 80  Resp: 16          Physical Exam  Constitutional: She is well-developed, well-nourished, and in no distress.  HENT:  Head: Normocephalic.  Right Ear: Tympanic membrane, external ear and ear canal normal.  Left Ear: Tympanic membrane, external ear and ear canal normal.  Nose: Mucosal edema (Erythematous) present. No rhinorrhea.  Mouth/Throat: Uvula is midline, oropharynx is clear and moist and mucous membranes are normal. No oropharyngeal exudate.  Eyes: Conjunctivae are normal.  Neck: Trachea normal. No tracheal tenderness present. No tracheal deviation present. No thyromegaly present.  Cardiovascular: Normal rate, regular rhythm, S1 normal, S2 normal and normal heart sounds.   No murmur heard. Pulmonary/Chest: Breath sounds normal. No stridor. No  respiratory distress. She has no wheezes. She has no rales.  Musculoskeletal: She exhibits no edema.  Lymphadenopathy:       Head (right side): No tonsillar adenopathy present.       Head (left side): No tonsillar adenopathy present.    She has no cervical adenopathy.    She has no axillary adenopathy.  Neurological: She is alert. Gait normal.  Skin: No rash noted. She is not diaphoretic. No erythema. Nails show no clubbing.  Psychiatric: Mood and affect normal.    Diagnostics:    Spirometry was performed and demonstrated an FEV1 of 1.43 at 56 % of predicted.  The patient had an Asthma Control Test with the following results:  .    Assessment and Plan:  1. Asthma, moderate persistent, well-controlled   2. Allergic rhinoconjunctivitis   3. LPRD (laryngopharyngeal reflux disease)     1. Consistently use Advair 230 two inhalations two times per day.  2. Add Qvar 80 two inhalations two times per day to Advair during 'asthma flare up'  3. Continue Aciphex 20mg  one tablet one or two times per day depending on reflux activity  4. Continue dymista one spray each nostril 1-2 times per day  5. Use ProAir HFA if needed.  6. Return in July or earlier if problem   I will assume that Filomena has a viral respiratory tract infection and treat her very conservatively without any antibiotics or systemic steroids at this point. I did inform her that she should add in her Qvar to her Advair at this point in time as a preventative measure. She will continue on therapy for reflux and for her upper airway disease as noted above. I will see her back in this clinic in July or earlier if there is a problem.  Allena Katz, MD [place]

## 2015-11-27 NOTE — Patient Instructions (Signed)
  1. Consistently use Advair 230 two inhalations two times per day.  2. Add Qvar 80 two inhalations two times per day to Advair during 'asthma flare up'  3. Continue Aciphex 20mg  one tablet one or two times per day depending on reflux activity  4. Continue dymista one spray each nostril 1-2 times per day  5. Use ProAir HFA if needed.  6. Return in July or earlier if problem

## 2016-02-12 ENCOUNTER — Encounter: Payer: Self-pay | Admitting: Internal Medicine

## 2016-04-03 ENCOUNTER — Other Ambulatory Visit: Payer: Self-pay

## 2016-04-03 MED ORDER — RABEPRAZOLE SODIUM 20 MG PO TBEC: DELAYED_RELEASE_TABLET | ORAL | Status: DC

## 2016-04-08 ENCOUNTER — Ambulatory Visit (INDEPENDENT_AMBULATORY_CARE_PROVIDER_SITE_OTHER): Payer: Medicare Other | Admitting: Allergy and Immunology

## 2016-04-08 ENCOUNTER — Encounter: Payer: Self-pay | Admitting: Allergy and Immunology

## 2016-04-08 VITALS — BP 120/80 | HR 76 | Resp 16

## 2016-04-08 DIAGNOSIS — H1010 Acute atopic conjunctivitis, unspecified eye: Secondary | ICD-10-CM | POA: Diagnosis not present

## 2016-04-08 DIAGNOSIS — J309 Allergic rhinitis, unspecified: Secondary | ICD-10-CM | POA: Diagnosis not present

## 2016-04-08 DIAGNOSIS — J4540 Moderate persistent asthma, uncomplicated: Secondary | ICD-10-CM

## 2016-04-08 DIAGNOSIS — K219 Gastro-esophageal reflux disease without esophagitis: Secondary | ICD-10-CM

## 2016-04-08 DIAGNOSIS — J387 Other diseases of larynx: Secondary | ICD-10-CM | POA: Diagnosis not present

## 2016-04-08 MED ORDER — FLUTICASONE-SALMETEROL 230-21 MCG/ACT IN AERO: 2.0000 | INHALATION_SPRAY | Freq: Two times a day (BID) | RESPIRATORY_TRACT | Status: DC

## 2016-04-08 NOTE — Patient Instructions (Addendum)
  1. Consistently use Advair 230 two inhalations two times per day.  2. Add Qvar 80 two inhalations two times per day to Advair during 'asthma flare up'  3. Continue Aciphex 20mg  one tablet one or two times per day depending on reflux activity  4. Continue dymista one spray each nostril 1-2 times per day  5. Use ProAir HFA if needed.  6. Return in December 2017 or earlier if problem   7. Obtain fall flu vaccine

## 2016-04-08 NOTE — Progress Notes (Signed)
Follow-up Note  Referring Provider: Lavonia Dana, MD Primary Provider: Lavonia Dana, MD Date of Office Visit: 04/08/2016  Subjective:   Caitlin Tanner (DOB: 12/27/1950) is a 65 y.o. female who returns to the [facility] on 04/08/2016 in re-evaluation of the following:  HPI: Caitlin Tanner returns to this clinic in evaluation of her asthma and allergic rhinitis and LPR. I've not seen her in his clinic since February 2017.  During the interval she has not had an asthma exacerbation requiring a systemic steroid and she rarely uses a short acting bronchodilator and can exert herself without any problem. Her current controller therapy includes Advair which she is only using one time per day at this point in time. She has not had activate an action plan which includes the use of Qvar added into her Advair.  Her nose has been doing quite well while using a combination nasal steroid and antihistamine on occasion. She has not required an antibiotic to treat an episode of sinusitis  Her reflux is under pretty good control at this point in time while consistently using AcipHex at least one time per day and occasionally twice a day. Sometimes she has a little bit of problem with throat clearing and a little drainage in the back of her throat when she wakes up in the morning but for the most part she thinks her reflux is under excellent control.    Medication List           albuterol 108 (90 Base) MCG/ACT inhaler  Commonly known as:  PROAIR HFA  Use 2 puffs every 4 hours as needed for cough or wheeze.  May use 2 puffs 10-20 minutes prior to exercise.     aspirin 81 MG tablet  Take 81 mg by mouth daily.     beclomethasone 80 MCG/ACT inhaler  Commonly known as:  QVAR  Inhale 2 puffs into the lungs as needed. Reported on 04/08/2016     chlorpheniramine-HYDROcodone 10-8 MG/5ML Suer  Commonly known as:  TUSSIONEX  as needed.     DYMISTA 137-50 MCG/ACT Susp  Generic drug:   Azelastine-Fluticasone  Place 1 spray into the nose daily.     escitalopram 5 MG tablet  Commonly known as:  LEXAPRO  Take 5 mg by mouth daily.     Fish Oil 1000 MG Caps  Take by mouth daily.     fluticasone-salmeterol 230-21 MCG/ACT inhaler  Commonly known as:  ADVAIR HFA  Inhale 2 puffs into the lungs 2 (two) times daily.     loratadine 10 MG tablet  Commonly known as:  CLARITIN  Take 10 mg by mouth daily.     LORazepam 0.5 MG tablet  Commonly known as:  ATIVAN  Take 0.5 mg by mouth as needed.     losartan-hydrochlorothiazide 50-12.5 MG tablet  Commonly known as:  HYZAAR  Take 1 tablet by mouth daily.     metoprolol succinate 50 MG 24 hr tablet  Commonly known as:  TOPROL-XL  Take 50 mg by mouth daily.     multivitamin per tablet  Take 1 tablet by mouth daily.     RABEprazole 20 MG tablet  Commonly known as:  ACIPHEX  Take one tablet before breakfast daily. May take additional dose before evening meal if needed.     Vitamin D3 2000 units Chew  Chew 1 tablet by mouth daily.     ZETIA 10 MG tablet  Generic drug:  ezetimibe  Take 10 mg by mouth daily.        Past Medical History  Diagnosis Date  . Anxiety disorder   . GERD (gastroesophageal reflux disease)   . Hypertension   . IBS (irritable bowel syndrome)   . Hemorrhoids   . Asthma   . S/P colonoscopy 12/23/10    Dr Caitlin Tanner-diverticulosis, anal canal hemorhhoids, anal papilla, random bx benign  . Diverticulosis   . S/P endoscopy 12/23/10    Dr Caitlin Tanner, tubular esophagus, sm HH, minimal retained food, bile-stained mucus, otherwise normal,, esophageal bx  benign    Past Surgical History  Procedure Laterality Date  . Tonsillectomy    . Left great toe surgery    . Cold knife conization    . Cholecystectomy    . Esophagogastroduodenoscopy  06/08/2006    single distal erosion otherwise normal  . Colonoscopy  06/08/2006    internal hemorrhoids otherwise noraml colon and rectum  . Egd/tcs  11/2010    see  PMH  . Wrist fracture surgery  07/2013    right  . Colonoscopy  11/2010    Dr. Gala Tanner: Anal canal hemorrhoids, diverticulosis, random colon biopsies unremarkable.  . Esophagogastroduodenoscopy  11/2010    Dr. Gala Tanner: Longitudinal for reading of the tubular esophagus, no evidence of eosinophilic esophagitis on biopsy, small hiatal hernia, minimal amount of retained food, bile-stained mucosa.    Allergies  Allergen Reactions  . Conj Estrog-Medroxyprogest Ace     REACTION: UNKNOWN REACTION  . Ketorolac Tromethamine     REACTION: UNKNOWN REACTION  . Tramadol Hcl     REACTION: UNKNOWN REACTION    Review of systems negative except as noted in HPI / PMHx or noted below:  Review of Systems  Constitutional: Negative.   HENT: Negative.   Eyes: Negative.   Respiratory: Negative.   Cardiovascular: Negative.   Gastrointestinal: Negative.   Genitourinary: Negative.   Musculoskeletal: Negative.   Skin: Negative.   Neurological: Negative.   Endo/Heme/Allergies: Negative.   Psychiatric/Behavioral: Negative.      Objective:   Filed Vitals:   04/08/16 1410  BP: 120/80  Pulse: 76  Resp: 16          Physical Exam  Constitutional: She is well-developed, well-nourished, and in no distress.  HENT:  Head: Normocephalic.  Right Ear: Tympanic membrane, external ear and ear canal normal.  Left Ear: Tympanic membrane, external ear and ear canal normal.  Nose: Nose normal. No mucosal edema or rhinorrhea.  Mouth/Throat: Uvula is midline, oropharynx is clear and moist and mucous membranes are normal. No oropharyngeal exudate.  Eyes: Conjunctivae are normal.  Neck: Trachea normal. No tracheal tenderness present. No tracheal deviation present. No thyromegaly present.  Cardiovascular: Normal rate, regular rhythm, S1 normal, S2 normal and normal heart sounds.   No murmur heard. Pulmonary/Chest: Breath sounds normal. No stridor. No respiratory distress. She has no wheezes. She has no rales.    Musculoskeletal: She exhibits no edema.  Lymphadenopathy:       Head (right side): No tonsillar adenopathy present.       Head (left side): No tonsillar adenopathy present.    She has no cervical adenopathy.  Neurological: She is alert. Gait normal.  Skin: No rash noted. She is not diaphoretic. No erythema. Nails show no clubbing.  Psychiatric: Mood and affect normal.    Diagnostics:    Spirometry was performed and demonstrated an FEV1 of 1.47 at 60 % of predicted.  The patient had an Asthma Control Test with the  following results: ACT Total Score: 24.    Assessment and Plan:   1. Asthma, moderate persistent, well-controlled   2. Allergic rhinoconjunctivitis   3. LPRD (laryngopharyngeal reflux disease)     1. Consistently use Advair 230 two inhalations two times per day.  2. Add Qvar 80 two inhalations two times per day to Advair during 'asthma flare up'  3. Continue Aciphex 20mg  one tablet one or two times per day depending on reflux activity  4. Continue dymista one spray each nostril 1-2 times per day  5. Use ProAir HFA if needed.  6. Return in December 2017 or earlier if problem   7. Obtain fall flu vaccine  Overall Kathlena has been doing relatively well while consistently addressing the issue with her asthma and allergic rhinoconjunctivitis and LPR on her current medical therapy. I did encourage her to use Advair twice a day but she has a consistent problem using medications on a consistent basis and she appears to be doing okay on her Advair one time per day. I'll see her back in this clinic in December 2017 or earlier if there is a problem.  Allena Katz, MD [place]

## 2016-07-08 ENCOUNTER — Other Ambulatory Visit: Payer: Self-pay | Admitting: Dermatology

## 2016-08-06 ENCOUNTER — Other Ambulatory Visit: Payer: Self-pay

## 2016-08-06 MED ORDER — RABEPRAZOLE SODIUM 20 MG PO TBEC: DELAYED_RELEASE_TABLET | ORAL | 3 refills | Status: DC

## 2016-09-09 ENCOUNTER — Ambulatory Visit: Payer: Medicare Other | Admitting: Allergy and Immunology

## 2016-10-28 ENCOUNTER — Encounter (INDEPENDENT_AMBULATORY_CARE_PROVIDER_SITE_OTHER): Payer: Self-pay

## 2016-10-28 ENCOUNTER — Ambulatory Visit (INDEPENDENT_AMBULATORY_CARE_PROVIDER_SITE_OTHER): Payer: Medicare Other | Admitting: Allergy and Immunology

## 2016-10-28 ENCOUNTER — Encounter: Payer: Self-pay | Admitting: Allergy and Immunology

## 2016-10-28 VITALS — BP 150/96 | HR 68 | Resp 16

## 2016-10-28 DIAGNOSIS — J3089 Other allergic rhinitis: Secondary | ICD-10-CM

## 2016-10-28 DIAGNOSIS — J4541 Moderate persistent asthma with (acute) exacerbation: Secondary | ICD-10-CM | POA: Diagnosis not present

## 2016-10-28 DIAGNOSIS — K219 Gastro-esophageal reflux disease without esophagitis: Secondary | ICD-10-CM | POA: Diagnosis not present

## 2016-10-28 MED ORDER — AZELASTINE-FLUTICASONE 137-50 MCG/ACT NA SUSP: NASAL | 1 refills | Status: DC

## 2016-10-28 MED ORDER — AZELASTINE-FLUTICASONE 137-50 MCG/ACT NA SUSP: NASAL | 0 refills | Status: DC

## 2016-10-28 NOTE — Progress Notes (Signed)
Follow-up Note  Referring Provider: Lavonia Dana, MD Primary Provider: Lavonia Dana, MD Date of Office Visit: 10/28/2016  Subjective:   Caitlin Tanner (DOB: [birth year]) is a 66 y.o. female who returns to the [facility] on 10/28/2016 in re-evaluation of the following:  HPI: Berthena returns to this clinic in reevaluation of her asthma and allergic rhinitis and LPR.I have not seen her int his clinic since November 2017.  She was doing relatively well while consistently using Advair at least one time per day and continuing on AcipHex at least one time per day. She rarely uses any short acting bronchodilator and she can exert herself without any problem. Unfortunately, somewhere around Christmas time she developed a febrile illness associated with chills and head congestion and coughing requiring evaluation at an urgent care center and delivery of a systemic steroid and an antibiotic. She still continues to have cough although it is somewhat improved. She has soreness in the anterior part of her chest from coughing. There is no pleuritic quality to the soreness. She does have a runny nose and she still has throat clearing. She did not add her Qvar to her Flovent when she became ill and she still continues on AcipHex just one time per day. Presently she thinks that her reflux is under good control.  She did receive the flu vaccine this year.  Allergies as of 10/28/2016      Reactions   Conj Estrog-medroxyprogest Ace    REACTION: UNKNOWN REACTION   Ketorolac Tromethamine    REACTION: UNKNOWN REACTION   Tramadol Hcl    REACTION: UNKNOWN REACTION      Medication List      albuterol 108 (90 Base) MCG/ACT inhaler Commonly known as:  PROAIR HFA Use 2 puffs every 4 hours as needed for cough or wheeze.  May use 2 puffs 10-20 minutes prior to exercise.   aspirin 81 MG tablet Take 81 mg by mouth daily.   AZO CRANBERRY PO Take by mouth as needed.   beclomethasone 80  MCG/ACT inhaler Commonly known as:  QVAR Inhale 2 puffs into the lungs as needed. Reported on 04/08/2016   escitalopram 5 MG tablet Commonly known as:  LEXAPRO Take 5 mg by mouth daily.   Fish Oil 1000 MG Caps Take by mouth daily.   fluticasone-salmeterol 230-21 MCG/ACT inhaler Commonly known as:  ADVAIR HFA Inhale 2 puffs into the lungs 2 (two) times daily.   loratadine 10 MG tablet Commonly known as:  CLARITIN Take 10 mg by mouth daily.   losartan-hydrochlorothiazide 50-12.5 MG tablet Commonly known as:  HYZAAR Take 1 tablet by mouth daily.   metoprolol succinate 50 MG 24 hr tablet Commonly known as:  TOPROL-XL Take 50 mg by mouth daily.   multivitamin per tablet Take 1 tablet by mouth daily.   NASAL SALINE NA Place into the nose as needed.   RABEprazole 20 MG tablet Commonly known as:  ACIPHEX Take one tablet before breakfast daily. May take additional dose before evening meal if needed.   Vitamin D3 2000 units Chew Chew 1 tablet by mouth daily.   ZETIA 10 MG tablet Generic drug:  ezetimibe Take 10 mg by mouth daily.       Past Medical History:  Diagnosis Date  . Anxiety disorder   . Asthma   . Diverticulosis   . GERD (gastroesophageal reflux disease)   . Hemorrhoids   . Hypertension   . IBS (irritable bowel syndrome)   .  S/P colonoscopy 12/23/10   Dr Rourk-diverticulosis, anal canal hemorhhoids, anal papilla, random bx benign  . S/P endoscopy 12/23/10   Dr Girard Cooter, tubular esophagus, sm HH, minimal retained food, bile-stained mucus, otherwise normal,, esophageal bx  benign    Past Surgical History:  Procedure Laterality Date  . CHOLECYSTECTOMY    . cold knife conization    . COLONOSCOPY  06/08/2006   internal hemorrhoids otherwise noraml colon and rectum  . COLONOSCOPY  11/2010   Dr. Gala Romney: Anal canal hemorrhoids, diverticulosis, random colon biopsies unremarkable.  . egd/tcs  11/2010   see PMH  . ESOPHAGOGASTRODUODENOSCOPY  06/08/2006   single  distal erosion otherwise normal  . ESOPHAGOGASTRODUODENOSCOPY  11/2010   Dr. Gala Romney: Longitudinal for reading of the tubular esophagus, no evidence of eosinophilic esophagitis on biopsy, small hiatal hernia, minimal amount of retained food, bile-stained mucosa.  . left great toe surgery    . TONSILLECTOMY    . WRIST FRACTURE SURGERY  07/2013   right    Review of systems negative except as noted in HPI / PMHx or noted below:  Review of Systems  Constitutional: Negative.   HENT: Negative.   Eyes: Negative.   Respiratory: Negative.   Cardiovascular: Negative.   Gastrointestinal: Negative.   Genitourinary: Negative.   Musculoskeletal: Negative.   Skin: Negative.   Neurological: Negative.   Endo/Heme/Allergies: Negative.   Psychiatric/Behavioral: Negative.      Objective:   Vitals:   10/28/16 1015  BP: (!) 150/96  Pulse: 68  Resp: 16          Physical Exam  Constitutional: She is well-developed, well-nourished, and in no distress.  HENT:  Head: Normocephalic.  Right Ear: Tympanic membrane, external ear and ear canal normal.  Left Ear: Tympanic membrane, external ear and ear canal normal.  Nose: Nose normal. No mucosal edema or rhinorrhea.  Mouth/Throat: Uvula is midline, oropharynx is clear and moist and mucous membranes are normal. No oropharyngeal exudate.  Eyes: Conjunctivae are normal.  Neck: Trachea normal. No tracheal tenderness present. No tracheal deviation present. No thyromegaly present.  Cardiovascular: Normal rate, regular rhythm, S1 normal, S2 normal and normal heart sounds.   No murmur heard. Pulmonary/Chest: No stridor. No respiratory distress. She has wheezes (expiratory wheeze left mid posterior lung field). She has no rales.  Musculoskeletal: She exhibits no edema.  Lymphadenopathy:       Head (right side): No tonsillar adenopathy present.       Head (left side): No tonsillar adenopathy present.    She has no cervical adenopathy.  Neurological: She  is alert. Gait normal.  Skin: No rash noted. She is not diaphoretic. No erythema. Nails show no clubbing.  Psychiatric: Mood and affect normal.    Diagnostics:    Spirometry was performed and demonstrated an FEV1 of 1.29 at 53 % of predicted.  Assessment and Plan:   1. Asthma, not well controlled, moderate persistent, with acute exacerbation   2. Other allergic rhinitis   3. LPRD (laryngopharyngeal reflux disease)     1. Consistently use Advair 230 two inhalations two times per day.  2. Add Qvar 80 two inhalations two times per day to Advair during 'asthma flare up'  3. Continue Aciphex 20mg  one tablet one or two times per day depending on reflux activity  4. Continue dymista one spray each nostril 1-2 times per day  5. Use ProAir HFA if needed.  6. Prednisone 10 mg - 2 tablets once a day for the next 10 days  7. Further evaluation? Contact clinic in the next 2 weeks with report  8. Return in Summer 2018 or earlier if problem    Arla has inflammation of her airway and I will treat her once again with some systemic steroids in addition to having her use of high-dose inhaled steroids and also a have her nbe a little bit more aggressive about treating her reflux with a proton pump inhibitor twice a day while she is ill. I will assume that this will result in resolution of all her symptoms in the next 2 weeks and then I will see her back in this clinic in the summer of 2018 or earlier if there is a problem. I've asked her to contact us over the next 2 weeks with a report regarding her symptoms.  Allena Katz, MD [place]

## 2016-10-28 NOTE — Patient Instructions (Addendum)
  1. Consistently use Advair 230 two inhalations two times per day.  2. Add Qvar 80 two inhalations two times per day to Advair during 'asthma flare up'  3. Continue Aciphex 20mg  one tablet one or two times per day depending on reflux activity  4. Continue dymista one spray each nostril 1-2 times per day  5. Use ProAir HFA if needed.  6. Prednisone 10 mg - 2 tablets once a day for the next 10 days  7. Further evaluation? Contact clinic in the next 2 weeks with report  8. Return in Summer 2018 or earlier if problem

## 2017-03-17 ENCOUNTER — Ambulatory Visit: Payer: Medicare Other | Admitting: Allergy and Immunology

## 2017-04-27 ENCOUNTER — Telehealth: Payer: Self-pay | Admitting: Allergy and Immunology

## 2017-04-27 NOTE — Telephone Encounter (Signed)
Pt paid in full - she will call [facility] again - told her we would refund if they pay - kt

## 2017-04-27 NOTE — Telephone Encounter (Signed)
Please call pt regarding a bill received. We show a zero balance.

## 2017-05-05 ENCOUNTER — Ambulatory Visit (INDEPENDENT_AMBULATORY_CARE_PROVIDER_SITE_OTHER): Payer: Medicare Other | Admitting: Allergy and Immunology

## 2017-05-05 ENCOUNTER — Encounter: Payer: Self-pay | Admitting: Allergy and Immunology

## 2017-05-05 VITALS — BP 130/89 | HR 69 | Resp 19

## 2017-05-05 DIAGNOSIS — K219 Gastro-esophageal reflux disease without esophagitis: Secondary | ICD-10-CM

## 2017-05-05 DIAGNOSIS — J4540 Moderate persistent asthma, uncomplicated: Secondary | ICD-10-CM

## 2017-05-05 DIAGNOSIS — J3089 Other allergic rhinitis: Secondary | ICD-10-CM | POA: Diagnosis not present

## 2017-05-05 MED ORDER — BECLOMETHASONE DIPROPIONATE 80 MCG/ACT IN AERS: 2.0000 | INHALATION_SPRAY | RESPIRATORY_TRACT | 1 refills | Status: DC | PRN

## 2017-05-05 MED ORDER — FLUTICASONE-SALMETEROL 230-21 MCG/ACT IN AERO: 2.0000 | INHALATION_SPRAY | Freq: Two times a day (BID) | RESPIRATORY_TRACT | 2 refills | Status: DC

## 2017-05-05 MED ORDER — AZELASTINE-FLUTICASONE 137-50 MCG/ACT NA SUSP: NASAL | 2 refills | Status: DC

## 2017-05-05 NOTE — Patient Instructions (Addendum)
  1. Consistently use Advair 230 two inhalations two times per day.  2. Add Qvar 80 two inhalations two times per day to Advair during 'asthma flare up'  3. Continue Aciphex 20mg  one tablet one or two times per day depending on reflux activity  4. Continue Dymista one spray each nostril 1-2 times per day  5. Use ProAir HFA if needed.  6. Obtain fall flu vaccine  7. Return in December 2018 or earlier if problem

## 2017-05-05 NOTE — Progress Notes (Signed)
Follow-up Note  Referring Provider: Lavonia Dana, MD Primary Provider: Lavonia Dana, MD Date of Office Visit: 05/05/2017  Subjective:   Caitlin Tanner (DOB: 1950-12-30) is a 66 y.o. female who returns to the [facility] [place] on 05/05/2017 in re-evaluation of the following:  HPI: Caitlin Tanner returns to this clinic in reevaluation of her asthma and allergic rhinitis and LPR. Her last visit to this clinic was January 2018.  She really did relatively well and did not require a systemic steroid or antibiotics since I have seen her in this clinic to treat any type of respiratory tract issue. She is not that consistent about using her medications directed against respiratory tract inflammation. Over the course of the past month she ran out of her Advair and over the course of the past week or so she redeveloped problems with coughing and wheezing and fortunately reacquired her Advair the past week and restarted this medication and is doing better. She never added in her Qvar as part of her action plan for an asthma flareup. Usually she does not use any type of short acting bronchodilator but did so when she developed this most recent flareup. There was no obvious trigger for this flareup. She may of had some nasal congestion with this issue but no high fever or ugly nasal discharge or chest pain or ugly sputum production.  Her reflux is going quite well at this point in time while using AcipHex usually one time per day. She drinks decaf coffee and occasionally has tea twice a week and has been attempting to minimize Chocolate consumption.  Allergies as of 05/05/2017      Reactions   Conj Estrog-medroxyprogest Ace    REACTION: UNKNOWN REACTION   Ketorolac Tromethamine    REACTION: UNKNOWN REACTION   Tramadol Hcl    REACTION: UNKNOWN REACTION      Medication List      albuterol 108 (90 Base) MCG/ACT inhaler Commonly known as:  PROAIR HFA Use 2 puffs every 4 hours as needed for  cough or wheeze.  May use 2 puffs 10-20 minutes prior to exercise.   aspirin 81 MG tablet Take 81 mg by mouth daily.   Azelastine-Fluticasone 137-50 MCG/ACT Susp Commonly known as:  DYMISTA Use one spray in each nostril one to two times a day as directed.   AZO CRANBERRY PO Take by mouth as needed.   beclomethasone 80 MCG/ACT inhaler Commonly known as:  QVAR Inhale 2 puffs into the lungs as needed. Reported on 04/08/2016   escitalopram 5 MG tablet Commonly known as:  LEXAPRO Take 5 mg by mouth daily.   Fish Oil 1000 MG Caps Take by mouth daily.   fluticasone-salmeterol 230-21 MCG/ACT inhaler Commonly known as:  ADVAIR HFA Inhale 2 puffs into the lungs 2 (two) times daily.   loratadine 10 MG tablet Commonly known as:  CLARITIN Take 10 mg by mouth daily.   losartan-hydrochlorothiazide 50-12.5 MG tablet Commonly known as:  HYZAAR Take 1 tablet by mouth daily.   metoprolol succinate 50 MG 24 hr tablet Commonly known as:  TOPROL-XL Take 50 mg by mouth daily.   multivitamin per tablet Take 1 tablet by mouth daily.   NASAL SALINE NA Place into the nose as needed.   RABEprazole 20 MG tablet Commonly known as:  ACIPHEX Take one tablet before breakfast daily. May take additional dose before evening meal if needed.   Vitamin D3 2000 units Chew Chew 1 tablet by mouth  daily.   ZETIA 10 MG tablet Generic drug:  ezetimibe Take 10 mg by mouth daily.       Past Medical History:  Diagnosis Date  . Anxiety disorder   . Asthma   . Diverticulosis   . GERD (gastroesophageal reflux disease)   . Hemorrhoids   . Hypertension   . IBS (irritable bowel syndrome)   . S/P colonoscopy 12/23/10   Dr Rourk-diverticulosis, anal canal hemorhhoids, anal papilla, random bx benign  . S/P endoscopy 12/23/10   Dr Girard Cooter, tubular esophagus, sm HH, minimal retained food, bile-stained mucus, otherwise normal,, esophageal bx  benign    Past Surgical History:  Procedure Laterality Date   . CHOLECYSTECTOMY    . cold knife conization    . COLONOSCOPY  06/08/2006   internal hemorrhoids otherwise noraml colon and rectum  . COLONOSCOPY  11/2010   Dr. Gala Romney: Anal canal hemorrhoids, diverticulosis, random colon biopsies unremarkable.  . egd/tcs  11/2010   see PMH  . ESOPHAGOGASTRODUODENOSCOPY  06/08/2006   single distal erosion otherwise normal  . ESOPHAGOGASTRODUODENOSCOPY  11/2010   Dr. Gala Romney: Longitudinal for reading of the tubular esophagus, no evidence of eosinophilic esophagitis on biopsy, small hiatal hernia, minimal amount of retained food, bile-stained mucosa.  . left great toe surgery    . TONSILLECTOMY    . WRIST FRACTURE SURGERY  07/2013   right    Review of systems negative except as noted in HPI / PMHx or noted below:  Review of Systems  Constitutional: Negative.   HENT: Negative.   Eyes: Negative.   Respiratory: Negative.   Cardiovascular: Negative.   Gastrointestinal: Negative.   Genitourinary: Negative.   Musculoskeletal: Negative.   Skin: Negative.   Neurological: Negative.   Endo/Heme/Allergies: Negative.   Psychiatric/Behavioral: Negative.      Objective:   Vitals:   05/05/17 1019  BP: 130/89  Pulse: 69  Resp: 19          Physical Exam  Constitutional: She is well-developed, well-nourished, and in no distress.  HENT:  Head: Normocephalic.  Right Ear: Tympanic membrane, external ear and ear canal normal.  Left Ear: Tympanic membrane, external ear and ear canal normal.  Nose: Nose normal. No mucosal edema or rhinorrhea.  Mouth/Throat: Uvula is midline, oropharynx is clear and moist and mucous membranes are normal. No oropharyngeal exudate.  Eyes: Conjunctivae are normal.  Neck: Trachea normal. No tracheal tenderness present. No tracheal deviation present. No thyromegaly present.  Cardiovascular: Normal rate, regular rhythm, S1 normal, S2 normal and normal heart sounds.   No murmur heard. Pulmonary/Chest: Breath sounds normal. No  stridor. No respiratory distress. She has no wheezes. She has no rales.  Musculoskeletal: She exhibits no edema.  Lymphadenopathy:       Head (right side): No tonsillar adenopathy present.       Head (left side): No tonsillar adenopathy present.    She has no cervical adenopathy.  Neurological: She is alert. Gait normal.  Skin: No rash noted. She is not diaphoretic. No erythema. Nails show no clubbing.  Psychiatric: Mood and affect normal.    Diagnostics:    Spirometry was performed and demonstrated an FEV1 of 1.37 at 57 % of predicted.  The patient had an Asthma Control Test with the following results: ACT Total Score: 23.    Assessment and Plan:   1. Asthma, moderate persistent, well-controlled   2. Other allergic rhinitis   3. LPRD (laryngopharyngeal reflux disease)     1. Consistently use Advair 230  two inhalations two times per day.  2. Add Qvar 80 two inhalations two times per day to Advair during 'asthma flare up'  3. Continue Aciphex 20mg  one tablet one or two times per day depending on reflux activity  4. Continue Dymista one spray each nostril 1-2 times per day  5. Use ProAir HFA if needed.  6. Obtain fall flu vaccine  7. Return in December 2018 or earlier if problem    I believe that if Pollyanna will use her medications consistently she would do so much better and would not have intermittent flareups of her inflammatory respiratory disease. However, she always finds it difficult to utilize his medications on a regular basis. I have given her some samples today to help her along with utilizing these medications consistently. If she does well I will see her back in this clinic in December 2018 or earlier if there is a problem.  Allena Katz, MD [facility] [place]

## 2017-05-08 ENCOUNTER — Telehealth: Payer: Self-pay

## 2017-05-08 NOTE — Telephone Encounter (Signed)
Pharmacy called and wanted the  Instruction on how the patient is to use her Qvar. I gave them instruction on how the medication is to be used.

## 2017-08-24 ENCOUNTER — Other Ambulatory Visit: Payer: Self-pay

## 2017-08-26 MED ORDER — RABEPRAZOLE SODIUM 20 MG PO TBEC: DELAYED_RELEASE_TABLET | ORAL | 0 refills | Status: DC

## 2017-08-26 NOTE — Telephone Encounter (Signed)
Needs ov prior to further refills. Refilled X 1 only. Last seen in 2016.

## 2017-08-27 NOTE — Telephone Encounter (Signed)
LMOM, pt will need an appointment prior to getting additional refills.

## 2017-08-31 NOTE — Telephone Encounter (Signed)
Please schedule ov.  

## 2017-08-31 NOTE — Telephone Encounter (Signed)
LMOM, for a return call.

## 2017-09-01 ENCOUNTER — Telehealth: Payer: Self-pay | Admitting: Internal Medicine

## 2017-09-01 NOTE — Telephone Encounter (Signed)
Pt said that she was returning a call to AM. Please call her back at [phone]

## 2017-09-01 NOTE — Telephone Encounter (Signed)
Pt notified that her aciphex was called into pharmacy but pt will need an appointment for additional refills. Pt is scheduling f/u as directed.

## 2017-09-01 NOTE — Telephone Encounter (Signed)
PATIENT SCHEDULED  °

## 2017-10-21 ENCOUNTER — Telehealth: Payer: Self-pay

## 2017-10-21 MED ORDER — RABEPRAZOLE SODIUM 20 MG PO TBEC: DELAYED_RELEASE_TABLET | ORAL | 0 refills | Status: DC

## 2017-10-21 NOTE — Telephone Encounter (Signed)
Done

## 2017-10-21 NOTE — Telephone Encounter (Signed)
[facility] requesting refills for pt for Aciphex.  She was told last refill, she had to have appt before more refills. Pt has appt oon 10/26/2017 with Roseanne Kaufman, NP.

## 2017-10-26 ENCOUNTER — Encounter: Payer: Self-pay | Admitting: Gastroenterology

## 2017-10-26 ENCOUNTER — Ambulatory Visit (INDEPENDENT_AMBULATORY_CARE_PROVIDER_SITE_OTHER): Payer: Medicare Other | Admitting: Gastroenterology

## 2017-10-26 VITALS — BP 135/81 | HR 67 | Temp 97.0°F | Ht 67.0 in | Wt 182.4 lb

## 2017-10-26 DIAGNOSIS — K219 Gastro-esophageal reflux disease without esophagitis: Secondary | ICD-10-CM

## 2017-10-26 MED ORDER — RABEPRAZOLE SODIUM 20 MG PO TBEC: DELAYED_RELEASE_TABLET | ORAL | 3 refills | Status: DC

## 2017-10-26 NOTE — Progress Notes (Signed)
Referring Provider: Lavonia Dana, MD Primary Care Physician:  Lavonia Dana, MD Primary GI: Dr. Gala Romney   Chief Complaint  Patient presents with  . Gastroesophageal Reflux    occ, needs Rabeprazole refill  . Irritable Bowel Syndrome    HPI:   Caitlin Tanner is a 67 y.o. female presenting today with a history of GERD,  IBS. Last seen in June 2016. Historically on Aciphex.   No dysphagia. GERD well-controlled. Rare abdominal discomfort. IBS at baseline. No rectal bleeding. Last colonoscopy in 2010/12/31.   Her mother passed away unexpectedly right before Christmas, so it has been a hard season for her.   Past Medical History:  Diagnosis Date  . Anxiety disorder   . Asthma   . Diverticulosis   . GERD (gastroesophageal reflux disease)   . Hemorrhoids   . Hypertension   . IBS (irritable bowel syndrome)   . S/P colonoscopy 12/23/10   Dr Rourk-diverticulosis, anal canal hemorhhoids, anal papilla, random bx benign  . S/P endoscopy 12/23/10   Dr Girard Cooter, tubular esophagus, sm HH, minimal retained food, bile-stained mucus, otherwise normal,, esophageal bx  benign    Past Surgical History:  Procedure Laterality Date  . CHOLECYSTECTOMY    . cold knife conization    . COLONOSCOPY  06/08/2006   internal hemorrhoids otherwise noraml colon and rectum  . COLONOSCOPY  12-31-10   Dr. Gala Romney: Anal canal hemorrhoids, diverticulosis, random colon biopsies unremarkable.  . egd/tcs  Dec 31, 2010   see PMH  . ESOPHAGOGASTRODUODENOSCOPY  06/08/2006   single distal erosion otherwise normal  . ESOPHAGOGASTRODUODENOSCOPY  2010/12/31   Dr. Gala Romney: Longitudinal for reading of the tubular esophagus, no evidence of eosinophilic esophagitis on biopsy, small hiatal hernia, minimal amount of retained food, bile-stained mucosa.  . left great toe surgery    . TONSILLECTOMY    . WRIST FRACTURE SURGERY  07/2013   right    Current Outpatient Medications  Medication Sig Dispense Refill  . albuterol (PROAIR HFA)  108 (90 Base) MCG/ACT inhaler Use 2 puffs every 4 hours as needed for cough or wheeze.  May use 2 puffs 10-20 minutes prior to exercise. 1 Inhaler 1  . Ascorbic Acid (VITAMIN C GUMMIE PO) Take by mouth as needed.    Marland Kitchen aspirin 81 MG tablet Take 81 mg by mouth daily.      . Azelastine-Fluticasone (DYMISTA) 137-50 MCG/ACT SUSP Use one spray in each nostril one to two times a day as directed. (Patient taking differently: as needed. Use one spray in each nostril one to two times a day as directed.) 1 Bottle 2  . bismuth subsalicylate (PEPTO BISMOL) 262 MG/15ML suspension Take 30 mLs by mouth as needed.    . calcium carbonate (TUMS - DOSED IN MG ELEMENTAL CALCIUM) 500 MG chewable tablet Chew 1 tablet by mouth as needed for indigestion or heartburn.    . Cholecalciferol (VITAMIN D3) 2000 UNITS CHEW Chew 1 tablet by mouth daily.    . Cranberry-Vitamin C-Probiotic (AZO CRANBERRY PO) Take 2 tablets by mouth daily.     Marland Kitchen escitalopram (LEXAPRO) 5 MG tablet Take 5 mg by mouth daily.    Marland Kitchen ezetimibe (ZETIA) 10 MG tablet Take 10 mg by mouth daily.      . fluticasone-salmeterol (ADVAIR HFA) 230-21 MCG/ACT inhaler Inhale 2 puffs into the lungs 2 (two) times daily. 3 Inhaler 2  . loratadine (CLARITIN) 10 MG tablet Take 10 mg by mouth daily as needed.     Marland Kitchen LORazepam (  ATIVAN) 0.5 MG tablet Take 0.5 mg by mouth as needed for anxiety.    Marland Kitchen losartan-hydrochlorothiazide (HYZAAR) 50-12.5 MG per tablet Take 1 tablet by mouth daily.      . metoprolol (TOPROL-XL) 50 MG 24 hr tablet Take 50 mg by mouth daily.      . multivitamin (THERAGRAN) per tablet Take 1 tablet by mouth daily.      . naproxen sodium (ALEVE) 220 MG tablet Take 220 mg by mouth as needed.    . Omega-3 Fatty Acids (FISH OIL) 1000 MG CAPS Take by mouth daily.      . RABEprazole (ACIPHEX) 20 MG tablet Take one tablet before breakfast daily. May take additional dose before evening meal if needed. 60 tablet 0  . beclomethasone (QVAR) 80 MCG/ACT inhaler Inhale 2  puffs into the lungs as needed. Reported on 04/08/2016 (Patient not taking: Reported on 10/26/2017) 1 Inhaler 1  . NASAL SALINE NA Place into the nose as needed.     No current facility-administered medications for this visit.     Allergies as of 10/26/2017 - Review Complete 10/26/2017  Allergen Reaction Noted  . Conj estrog-medroxyprogest ace    . Ketorolac tromethamine    . Tramadol hcl      Family History  Problem Relation Age of Onset  . Stomach cancer Father 13  . Throat cancer Mother 28  . Diabetes Brother   . Transient ischemic attack Brother     Social History   Socioeconomic History  . Marital status: Married    Spouse name: None  . Number of children: 1  . Years of education: None  . Highest education level: None  Social Needs  . Financial resource strain: None  . Food insecurity - worry: None  . Food insecurity - inability: None  . Transportation needs - medical: None  . Transportation needs - non-medical: None  Occupational History    Employer: UNEMPLOYED  Tobacco Use  . Smoking status: Former Smoker    Packs/day: 0.10    Years: 3.00    Pack years: 0.30    Types: Cigarettes    Last attempt to quit: 09/30/1987    Years since quitting: 30.0  . Smokeless tobacco: Never Used  . Tobacco comment: pt states she smoked socially  Substance and Sexual Activity  . Alcohol use: No  . Drug use: No  . Sexual activity: None  Other Topics Concern  . None  Social History Narrative  . None    Review of Systems: Gen: Denies fever, chills, anorexia. Denies fatigue, weakness, weight loss.  CV: Denies chest pain, palpitations, syncope, peripheral edema, and claudication. Resp: Denies dyspnea at rest, cough, wheezing, coughing up blood, and pleurisy. GI: see HPI  Derm: Denies rash, itching, dry skin Psych: Denies depression, anxiety, memory loss, confusion. No homicidal or suicidal ideation.  Heme: Denies bruising, bleeding, and enlarged lymph nodes.  Physical  Exam: BP 135/81   Pulse 67   Temp (!) 97 F (36.1 C) (Oral)   Ht 5\' 7"  (1.702 m)   Wt 182 lb 6.4 oz (82.7 kg)   BMI 28.57 kg/m  General:   Alert and oriented. No distress noted. Pleasant and cooperative.  Head:  Normocephalic and atraumatic. Eyes:  Conjuctiva clear without scleral icterus. Mouth:  Oral mucosa pink and moist.  Abdomen:  +BS, soft, non-tender and non-distended. No rebound or guarding. No HSM or masses noted. Msk:  Symmetrical without gross deformities. Normal posture. Extremities:  Without edema. Neurologic:  Alert  and  oriented x4 Psych:  Alert and cooperative. Normal mood and affect.

## 2017-10-26 NOTE — Patient Instructions (Signed)
I have refilled Aciphex for you.   Please complete the stool sample when you are able. If it shows blood, we recommend a colonoscopy.  We will see you in 1 year! I am so sorry for your loss. We will be thinking of you.  It was a pleasure to see you today. I strive to create trusting relationships with patients to provide genuine, compassionate, and quality care. I value your feedback. If you receive a survey regarding your visit,  I greatly appreciate you the taking time to fill this out.   Annitta Needs, PhD, ANP-BC [facility]

## 2017-10-28 ENCOUNTER — Telehealth: Payer: Self-pay

## 2017-10-28 NOTE — Telephone Encounter (Signed)
Received a fax from [facility] asking for rx changes of Aciphex. Form was filled out and faxed to [facility] [phone]

## 2017-10-28 NOTE — Assessment & Plan Note (Signed)
Doing well with Aciphex once daily. No alarm symptoms. Refilled for one year, and we will see her back in the office in 1 year.  Last colonoscopy 2012. Has not had a recent ifobt. No concerning lower GI symptoms. If negative, keep plans for colonoscopy in 2022. If positive, pursue colonoscopy. Discussed with patient.

## 2017-10-29 NOTE — Progress Notes (Signed)
cc'ed to pcp °

## 2017-11-03 ENCOUNTER — Ambulatory Visit (INDEPENDENT_AMBULATORY_CARE_PROVIDER_SITE_OTHER): Payer: Medicare Other | Admitting: Gastroenterology

## 2017-11-03 ENCOUNTER — Encounter: Payer: Self-pay | Admitting: Gastroenterology

## 2017-11-03 DIAGNOSIS — Z1211 Encounter for screening for malignant neoplasm of colon: Secondary | ICD-10-CM | POA: Diagnosis not present

## 2017-11-03 LAB — IFOBT (OCCULT BLOOD): IFOBT: NEGATIVE

## 2017-11-04 ENCOUNTER — Encounter: Payer: Self-pay | Admitting: Allergy and Immunology

## 2017-11-04 ENCOUNTER — Ambulatory Visit (INDEPENDENT_AMBULATORY_CARE_PROVIDER_SITE_OTHER): Payer: Medicare Other | Admitting: Allergy and Immunology

## 2017-11-04 VITALS — BP 140/84 | HR 72 | Resp 16

## 2017-11-04 DIAGNOSIS — K219 Gastro-esophageal reflux disease without esophagitis: Secondary | ICD-10-CM

## 2017-11-04 DIAGNOSIS — J4540 Moderate persistent asthma, uncomplicated: Secondary | ICD-10-CM

## 2017-11-04 DIAGNOSIS — J3089 Other allergic rhinitis: Secondary | ICD-10-CM

## 2017-11-04 MED ORDER — METHYLPREDNISOLONE ACETATE 80 MG/ML IJ SUSP
80.0000 mg | Freq: Once | INTRAMUSCULAR | Status: AC
  Administered 2017-11-04: 80 mg via INTRAMUSCULAR

## 2017-11-04 NOTE — Progress Notes (Signed)
Follow-up Note  Referring Provider: Lavonia Dana, MD Primary Provider: Lavonia Dana, MD Date of Office Visit: 11/04/2017  Subjective:   Caitlin Tanner (DOB: 07-Sep-1951) is a 67 y.o. female who returns to the [facility] on 11/04/2017 in re-evaluation of the following:  HPI: Caitlin Tanner presents to this clinic in reevaluation of her asthma and allergic rhinitis and LPR.  Her last visit to this clinic was 05 May 2017.  She was doing quite well with her airway issue without any significant breathing problems and no need to use a short acting bronchodilator and no requirement for a systemic steroid or an antibiotic in a prolonged period in time.    However, at the very beginning of January she was diagnosed with "sinusitis" and ear infection and urinary tract infection and she was given levofloxacin.  Apparently at that point in time the whole family had a respiratory tract infection.  As well, she has noticed that over the course of the past month or so she has had more nasal congestion and nose blowing and some congestion in her chest.  This appears to correlate with large amounts of dust exposure as she is rearranging her mom's house.  Her mom passed away at the tail end of December from an aortic dissection.  Her reflux is under good control at this point in time using AcipHex once a day but occasionally twice a day.  Concerning her recent mom's passing, apparently she had a acute dissection of her aorta while in church and there was a rather traumatic experience that occurred at church with that event.  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  Allergies as of 11/04/2017      Reactions   Conj Estrog-medroxyprogest Ace    REACTION: UNKNOWN REACTION   Ketorolac Tromethamine    REACTION: UNKNOWN REACTION   Tramadol Hcl    REACTION: UNKNOWN REACTION      Medication List      albuterol 108 (90 Base) MCG/ACT inhaler Commonly known as:  PROAIR  HFA Use 2 puffs every 4 hours as needed for cough or wheeze.  May use 2 puffs 10-20 minutes prior to exercise.   aspirin 81 MG tablet Take 81 mg by mouth daily.   Azelastine-Fluticasone 137-50 MCG/ACT Susp Commonly known as:  DYMISTA Use one spray in each nostril one to two times a day as directed.   AZO CRANBERRY PO Take 2 tablets by mouth daily.   bismuth subsalicylate 166 AY/30ZS suspension Commonly known as:  PEPTO BISMOL Take 30 mLs by mouth as needed.   calcium carbonate 500 MG chewable tablet Commonly known as:  TUMS - dosed in mg elemental calcium Chew 1 tablet by mouth as needed for indigestion or heartburn.   escitalopram 5 MG tablet Commonly known as:  LEXAPRO Take 5 mg by mouth daily.   Fish Oil 1000 MG Caps Take by mouth daily.   fluticasone-salmeterol 230-21 MCG/ACT inhaler Commonly known as:  ADVAIR HFA Inhale 2 puffs into the lungs 2 (two) times daily.   loratadine 10 MG tablet Commonly known as:  CLARITIN Take 10 mg by mouth daily as needed.   LORazepam 0.5 MG tablet Commonly known as:  ATIVAN Take 0.5 mg by mouth as needed for anxiety.   losartan-hydrochlorothiazide 50-12.5 MG tablet Commonly known as:  HYZAAR Take 1 tablet by mouth daily.   metoprolol succinate 50 MG 24 hr tablet Commonly known as:  TOPROL-XL Take 50 mg  by mouth daily.   multivitamin per tablet Take 1 tablet by mouth daily.   naproxen sodium 220 MG tablet Commonly known as:  ALEVE Take 220 mg by mouth as needed.   RABEprazole 20 MG tablet Commonly known as:  ACIPHEX Take one tablet before breakfast daily. May take additional dose before evening meal if needed.   VITAMIN C GUMMIE PO Take by mouth as needed.   Vitamin D3 2000 units Chew Chew 1 tablet by mouth daily.   ZETIA 10 MG tablet Generic drug:  ezetimibe Take 10 mg by mouth daily.       Past Medical History:  Diagnosis Date  . Anxiety disorder   . Asthma   . Diverticulosis   . GERD (gastroesophageal  reflux disease)   . Hemorrhoids   . Hypertension   . IBS (irritable bowel syndrome)   . S/P colonoscopy 12/23/10   Dr Rourk-diverticulosis, anal canal hemorhhoids, anal papilla, random bx benign  . S/P endoscopy 12/23/10   Dr Girard Cooter, tubular esophagus, sm HH, minimal retained food, bile-stained mucus, otherwise normal,, esophageal bx  benign    Past Surgical History:  Procedure Laterality Date  . CHOLECYSTECTOMY    . cold knife conization    . COLONOSCOPY  06/08/2006   internal hemorrhoids otherwise noraml colon and rectum  . COLONOSCOPY  11/2010   Dr. Gala Romney: Anal canal hemorrhoids, diverticulosis, random colon biopsies unremarkable.  . egd/tcs  11/2010   see PMH  . ESOPHAGOGASTRODUODENOSCOPY  06/08/2006   single distal erosion otherwise normal  . ESOPHAGOGASTRODUODENOSCOPY  11/2010   Dr. Gala Romney: Longitudinal for reading of the tubular esophagus, no evidence of eosinophilic esophagitis on biopsy, small hiatal hernia, minimal amount of retained food, bile-stained mucosa.  . left great toe surgery    . TONSILLECTOMY    . WRIST FRACTURE SURGERY  07/2013   right    Review of systems negative except as noted in HPI / PMHx or noted below:  Review of Systems  Constitutional: Negative.   HENT: Negative.   Eyes: Negative.   Respiratory: Negative.   Cardiovascular: Negative.   Gastrointestinal: Negative.   Genitourinary: Negative.   Musculoskeletal: Negative.   Skin: Negative.   Neurological: Negative.   Endo/Heme/Allergies: Negative.   Psychiatric/Behavioral: Negative.      Objective:   Vitals:   11/04/17 1105  BP: 140/84  Pulse: 72  Resp: 16          Physical Exam  Constitutional: She is well-developed, well-nourished, and in no distress.  HENT:  Head: Normocephalic.  Right Ear: Tympanic membrane, external ear and ear canal normal.  Left Ear: Tympanic membrane, external ear and ear canal normal.  Nose: Nose normal. No mucosal edema or rhinorrhea.  Mouth/Throat:  Uvula is midline, oropharynx is clear and moist and mucous membranes are normal. No oropharyngeal exudate.  Eyes: Conjunctivae are normal.  Neck: Trachea normal. No tracheal tenderness present. No tracheal deviation present. No thyromegaly present.  Cardiovascular: Normal rate, regular rhythm, S1 normal, S2 normal and normal heart sounds.  No murmur heard. Pulmonary/Chest: Breath sounds normal. No stridor. No respiratory distress. She has no wheezes. She has no rales.  Musculoskeletal: She exhibits no edema.  Lymphadenopathy:       Head (right side): No tonsillar adenopathy present.       Head (left side): No tonsillar adenopathy present.    She has no cervical adenopathy.  Neurological: She is alert. Gait normal.  Skin: No rash noted. She is not diaphoretic. No erythema. Nails  show no clubbing.  Psychiatric: Mood and affect normal.    Diagnostics:    Spirometry was performed and demonstrated an FEV1 of 1.35 at 52 % of predicted.  The patient had an Asthma Control Test with the following results: ACT Total Score: 19.    Assessment and Plan:   1. Asthma, moderate persistent, well-controlled   2. Other allergic rhinitis   3. LPRD (laryngopharyngeal reflux disease)     1. Continue Advair 230 two inhalations two times per day.  2. Add Qvar 80 Redihaler two inhalations two times per day to Advair during 'asthma flare up'  3. Continue Aciphex 20mg  one tablet one or two times per day depending on reflux activity  4. Continue Dymista one spray each nostril 1-2 times per day  5. Use ProAir HFA if needed.  6.  Depo-Medrol 80 IM delivered in clinic today  7. Return in 6 months or earlier if problem    Addis appears to have some inflammation of her airway which is probably on the basis of her dust exposure as she is a rearranging her mom's house and I have given her a systemic steroid to help with the inflammation in her airway while she continues to use anti-inflammatory medications in  the form of Advair and Dymista on a regular basis and also continues to treat reflux. I will assume she will do well with this therapy and see her back in this clinic in 6 months or earlier if there is a problem.  I also had a talk with her today about her mom's passing and her experience with PTSD as a result of the event that occurred at church and we discussed some techniques that she could use to help with this condition.  Allena Katz, MD [facility] [place]

## 2017-11-04 NOTE — Progress Notes (Signed)
Heme negative. Unless clinically changes, next colonoscopy 2022.

## 2017-11-04 NOTE — Patient Instructions (Addendum)
  1. Continue Advair 230 two inhalations two times per day.  2. Add Qvar 80 Redihaler two inhalations two times per day to Advair during 'asthma flare up'  3. Continue Aciphex 20mg  one tablet one or two times per day depending on reflux activity  4. Continue Dymista one spray each nostril 1-2 times per day  5. Use ProAir HFA if needed.  6.  Depo-Medrol 80 IM delivered in clinic today  7. Return in 6 months or earlier if problem

## 2017-11-04 NOTE — Progress Notes (Signed)
Ifobt is negative. Unless anything changes, keep plans for colonoscopy 2022.

## 2017-11-05 ENCOUNTER — Encounter: Payer: Self-pay | Admitting: Allergy and Immunology

## 2017-11-17 NOTE — Telephone Encounter (Signed)
Received an approval letter for Rabeprazole Sodium Dr Tab 08/19/17-11/17/18. Pt was notified. Letter will be scanned in chart.

## 2018-05-03 ENCOUNTER — Emergency Department (HOSPITAL_COMMUNITY): Payer: Medicare Other

## 2018-05-03 ENCOUNTER — Emergency Department (HOSPITAL_COMMUNITY)
Admission: EM | Admit: 2018-05-03 | Discharge: 2018-05-03 | Disposition: A | Discharge status: Home / Self Care | Payer: Medicare Other | Attending: Emergency Medicine | Admitting: Emergency Medicine

## 2018-05-03 ENCOUNTER — Encounter (HOSPITAL_COMMUNITY): Payer: Self-pay | Admitting: Emergency Medicine

## 2018-05-03 ENCOUNTER — Other Ambulatory Visit: Payer: Self-pay

## 2018-05-03 DIAGNOSIS — R110 Nausea: Secondary | ICD-10-CM | POA: Diagnosis present

## 2018-05-03 DIAGNOSIS — Z79899 Other long term (current) drug therapy: Secondary | ICD-10-CM | POA: Diagnosis not present

## 2018-05-03 DIAGNOSIS — R05 Cough: Secondary | ICD-10-CM | POA: Diagnosis not present

## 2018-05-03 DIAGNOSIS — I10 Essential (primary) hypertension: Secondary | ICD-10-CM | POA: Insufficient documentation

## 2018-05-03 DIAGNOSIS — J45909 Unspecified asthma, uncomplicated: Secondary | ICD-10-CM | POA: Diagnosis not present

## 2018-05-03 DIAGNOSIS — Z87891 Personal history of nicotine dependence: Secondary | ICD-10-CM | POA: Insufficient documentation

## 2018-05-03 DIAGNOSIS — R059 Cough, unspecified: Secondary | ICD-10-CM

## 2018-05-03 LAB — CBC WITH DIFFERENTIAL/PLATELET
Basophils Absolute: 0 10*3/uL (ref 0.0–0.1)
Basophils Relative: 0 %
Eosinophils Absolute: 0.1 10*3/uL (ref 0.0–0.7)
Eosinophils Relative: 1 %
HCT: 42.1 % (ref 36.0–46.0)
Hemoglobin: 14.3 g/dL (ref 12.0–15.0)
Lymphocytes Relative: 23 %
Lymphs Abs: 1.8 10*3/uL (ref 0.7–4.0)
MCH: 31.1 pg (ref 26.0–34.0)
MCHC: 34 g/dL (ref 30.0–36.0)
MCV: 91.5 fL (ref 78.0–100.0)
Monocytes Absolute: 0.9 10*3/uL (ref 0.1–1.0)
Monocytes Relative: 12 %
Neutro Abs: 4.8 10*3/uL (ref 1.7–7.7)
Neutrophils Relative %: 64 %
Platelets: 196 10*3/uL (ref 150–400)
RBC: 4.6 MIL/uL (ref 3.87–5.11)
RDW: 12.6 % (ref 11.5–15.5)
WBC: 7.6 10*3/uL (ref 4.0–10.5)

## 2018-05-03 LAB — BASIC METABOLIC PANEL
Anion gap: 9 (ref 5–15)
BUN: 17 mg/dL (ref 8–23)
CO2: 28 mmol/L (ref 22–32)
Calcium: 9.6 mg/dL (ref 8.9–10.3)
Chloride: 99 mmol/L (ref 98–111)
Creatinine, Ser: 0.99 mg/dL (ref 0.44–1.00)
GFR calc Af Amer: >60 mL/min (ref >60)
GFR calc non Af Amer: 57 mL/min — ABNORMAL LOW (ref >60)
Glucose, Bld: 130 mg/dL — ABNORMAL HIGH (ref 70–99)
Potassium: 3.9 mmol/L (ref 3.5–5.1)
Sodium: 136 mmol/L (ref 135–145)

## 2018-05-03 MED ORDER — ONDANSETRON 4 MG PO TBDP: ORAL_TABLET | ORAL | 0 refills | Status: DC

## 2018-05-03 MED ORDER — AZITHROMYCIN 250 MG PO TABS: ORAL_TABLET | ORAL | 0 refills | Status: DC

## 2018-05-03 NOTE — Discharge Instructions (Addendum)
Follow-up with your family doctor next week if not improving °

## 2018-05-03 NOTE — ED Provider Notes (Signed)
[facility] EMERGENCY DEPARTMENT Provider Note   CSN: [id number] Arrival date & time: 05/03/18  1318     History   Chief Complaint Chief Complaint  Patient presents with  . Nausea  . Cough    HPI Caitlin Tanner is a 67 y.o. female.  Patient complains of a cough for over a week.  She also says she has yellow-green sputum production and has felt weak  The history is provided by the patient. No language interpreter was used.  Cough  This is a recurrent problem. The current episode started more than 1 week ago. The problem occurs constantly. The problem has not changed since onset.The cough is productive of sputum. There has been no fever. Pertinent negatives include no chest pain and no headaches. She has tried nothing for the symptoms. The treatment provided no relief.    Past Medical History:  Diagnosis Date  . Anxiety disorder   . Asthma   . Diverticulosis   . GERD (gastroesophageal reflux disease)   . Hemorrhoids   . Hypertension   . IBS (irritable bowel syndrome)   . S/P colonoscopy 12/23/10   Dr Rourk-diverticulosis, anal canal hemorhhoids, anal papilla, random bx benign  . S/P endoscopy 12/23/10   Dr Girard Cooter, tubular esophagus, sm HH, minimal retained food, bile-stained mucus, otherwise normal,, esophageal bx  benign    Patient Active Problem List   Diagnosis Date Noted  . Nausea alone 07/08/2013  . Need for prophylactic vaccination and inoculation against influenza 06/12/2013  . Well controlled persistent asthma 06/12/2013  . Multiple lung nodules 06/12/2013  . Abdominal bloating 11/24/2011  . Left lower quadrant pain 10/22/2011  . HEMORRHOIDS, INTERNAL 05/29/2009  . GERD 05/29/2009  . HEMATOCHEZIA 05/29/2009  . ANXIETY NEUROSIS 05/28/2009  . IRRITABLE BOWEL SYNDROME 05/28/2009  . DIARRHEA 05/28/2009  . ALLERGY 05/28/2009    Past Surgical History:  Procedure Laterality Date  . CHOLECYSTECTOMY    . cold knife conization    . COLONOSCOPY  06/08/2006   internal hemorrhoids otherwise noraml colon and rectum  . COLONOSCOPY  11/2010   Dr. Gala Romney: Anal canal hemorrhoids, diverticulosis, random colon biopsies unremarkable.  . egd/tcs  11/2010   see PMH  . ESOPHAGOGASTRODUODENOSCOPY  06/08/2006   single distal erosion otherwise normal  . ESOPHAGOGASTRODUODENOSCOPY  11/2010   Dr. Gala Romney: Longitudinal for reading of the tubular esophagus, no evidence of eosinophilic esophagitis on biopsy, small hiatal hernia, minimal amount of retained food, bile-stained mucosa.  . left great toe surgery    . TONSILLECTOMY    . WRIST FRACTURE SURGERY  07/2013   right     OB History   None      Home Medications    Prior to Admission medications   Medication Sig Start Date End Date Taking? Authorizing Provider  albuterol (PROAIR HFA) 108 (90 Base) MCG/ACT inhaler Use 2 puffs every 4 hours as needed for cough or wheeze.  May use 2 puffs 10-20 minutes prior to exercise. 10/16/15   Kozlow, Donnamarie Poag, MD  Ascorbic Acid (VITAMIN C GUMMIE PO) Take by mouth as needed.    [provider]  aspirin 81 MG tablet Take 81 mg by mouth daily.      [provider]  Azelastine-Fluticasone (DYMISTA) 137-50 MCG/ACT SUSP Use one spray in each nostril one to two times a day as directed. Patient taking differently: as needed. Use one spray in each nostril one to two times a day as directed. 05/05/17   Kozlow, Donnamarie Poag, MD  azithromycin (ZITHROMAX Z-PAK) 250 MG tablet 2 po day one, then 1 daily x 4 days 05/03/18   Milton Ferguson, MD  bismuth subsalicylate (PEPTO BISMOL) 262 MG/15ML suspension Take 30 mLs by mouth as needed.    [provider]  calcium carbonate (TUMS - DOSED IN MG ELEMENTAL CALCIUM) 500 MG chewable tablet Chew 1 tablet by mouth as needed for indigestion or heartburn.    [provider]  Cholecalciferol (VITAMIN D3) 2000 UNITS CHEW Chew 1 tablet by mouth daily.    [provider]  Cranberry-Vitamin C-Probiotic (AZO CRANBERRY PO) Take 2  tablets by mouth daily.     [provider]  escitalopram (LEXAPRO) 5 MG tablet Take 5 mg by mouth daily.    [provider]  ezetimibe (ZETIA) 10 MG tablet Take 10 mg by mouth daily.      [provider]  fluticasone-salmeterol (ADVAIR HFA) 230-21 MCG/ACT inhaler Inhale 2 puffs into the lungs 2 (two) times daily. 05/05/17   Kozlow, Donnamarie Poag, MD  loratadine (CLARITIN) 10 MG tablet Take 10 mg by mouth daily as needed.     [provider]  LORazepam (ATIVAN) 0.5 MG tablet Take 0.5 mg by mouth as needed for anxiety.    [provider]  losartan-hydrochlorothiazide (HYZAAR) 50-12.5 MG per tablet Take 1 tablet by mouth daily.      [provider]  metoprolol (TOPROL-XL) 50 MG 24 hr tablet Take 50 mg by mouth daily.      [provider]  multivitamin [facility]) per tablet Take 1 tablet by mouth daily.      [provider]  naproxen sodium (ALEVE) 220 MG tablet Take 220 mg by mouth as needed.    [provider]  Omega-3 Fatty Acids (FISH OIL) 1000 MG CAPS Take by mouth daily.      [provider]  RABEprazole (ACIPHEX) 20 MG tablet Take one tablet before breakfast daily. May take additional dose before evening meal if needed. 10/26/17   Annitta Needs, NP    Family History Family History  Problem Relation Age of Onset  . Stomach cancer Father 7  . Throat cancer Mother 10  . Diabetes Brother   . Transient ischemic attack Brother   . Colon cancer Neg Hx     Social History Social History   Tobacco Use  . Smoking status: Former Smoker    Packs/day: 0.10    Years: 3.00    Pack years: 0.30    Types: Cigarettes    Last attempt to quit: 09/30/1987    Years since quitting: 30.6  . Smokeless tobacco: Never Used  . Tobacco comment: pt states she smoked socially  Substance Use Topics  . Alcohol use: No  . Drug use: No     Allergies   Conj estrog-medroxyprogest ace; Ketorolac tromethamine; and Tramadol  hcl   Review of Systems Review of Systems  Constitutional: Negative for appetite change and fatigue.  HENT: Negative for congestion, ear discharge and sinus pressure.   Eyes: Negative for discharge.  Respiratory: Positive for cough.   Cardiovascular: Negative for chest pain.  Gastrointestinal: Negative for abdominal pain and diarrhea.  Genitourinary: Negative for frequency and hematuria.  Musculoskeletal: Negative for back pain.  Skin: Negative for rash.  Neurological: Negative for seizures and headaches.  Psychiatric/Behavioral: Negative for hallucinations.     Physical Exam Updated Vital Signs BP 123/80 (BP Location: Right Arm)   Pulse 73   Temp 97.8 F (36.6 C) (Oral)  Resp 17   Ht 5\' 7"  (1.702 m)   Wt 80.3 kg (177 lb)   SpO2 96%   BMI 27.72 kg/m   Physical Exam  Constitutional: She is oriented to person, place, and time. She appears well-developed.  HENT:  Head: Normocephalic.  Eyes: Conjunctivae and EOM are normal. No scleral icterus.  Neck: Neck supple. No thyromegaly present.  Cardiovascular: Normal rate and regular rhythm. Exam reveals no gallop and no friction rub.  No murmur heard. Pulmonary/Chest: No stridor. She has no wheezes. She has no rales. She exhibits no tenderness.  Abdominal: She exhibits no distension. There is no tenderness. There is no rebound.  Musculoskeletal: Normal range of motion. She exhibits no edema.  Lymphadenopathy:    She has no cervical adenopathy.  Neurological: She is oriented to person, place, and time. She exhibits normal muscle tone. Coordination normal.  Skin: No rash noted. No erythema.  Psychiatric: She has a normal mood and affect. Her behavior is normal.     ED Treatments / Results  Labs (all labs ordered are listed, but only abnormal results are displayed) Labs Reviewed  BASIC METABOLIC PANEL - Abnormal; Notable for the following components:      Result Value   Glucose, Bld 130 (*)    GFR calc non Af Amer 57 (*)     All other components within normal limits  CBC WITH DIFFERENTIAL/PLATELET    EKG None  Radiology Dg Chest 2 View  Result Date: 05/03/2018 CLINICAL DATA:  Cough with loss of appetite. Dizziness. Chest pressure at times. EXAM: CHEST - 2 VIEW COMPARISON:  03/11/2011. FINDINGS: Normal heart size. Calcified tortuous aorta. No consolidation or edema. Mild scarring at the LEFT base. No effusion or pneumothorax. Bones unremarkable. Similar appearance to priors. IMPRESSION: No acute cardiopulmonary disease. Electronically Signed   By: Staci Righter M.D.   On: 05/03/2018 16:03    Procedures Procedures (including critical care time)  Medications Ordered in ED Medications - No data to display   Initial Impression / Assessment and Plan / ED Course  I have reviewed the triage vital signs and the nursing notes.  Pertinent labs & imaging results that were available during my care of the patient were reviewed by me and considered in my medical decision making (see chart for details).      Patient with bronchitis for over a week.  She was placed on Z-Pak and follow-up with her PCP Final Clinical Impressions(s) / ED Diagnoses   Final diagnoses:  Cough    ED Discharge Orders        Ordered    azithromycin (ZITHROMAX Z-PAK) 250 MG tablet     05/03/18 1700       Milton Ferguson, MD 05/03/18 1703

## 2018-05-03 NOTE — ED Triage Notes (Addendum)
Pt cough/shaky all over and nausea. White/green phlegm prod cough x 1 week. Decreased appetite. Upper body shakes noted. C/o chronic lower back pain to left side. Pt seen last week at [facility], was put on nose spray and claritin and no better. Started lexapro last week as well but has been on it before. Nad.

## 2018-05-04 ENCOUNTER — Ambulatory Visit: Payer: Medicare Other | Admitting: Allergy and Immunology

## 2018-05-10 ENCOUNTER — Other Ambulatory Visit: Payer: Self-pay

## 2018-05-12 ENCOUNTER — Encounter: Payer: Self-pay | Admitting: Gastroenterology

## 2018-05-12 ENCOUNTER — Ambulatory Visit (INDEPENDENT_AMBULATORY_CARE_PROVIDER_SITE_OTHER): Payer: Medicare Other | Admitting: Gastroenterology

## 2018-05-12 VITALS — BP 129/83 | HR 72 | Temp 97.6°F | Ht 67.0 in | Wt 176.8 lb

## 2018-05-12 DIAGNOSIS — K219 Gastro-esophageal reflux disease without esophagitis: Secondary | ICD-10-CM

## 2018-05-12 DIAGNOSIS — R140 Abdominal distension (gaseous): Secondary | ICD-10-CM

## 2018-05-12 DIAGNOSIS — R110 Nausea: Secondary | ICD-10-CM | POA: Diagnosis not present

## 2018-05-12 MED ORDER — ONDANSETRON HCL 4 MG PO TABS
4.0000 mg | ORAL_TABLET | Freq: Three times a day (TID) | ORAL | 1 refills | Status: DC | PRN
Start: 2018-05-12 — End: 2018-10-20

## 2018-05-12 MED ORDER — RABEPRAZOLE SODIUM 20 MG PO TBEC: 20.0000 mg | DELAYED_RELEASE_TABLET | Freq: Two times a day (BID) | ORAL | 3 refills | Status: DC

## 2018-05-12 NOTE — Patient Instructions (Signed)
I have increased Aciphex to twice a day, 30 minutes before breakfast and dinner.   I have sent in Zofran (nausea medication) to your pharmacy to take every 8 hours as needed for nausea.  You can add a probiotic daily as we discussed.  If you have persistent watery stools, worsening abdominal pain, or no improvement at all in your symptoms, please call right away.  Let me know how you are doing next week. I will make sure we see you in 3 months just to ensure things are going ok!  It was a pleasure to see you today. I strive to create trusting relationships with patients to provide genuine, compassionate, and quality care. I value your feedback. If you receive a survey regarding your visit,  I greatly appreciate you taking time to fill this out.   Annitta Needs, PhD, ANP-BC [facility]

## 2018-05-12 NOTE — Progress Notes (Signed)
Referring Provider: Lavonia Dana, MD Primary Care Physician:  Lavonia Dana, MD  Primary GI: Dr. Gala Romney   Chief Complaint  Patient presents with  . Nausea  . Bloated  . Diarrhea  . decreased appetite  . Abdominal Pain    HPI:   SHARISSE RANTZ is a 67 y.o. female presenting today with a history of GERD,  IBS. Last colonoscopy and EGD in 2012. Last seen in Jan 2019 and was doing well. In interim from last appt, she was seen in ED Aug 5 and prescribed Z pack for bronchitis.   Lower abdominal discomfort, quivering, feels bloated. For 3 weeks. Discomfort intermittent. Extremely nauseated at times. No vomiting. Not daily. Will drink ginger ale when feeling nauseated. No aggravating factors. Takes Aleve when needed. Has been taking "right many" since hurting her back in April 2019. Sometimes without an appetite. Has had two episodes of diarrhea this week, Sunday and Monday. Not having diarrhea every day. Will have occasional constipation. No rectal bleeding. One day was cold. No fever. Aciphex chronically. Protonix not helpful in the past. Prilosec in the past.    Past Medical History:  Diagnosis Date  . Anxiety disorder   . Asthma   . Diverticulosis   . GERD (gastroesophageal reflux disease)   . Hemorrhoids   . Hypertension   . IBS (irritable bowel syndrome)   . S/P colonoscopy 12/23/10   Dr Rourk-diverticulosis, anal canal hemorhhoids, anal papilla, random bx benign  . S/P endoscopy 12/23/10   Dr Girard Cooter, tubular esophagus, sm HH, minimal retained food, bile-stained mucus, otherwise normal,, esophageal bx  benign    Past Surgical History:  Procedure Laterality Date  . CHOLECYSTECTOMY    . cold knife conization    . COLONOSCOPY  06/08/2006   internal hemorrhoids otherwise noraml colon and rectum  . COLONOSCOPY  11/2010   Dr. Gala Romney: Anal canal hemorrhoids, diverticulosis, random colon biopsies unremarkable.  . egd/tcs  11/2010   see PMH  .  ESOPHAGOGASTRODUODENOSCOPY  06/08/2006   single distal erosion otherwise normal  . ESOPHAGOGASTRODUODENOSCOPY  11/2010   Dr. Gala Romney: Longitudinal for reading of the tubular esophagus, no evidence of eosinophilic esophagitis on biopsy, small hiatal hernia, minimal amount of retained food, bile-stained mucosa.  . left great toe surgery    . TONSILLECTOMY    . WRIST FRACTURE SURGERY  07/2013   right    Current Outpatient Medications  Medication Sig Dispense Refill  . albuterol (PROAIR HFA) 108 (90 Base) MCG/ACT inhaler Use 2 puffs every 4 hours as needed for cough or wheeze.  May use 2 puffs 10-20 minutes prior to exercise. 1 Inhaler 1  . Ascorbic Acid (VITAMIN C GUMMIE PO) Take by mouth as needed.    Marland Kitchen aspirin 81 MG tablet Take 81 mg by mouth daily.      . Azelastine-Fluticasone (DYMISTA) 137-50 MCG/ACT SUSP Use one spray in each nostril one to two times a day as directed. (Patient taking differently: as needed. Use one spray in each nostril one to two times a day as directed.) 1 Bottle 2  . bismuth subsalicylate (PEPTO BISMOL) 262 MG/15ML suspension Take 30 mLs by mouth as needed.    . calcium carbonate (TUMS - DOSED IN MG ELEMENTAL CALCIUM) 500 MG chewable tablet Chew 1 tablet by mouth as needed for indigestion or heartburn.    . Cholecalciferol (VITAMIN D3) 2000 UNITS CHEW Chew 1 tablet by mouth daily.    Marland Kitchen escitalopram (LEXAPRO) 5 MG  tablet Take 5 mg by mouth daily.    Marland Kitchen ezetimibe (ZETIA) 10 MG tablet Take 10 mg by mouth daily.      . fluticasone-salmeterol (ADVAIR HFA) 230-21 MCG/ACT inhaler Inhale 2 puffs into the lungs 2 (two) times daily. (Patient taking differently: Inhale 2 puffs into the lungs as needed. ) 3 Inhaler 2  . loratadine (CLARITIN) 10 MG tablet Take 10 mg by mouth daily as needed.     Marland Kitchen LORazepam (ATIVAN) 0.5 MG tablet Take 0.5 mg by mouth as needed for anxiety.    Marland Kitchen losartan-hydrochlorothiazide (HYZAAR) 50-12.5 MG per tablet Take 1 tablet by mouth daily.      . metoprolol  (TOPROL-XL) 50 MG 24 hr tablet Take 50 mg by mouth daily.      . multivitamin (THERAGRAN) per tablet Take 1 tablet by mouth daily.      . naproxen sodium (ALEVE) 220 MG tablet Take 220 mg by mouth as needed.    . Omega-3 Fatty Acids (FISH OIL) 1000 MG CAPS Take by mouth daily.      . RABEprazole (ACIPHEX) 20 MG tablet Take one tablet before breakfast daily. May take additional dose before evening meal if needed. 90 tablet 3  . azithromycin (ZITHROMAX Z-PAK) 250 MG tablet 2 po day one, then 1 daily x 4 days (Patient not taking: Reported on 05/12/2018) 6 tablet 0  . Cranberry-Vitamin C-Probiotic (AZO CRANBERRY PO) Take 2 tablets by mouth daily.     . ondansetron (ZOFRAN ODT) 4 MG disintegrating tablet 4mg  ODT q4 hours prn nausea/vomit (Patient not taking: Reported on 05/12/2018) 12 tablet 0   No current facility-administered medications for this visit.     Allergies as of 05/12/2018 - Review Complete 05/12/2018  Allergen Reaction Noted  . Conj estrog-medroxyprogest ace    . Ketorolac tromethamine    . Tramadol hcl      Family History  Problem Relation Age of Onset  . Stomach cancer Father 19  . Throat cancer Mother 32  . Diabetes Brother   . Transient ischemic attack Brother   . Colon cancer Neg Hx     Social History   Socioeconomic History  . Marital status: Married    Spouse name: Not on file  . Number of children: 1  . Years of education: Not on file  . Highest education level: Not on file  Occupational History    Employer: UNEMPLOYED  Social Needs  . Financial resource strain: Not on file  . Food insecurity:    Worry: Not on file    Inability: Not on file  . Transportation needs:    Medical: Not on file    Non-medical: Not on file  Tobacco Use  . Smoking status: Former Smoker    Packs/day: 0.10    Years: 3.00    Pack years: 0.30    Types: Cigarettes    Last attempt to quit: 09/30/1987    Years since quitting: 30.6  . Smokeless tobacco: Never Used  . Tobacco  comment: pt states she smoked socially  Substance and Sexual Activity  . Alcohol use: No  . Drug use: No  . Sexual activity: Not on file  Lifestyle  . Physical activity:    Days per week: Not on file    Minutes per session: Not on file  . Stress: Not on file  Relationships  . Social connections:    Talks on phone: Not on file    Gets together: Not on file  Attends religious service: Not on file    Active member of club or organization: Not on file    Attends meetings of clubs or organizations: Not on file    Relationship status: Not on file  Other Topics Concern  . Not on file  Social History Narrative  . Not on file    Review of Systems: Gen: Denies fever, chills, anorexia. Denies fatigue, weakness, weight loss.  CV: Denies chest pain, palpitations, syncope, peripheral edema, and claudication. Resp: Denies dyspnea at rest, cough, wheezing, coughing up blood, and pleurisy. GI: see HPI  Derm: Denies rash, itching, dry skin Psych: Denies depression, anxiety, memory loss, confusion. No homicidal or suicidal ideation.  Heme: Denies bruising, bleeding, and enlarged lymph nodes.  Physical Exam: BP 129/83   Pulse 72   Temp 97.6 F (36.4 C) (Oral)   Ht 5\' 7"  (1.702 m)   Wt 176 lb 12.8 oz (80.2 kg)   BMI 27.69 kg/m  General:   Alert and oriented. No distress noted. Pleasant and cooperative.  Head:  Normocephalic and atraumatic. Eyes:  Conjuctiva clear without scleral icterus. Mouth:  Oral mucosa pink and moist.  Abdomen:  +BS, soft, non-tender and non-distended. No rebound or guarding. No HSM or masses noted. Msk:  Symmetrical without gross deformities. Normal posture. Extremities:  Without edema. Neurologic:  Alert and  oriented x4 Psych:  Alert and cooperative. Normal mood and affect.

## 2018-05-13 ENCOUNTER — Encounter: Payer: Self-pay | Admitting: Internal Medicine

## 2018-05-13 MED ORDER — RABEPRAZOLE SODIUM 20 MG PO TBEC: DELAYED_RELEASE_TABLET | ORAL | 11 refills | Status: DC

## 2018-05-19 ENCOUNTER — Telehealth: Payer: Self-pay | Admitting: Gastroenterology

## 2018-05-19 MED ORDER — RABEPRAZOLE SODIUM 20 MG PO TBEC: 20.0000 mg | DELAYED_RELEASE_TABLET | Freq: Two times a day (BID) | ORAL | 3 refills | Status: DC

## 2018-05-19 NOTE — Telephone Encounter (Signed)
Done. Jim Desanctis to hear she is better!

## 2018-05-19 NOTE — Addendum Note (Signed)
Addended by: Annitta Needs on: 05/19/2018 04:57 PM   Modules accepted: Orders

## 2018-05-19 NOTE — Telephone Encounter (Signed)
Patient called with an update and said she feels a lot better.   Also, her generic aciphex was sent to [facility] [place] but it needs to be sent in mail order   Please call [phone]

## 2018-05-19 NOTE — Telephone Encounter (Signed)
Routing message to AB. Please send rx to mail pharmacy.

## 2018-05-20 NOTE — Progress Notes (Signed)
CC'D TO PCP °

## 2018-05-20 NOTE — Assessment & Plan Note (Signed)
Vague, associated nausea, occasional diarrhea but not persistent. Start probiotic. Call if 3 loose stools per day or persistent and will check stool studies.

## 2018-05-20 NOTE — Assessment & Plan Note (Signed)
Increase Aciphex to BID for short term. 3 month return.

## 2018-05-20 NOTE — Assessment & Plan Note (Signed)
Vague nausea, no vomiting, taking Aleve routinely since April 2019. Currently on Aciphex daily. No outright abdominal pain. Increase Aciphex to BID for now, stop NSAIDs. Last EGD in 2012. No urgent need for EGD or imaging unless changes in symptoms. Progress report next week. Zofran also sent for supportive measures. Return in 3 months regardless.

## 2018-06-29 ENCOUNTER — Ambulatory Visit (INDEPENDENT_AMBULATORY_CARE_PROVIDER_SITE_OTHER): Payer: Medicare Other | Admitting: Allergy & Immunology

## 2018-06-29 ENCOUNTER — Encounter: Payer: Self-pay | Admitting: Allergy & Immunology

## 2018-06-29 VITALS — BP 134/84 | HR 64 | Temp 97.9°F | Resp 18 | Ht 67.0 in | Wt 178.4 lb

## 2018-06-29 DIAGNOSIS — K219 Gastro-esophageal reflux disease without esophagitis: Secondary | ICD-10-CM

## 2018-06-29 DIAGNOSIS — J4540 Moderate persistent asthma, uncomplicated: Secondary | ICD-10-CM | POA: Diagnosis not present

## 2018-06-29 DIAGNOSIS — J310 Chronic rhinitis: Secondary | ICD-10-CM | POA: Diagnosis not present

## 2018-06-29 MED ORDER — IPRATROPIUM BROMIDE 0.03 % NA SOLN: NASAL | 3 refills | Status: DC

## 2018-06-29 MED ORDER — ALBUTEROL SULFATE HFA 108 (90 BASE) MCG/ACT IN AERS: INHALATION_SPRAY | RESPIRATORY_TRACT | 1 refills | Status: DC

## 2018-06-29 MED ORDER — FLUTICASONE-SALMETEROL 230-21 MCG/ACT IN AERO: 2.0000 | INHALATION_SPRAY | Freq: Two times a day (BID) | RESPIRATORY_TRACT | 2 refills | Status: DC

## 2018-06-29 NOTE — Progress Notes (Signed)
FOLLOW UP  Date of Service/Encounter:  06/29/18   Assessment:   Moderate persistent asthma, uncomplicated  Chronic rhinitis  Gastroesophageal reflux disease - on PPI   Caitlin Tanner is doing fairly well all things considered.  She remains on her Advair, but is rather inconsistent with its administration.  She does continue to have some postnasal drip issues, but it should be noted that she is not using her Dymista on a regular basis.  Her symptoms seem to be worse when she awakens in the morning, so we will add on nasal ipratropium to see if we can help with this.  I had consider getting blood work to see if we can add on an injectable biologic medication for her asthma, but I believe we can hold off on that for now.    Plan/Recommendations:   1. Moderate persistent asthma, uncomplicated - Lung testing looked stable today. - We will not make any medication changes today, but try taking your Advair every day for the best effect. - Daily controller medication(s): Advair 230/47mcg two puffs twice daily with spacer - Prior to physical activity: ProAir 2 puffs 10-15 minutes before physical activity. - Rescue medications: ProAir 4 puffs every 4-6 hours as needed - Changes during respiratory infections or worsening symptoms: Add on Qvar 92mcg 2 puffs twice daily for ONE TO TWO WEEKS. - Asthma control goals:  * Full participation in all desired activities (may need albuterol before activity) * Albuterol use two time or less a week on average (not counting use with activity) * Cough interfering with sleep two time or less a month * Oral steroids no more than once a year * No hospitalizations  2. Chronic rhinitis - Add on nasal ipratropium two sprays per nostril in the morning when your nose is particularly runny.   - Consider adding on nasal saline rinses up to twice daily.  - Continue with Dymista up to two sprays twice daily. - Continue with over the counter allergy medications as needed.     - Give [place] a call at the end of the week if you are not feeling better and we can send an antibiotic in.  3. Gastroesophageal reflux disease - Continue with Aciphex 20mg  up to twice daily.  4. Return in about 3 months (around 09/29/2018).   Subjective:   Caitlin Tanner is a 67 y.o. female presenting today for follow up of  Chief Complaint  Patient presents with  . Asthma    Caitlin Tanner has a history of the following: Patient Active Problem List   Diagnosis Date Noted  . Nausea without vomiting 07/08/2013  . Need for prophylactic vaccination and inoculation against influenza 06/12/2013  . Well controlled persistent asthma 06/12/2013  . Multiple lung nodules 06/12/2013  . Abdominal bloating 11/24/2011  . Left lower quadrant pain 10/22/2011  . HEMORRHOIDS, INTERNAL 05/29/2009  . GERD 05/29/2009  . HEMATOCHEZIA 05/29/2009  . ANXIETY NEUROSIS 05/28/2009  . IRRITABLE BOWEL SYNDROME 05/28/2009  . DIARRHEA 05/28/2009  . ALLERGY 05/28/2009    History obtained from: chart review and patient.  Caitlin Tanner's Primary Care Provider is Lavonia Dana, MD.     Caitlin Tanner is a 67 y.o. female presenting for a follow up visit.  She has a history of moderate persistent asthma as well as allergic rhinitis.  At the last visit in February 2019, Dr. Neldon Mc gave her a dose of Depo-Medrol 80 mg due to symptoms thought to be related to dust exposure.  She  was continued on Advair 2 puffs twice daily as well as Dymista.  She also has a history of reflux, controlled with as needed AcipHex.  Since the last visit, she has mostly done well. She did well after the last visit. She does report cough and congestion for the past few weeks. She does report that she hurt her back back in April. She did get a cortisone shot with improvement in her back pain.   Asthma/Respiratory Symptom History: She remains on the Advair 230/21 two puffs BID as well as Qvar 33mcg two puffs BID during flares. She does miss her  doses fairly frequently but report sthat she at least does it once daily in the mornings and will do another dose in the evenings when . She does a lot of walking which seems to trigger her symptoms. She does ot wake up at night coughing. She smoked in her 36s, but only socially.    Allergic Rhinitis Symptom History: She does report congestion today that has been ongoing for one week. She did purchase some allergy pills which did not provide much relief at all. She is on her Dymista. Spring and fall are the worse times of the year.  She does not remember when she was tested, but she reports that she was allergic to everything.  She has never been on allergen immunotherapy.  She remains on her Aciphex up to twice daily for her GERD. Otherwise, there have been no changes to her past medical history, surgical history, family history, or social history.    Review of Systems: a 14-point review of systems is pertinent for what is mentioned in HPI.  Otherwise, all other systems were negative. Constitutional: negative other than that listed in the HPI Eyes: negative other than that listed in the HPI Ears, nose, mouth, throat, and face: negative other than that listed in the HPI Respiratory: negative other than that listed in the HPI Cardiovascular: negative other than that listed in the HPI Gastrointestinal: negative other than that listed in the HPI Genitourinary: negative other than that listed in the HPI Integument: negative other than that listed in the HPI Hematologic: negative other than that listed in the HPI Musculoskeletal: negative other than that listed in the HPI Neurological: negative other than that listed in the HPI Allergy/Immunologic: negative other than that listed in the HPI    Objective:   Blood pressure 134/84, pulse 64, temperature 97.9 F (36.6 C), temperature source Oral, resp. rate 18, height 5\' 7"  (1.702 m), weight 178 lb 6.4 oz (80.9 kg), SpO2 95 %. Body mass index is  27.94 kg/m.   Physical Exam:  General: Alert, interactive, in no acute distress. Very pleasant and talkative female.  Eyes: No conjunctival injection bilaterally, no discharge on the right, no discharge on the left and no Horner-Trantas dots present. PERRL bilaterally. EOMI without pain. No photophobia.  Ears: Right TM pearly gray with normal light reflex, Left TM pearly gray with normal light reflex, Right TM intact without perforation and Left TM intact without perforation.  Nose/Throat: External nose within normal limits and septum midline. Turbinates edematous with clear discharge. Posterior oropharynx erythematous without cobblestoning in the posterior oropharynx. Tonsils 2+ without exudates.  Tongue without thrush. Lungs: Clear to auscultation without wheezing, rhonchi or rales. No increased work of breathing. CV: Normal S1/S2. No murmurs. Capillary refill <2 seconds.  Skin: Warm and dry, without lesions or rashes. Neuro:   Grossly intact. No focal deficits appreciated. Responsive to questions.  Diagnostic studies:  Spirometry: results normal (FEV1: 1.33/63%, FVC: 1.93/66%, FEV1/FVC: 68%).    Spirometry consistent with mixed obstructive and restrictive disease. Overall these values are stable compared to those obtained at previous visits.   Allergy Studies: none      Salvatore Marvel, MD  [facility]

## 2018-06-29 NOTE — Patient Instructions (Addendum)
1. Moderate persistent asthma, uncomplicated - Lung testing looked stable today. - We will not make any medication changes today, but try taking your Advair every day for the best effect. - Daily controller medication(s): Advair 230/51mcg two puffs twice daily with spacer - Prior to physical activity: ProAir 2 puffs 10-15 minutes before physical activity. - Rescue medications: ProAir 4 puffs every 4-6 hours as needed - Changes during respiratory infections or worsening symptoms: Add on Qvar 45mcg 2 puffs twice daily for ONE TO TWO WEEKS. - Asthma control goals:  * Full participation in all desired activities (may need albuterol before activity) * Albuterol use two time or less a week on average (not counting use with activity) * Cough interfering with sleep two time or less a month * Oral steroids no more than once a year * No hospitalizations  2. Chronic rhinitis - Add on nasal ipratropium two sprays per nostril in the morning when your nose is particularly runny.   - Consider adding on nasal saline rinses up to twice daily.  - Continue with Dymista up to two sprays twice daily. - Continue with over the counter allergy medications as needed.   - Give [place] a call at the end of the week if you are not feeling better and we can send an antibiotic in.  3. Gastroesophageal reflux disease - Continue with Aciphex 20mg  up to twice daily.  4. Return in about 3 months (around 09/29/2018).   Please inform us of any Emergency Department visits, hospitalizations, or changes in symptoms. Call us before going to the ED for breathing or allergy symptoms since we might be able to fit you in for a sick visit. Feel free to contact us anytime with any questions, problems, or concerns.  It was a pleasure to meet you today!  Websites that have reliable patient information: 1. American Academy of Asthma, Allergy, and Immunology: www.aaaai.org 2. Food Allergy Research and Education (FARE): foodallergy.org 3.  Mothers of Asthmatics: http://www.asthmacommunitynetwork.org 4. American College of Allergy, Asthma, and Immunology: MonthlyElectricBill.co.uk   Make sure you are registered to vote! If you have moved or changed any of your contact information, you will need to get this updated before voting!    You can buy saline nose drops at a pharmacy, or you can make your own saline solution:  1. Add 1 cup (240 mL) distilled water to a clean container. If you use tap water, boil it first to sterilize it, and then let it cool until it is lukewarm.  2. Add 0.5 tsp (2.5 g) non-iondinated salt to the water. 3. Add 0.5 tsp (2.5 g) baking soda.

## 2018-06-30 NOTE — Addendum Note (Signed)
Addended by: Lynnae Sandhoff on: 06/30/2018 09:45 AM   Modules accepted: Orders

## 2018-08-30 ENCOUNTER — Telehealth: Payer: Self-pay

## 2018-08-30 NOTE — Telephone Encounter (Signed)
Please find out what has worked best for her in the past. She has taken both Prilosec and Protonix.

## 2018-08-30 NOTE — Telephone Encounter (Signed)
Please let pharmacy know we will stick with Aciphex.

## 2018-08-30 NOTE — Telephone Encounter (Signed)
Received fax from [facility], Aciphex 20mg  is on backorder. Requesting alternate medication. Fax# [phone].

## 2018-08-30 NOTE — Telephone Encounter (Signed)
Pt said Protonix nor Pantoprazole helped in the past. She has enough of the generic Aciphex on hand for approx 90 days. She just doesn't want anything else at this time.

## 2018-08-31 NOTE — Telephone Encounter (Signed)
I have faxed the note back.

## 2018-09-02 ENCOUNTER — Encounter: Payer: Self-pay | Admitting: Gastroenterology

## 2018-09-02 ENCOUNTER — Ambulatory Visit (INDEPENDENT_AMBULATORY_CARE_PROVIDER_SITE_OTHER): Payer: Medicare Other | Admitting: Gastroenterology

## 2018-09-02 VITALS — BP 144/87 | HR 70 | Temp 97.4°F | Ht 67.0 in | Wt 177.6 lb

## 2018-09-02 DIAGNOSIS — K219 Gastro-esophageal reflux disease without esophagitis: Secondary | ICD-10-CM

## 2018-09-02 NOTE — Patient Instructions (Signed)
I am glad you are doing well!  We will see you in 6-8 months and complete the stool test at that time. If it is negative, your next colonoscopy is in 2022.   Have a wonderful Christmas!  I enjoyed seeing you again today! As you know, I value our relationship and want to provide genuine, compassionate, and quality care. I welcome your feedback. If you receive a survey regarding your visit,  I greatly appreciate you taking time to fill this out. See you next time!  Annitta Needs, PhD, ANP-BC [facility]

## 2018-09-02 NOTE — Assessment & Plan Note (Signed)
67 year old female with chronic GERD and no alarm signs/symptoms. Continue Aciphex once daily. May use occasionally BID if needed. Continue to avoid NSAIDs. Will check ifobt at next visit; otherwise, routine screening colonoscopy in 2022. Return in 6-8 months.

## 2018-09-02 NOTE — Progress Notes (Signed)
CC'D TO PCP °

## 2018-09-02 NOTE — Progress Notes (Signed)
Referring Provider: Lavonia Dana, MD Primary Care Physician:  Thea Alken Primary GI: Dr. Gala Romney   Chief Complaint  Patient presents with  . Gastroesophageal Reflux    occ    HPI:   Caitlin Tanner is a 67 y.o. female presenting today with a history of GERD and IBS. Last colonoscopy and EGD in 2012. Last seen in Aug 2019 and felt to have NSAID-related gastritis causing vague nausea and asked to increase Aciphex to BID for short term, avoiding all NSAIDs going further.   Taking Aciphex once per day, sometimes taking BID. Tried Prevacid, Protonix, Prilosec in past.   Only has loose stool if eats something that doesn't agree with her.   Overall much improved and doing well. Feels like a lot of her symptoms were related to stress when last seen.   Past Medical History:  Diagnosis Date  . Anxiety disorder   . Asthma   . Diverticulosis   . GERD (gastroesophageal reflux disease)   . Hemorrhoids   . Hypertension   . IBS (irritable bowel syndrome)   . S/P colonoscopy 12/23/10   Dr Rourk-diverticulosis, anal canal hemorhhoids, anal papilla, random bx benign  . S/P endoscopy 12/23/10   Dr Girard Cooter, tubular esophagus, sm HH, minimal retained food, bile-stained mucus, otherwise normal,, esophageal bx  benign    Past Surgical History:  Procedure Laterality Date  . CHOLECYSTECTOMY    . cold knife conization    . COLONOSCOPY  06/08/2006   internal hemorrhoids otherwise noraml colon and rectum  . COLONOSCOPY  11/2010   Dr. Gala Romney: Anal canal hemorrhoids, diverticulosis, random colon biopsies unremarkable.  . egd/tcs  11/2010   see PMH  . ESOPHAGOGASTRODUODENOSCOPY  06/08/2006   single distal erosion otherwise normal  . ESOPHAGOGASTRODUODENOSCOPY  11/2010   Dr. Gala Romney: Longitudinal for reading of the tubular esophagus, no evidence of eosinophilic esophagitis on biopsy, small hiatal hernia, minimal amount of retained food, bile-stained mucosa.  . left great toe surgery    .  TONSILLECTOMY    . WRIST FRACTURE SURGERY  07/2013   right    Current Outpatient Medications  Medication Sig Dispense Refill  . albuterol (PROAIR HFA) 108 (90 Base) MCG/ACT inhaler Use 2 puffs every 4 hours as needed for cough or wheeze.  May use 2 puffs 10-20 minutes prior to exercise. 1 Inhaler 1  . aspirin 81 MG tablet Take 81 mg by mouth daily.      . Azelastine-Fluticasone (DYMISTA) 137-50 MCG/ACT SUSP Use one spray in each nostril one to two times a day as directed. (Patient taking differently: as needed. Use one spray in each nostril one to two times a day as directed.) 1 Bottle 2  . bismuth subsalicylate (PEPTO BISMOL) 262 MG/15ML suspension Take 30 mLs by mouth as needed.    . calcium carbonate (TUMS - DOSED IN MG ELEMENTAL CALCIUM) 500 MG chewable tablet Chew 1 tablet by mouth as needed for indigestion or heartburn.    . Cholecalciferol (VITAMIN D3) 2000 UNITS CHEW Chew 1 tablet by mouth daily.    . Cranberry-Vitamin C-Probiotic (AZO CRANBERRY PO) Take by mouth as needed.    Marland Kitchen escitalopram (LEXAPRO) 5 MG tablet Take 5 mg by mouth daily.    Marland Kitchen ezetimibe (ZETIA) 10 MG tablet Take 10 mg by mouth daily.      . fluticasone-salmeterol (ADVAIR HFA) 230-21 MCG/ACT inhaler Inhale 2 puffs into the lungs 2 (two) times daily. (Patient taking differently: Inhale 2 puffs into the lungs  daily. ) 3 Inhaler 2  . loratadine (CLARITIN) 10 MG tablet Take 10 mg by mouth daily as needed.     Marland Kitchen LORazepam (ATIVAN) 0.5 MG tablet Take 0.5 mg by mouth as needed for anxiety.    Marland Kitchen losartan-hydrochlorothiazide (HYZAAR) 50-12.5 MG per tablet Take 1 tablet by mouth daily.      . metoprolol (TOPROL-XL) 50 MG 24 hr tablet Take 50 mg by mouth daily.      . multivitamin (THERAGRAN) per tablet Take 1 tablet by mouth daily.      . naproxen sodium (ALEVE) 220 MG tablet Take 220 mg by mouth as needed.    . Omega-3 Fatty Acids (FISH OIL) 1000 MG CAPS Take by mouth daily.      . ondansetron (ZOFRAN) 4 MG tablet Take 1 tablet  (4 mg total) by mouth every 8 (eight) hours as needed for nausea or vomiting. 30 tablet 1  . RABEprazole (ACIPHEX) 20 MG tablet Take one tablet before breakfast daily. May take additional dose before evening meal if needed. 60 tablet 11  . Ascorbic Acid (VITAMIN C GUMMIE PO) Take by mouth as needed.    Marland Kitchen ipratropium (ATROVENT) 0.03 % nasal spray 2 sprays each nostril in the morning with runny nose. (Patient not taking: Reported on 09/02/2018) 30 mL 3  . RABEprazole (ACIPHEX) 20 MG tablet Take 1 tablet (20 mg total) by mouth 2 (two) times daily before a meal. (Patient not taking: Reported on 09/02/2018) 60 tablet 3   No current facility-administered medications for this visit.     Allergies as of 09/02/2018 - Review Complete 09/02/2018  Allergen Reaction Noted  . Conj estrog-medroxyprogest ace    . Ketorolac tromethamine    . Tramadol hcl      Family History  Problem Relation Age of Onset  . Stomach cancer Father 5  . Throat cancer Mother 98  . Diabetes Brother   . Transient ischemic attack Brother   . Colon cancer Neg Hx     Social History   Socioeconomic History  . Marital status: Married    Spouse name: Not on file  . Number of children: 1  . Years of education: Not on file  . Highest education level: Not on file  Occupational History    Employer: UNEMPLOYED  Social Needs  . Financial resource strain: Not on file  . Food insecurity:    Worry: Not on file    Inability: Not on file  . Transportation needs:    Medical: Not on file    Non-medical: Not on file  Tobacco Use  . Smoking status: Former Smoker    Packs/day: 0.10    Years: 3.00    Pack years: 0.30    Types: Cigarettes    Last attempt to quit: 09/30/1987    Years since quitting: 30.9  . Smokeless tobacco: Never Used  . Tobacco comment: pt states she smoked socially  Substance and Sexual Activity  . Alcohol use: No  . Drug use: No  . Sexual activity: Not on file  Lifestyle  . Physical activity:    Days  per week: Not on file    Minutes per session: Not on file  . Stress: Not on file  Relationships  . Social connections:    Talks on phone: Not on file    Gets together: Not on file    Attends religious service: Not on file    Active member of club or organization: Not on file  Attends meetings of clubs or organizations: Not on file    Relationship status: Not on file  Other Topics Concern  . Not on file  Social History Narrative  . Not on file    Review of Systems: Gen: Denies fever, chills, anorexia. Denies fatigue, weakness, weight loss.  CV: Denies chest pain, palpitations, syncope, peripheral edema, and claudication. Resp: Denies dyspnea at rest, cough, wheezing, coughing up blood, and pleurisy. GI: see HPI  Derm: Denies rash, itching, dry skin Psych: Denies depression, anxiety, memory loss, confusion. No homicidal or suicidal ideation.  Heme: Denies bruising, bleeding, and enlarged lymph nodes.  Physical Exam: BP (!) 144/87   Pulse 70   Temp (!) 97.4 F (36.3 C) (Oral)   Ht 5\' 7"  (1.702 m)   Wt 177 lb 9.6 oz (80.6 kg)   BMI 27.82 kg/m  General:   Alert and oriented. No distress noted. Pleasant and cooperative.  Head:  Normocephalic and atraumatic. Eyes:  Conjuctiva clear without scleral icterus. Mouth:  Oral mucosa pink and moist.  Abdomen:  +BS, soft, non-tender and non-distended. No rebound or guarding. No HSM or masses noted. Msk:  Symmetrical without gross deformities. Normal posture. Extremities:  Without edema. Neurologic:  Alert and  oriented x4 Psych:  Alert and cooperative. Normal mood and affect.

## 2018-10-01 ENCOUNTER — Ambulatory Visit: Payer: Medicare Other | Admitting: Allergy & Immunology

## 2018-10-01 DIAGNOSIS — J309 Allergic rhinitis, unspecified: Secondary | ICD-10-CM

## 2018-10-20 ENCOUNTER — Encounter: Payer: Self-pay | Admitting: Allergy & Immunology

## 2018-10-20 ENCOUNTER — Ambulatory Visit (INDEPENDENT_AMBULATORY_CARE_PROVIDER_SITE_OTHER): Payer: Medicare Other | Admitting: Allergy & Immunology

## 2018-10-20 VITALS — BP 110/70 | HR 66 | Resp 18 | Ht 67.0 in | Wt 180.0 lb

## 2018-10-20 DIAGNOSIS — J4540 Moderate persistent asthma, uncomplicated: Secondary | ICD-10-CM | POA: Diagnosis not present

## 2018-10-20 DIAGNOSIS — K219 Gastro-esophageal reflux disease without esophagitis: Secondary | ICD-10-CM | POA: Diagnosis not present

## 2018-10-20 DIAGNOSIS — J310 Chronic rhinitis: Secondary | ICD-10-CM | POA: Diagnosis not present

## 2018-10-20 NOTE — Patient Instructions (Addendum)
1. Moderate persistent asthma, uncomplicated - Lung testing looked stable today. - We will not make any medication changes today. - Daily controller medication(s): Advair 230/40mcg two puffs twice daily with spacer - Prior to physical activity: ProAir 2 puffs 10-15 minutes before physical activity. - Rescue medications: ProAir 4 puffs every 4-6 hours as needed - Changes during respiratory infections or worsening symptoms: Add on Qvar 32mcg 2 puffs twice daily for ONE TO TWO WEEKS. - Asthma control goals:  * Full participation in all desired activities (may need albuterol before activity) * Albuterol use two time or less a week on average (not counting use with activity) * Cough interfering with sleep two time or less a month * Oral steroids no more than once a year * No hospitalizations  2. Chronic rhinitis - Continue with nasal ipratropium two sprays per nostril in the morning when your nose is particularly runny.   - Continue with the a nasal saline rinses up to twice daily.   - Continue with Dymista up to two sprays twice daily. - Continue with over the counter allergy medications as needed.    3. Gastroesophageal reflux disease - Continue with Aciphex 20mg  up to twice daily.  4. Return in about 6 months (around 04/20/2019).   Please inform us of any Emergency Department visits, hospitalizations, or changes in symptoms. Call us before going to the ED for breathing or allergy symptoms since we might be able to fit you in for a sick visit. Feel free to contact us anytime with any questions, problems, or concerns.  It was a pleasure to see you again today!  Websites that have reliable patient information: 1. American Academy of Asthma, Allergy, and Immunology: www.aaaai.org 2. Food Allergy Research and Education (FARE): foodallergy.org 3. Mothers of Asthmatics: http://www.asthmacommunitynetwork.org 4. American College of Allergy, Asthma, and Immunology: MonthlyElectricBill.co.uk   Make sure  you are registered to vote! If you have moved or changed any of your contact information, you will need to get this updated before voting!

## 2018-10-20 NOTE — Progress Notes (Signed)
FOLLOW UP  Date of Service/Encounter:  10/20/18   Assessment:   Moderate persistent asthma, uncomplicated  Chronic rhinitis  Gastroesophageal reflux disease - on PPI   Plan/Recommendations:   1. Moderate persistent asthma, uncomplicated - Lung testing looked stable today. - We will not make any medication changes today. - Daily controller medication(s): Advair 230/35mcg two puffs twice daily with spacer - Prior to physical activity: ProAir 2 puffs 10-15 minutes before physical activity. - Rescue medications: ProAir 4 puffs every 4-6 hours as needed - Changes during respiratory infections or worsening symptoms: Add on Qvar 63mcg 2 puffs twice daily for ONE TO TWO WEEKS. - Asthma control goals:  * Full participation in all desired activities (may need albuterol before activity) * Albuterol use two time or less a week on average (not counting use with activity) * Cough interfering with sleep two time or less a month * Oral steroids no more than once a year * No hospitalizations  2. Chronic rhinitis - Continue with nasal ipratropium two sprays per nostril in the morning when your nose is particularly runny.   - Continue with the a nasal saline rinses up to twice daily.   - Continue with Dymista up to two sprays twice daily. - Continue with over the counter allergy medications as needed.    3. Gastroesophageal reflux disease - Continue with Aciphex 20mg  up to twice daily.  4. Return in about 6 months (around 04/20/2019).  Subjective:   Caitlin Tanner is a 68 y.o. female presenting today for follow up of  Chief Complaint  Patient presents with  . Asthma    she has been doinmg pretty good with her asthma. some nasal congestion.     Caitlin Tanner has a history of the following: Patient Active Problem List   Diagnosis Date Noted  . Nausea without vomiting 07/08/2013  . Need for prophylactic vaccination and inoculation against influenza 06/12/2013  . Well controlled  persistent asthma 06/12/2013  . Multiple lung nodules 06/12/2013  . Abdominal bloating 11/24/2011  . Left lower quadrant pain 10/22/2011  . HEMORRHOIDS, INTERNAL 05/29/2009  . GERD 05/29/2009  . HEMATOCHEZIA 05/29/2009  . ANXIETY NEUROSIS 05/28/2009  . IRRITABLE BOWEL SYNDROME 05/28/2009  . DIARRHEA 05/28/2009  . ALLERGY 05/28/2009    History obtained from: chart review and patient.  Caitlin Tanner's Primary Care Provider is Caitlin, Tanner is a 68 y.o. female presenting for a follow up visit. She was last seen in October 2019 at which time she was doing fairly well with inconsistent use of her medications.  We recommended improved compliance, especially with her Advair inhaler.  For her rhinitis, we added on nasal ipratropium as needed.  We continue Dymista as well as over-the-counter allergy antihistamines.  We continued AcipHex for her GERD.  Since the last visit, she has done very well. She feels that she is doing better than last time.   Asthma/Respiratory Symptom History: She remains on the Advair two puffs BID. She missed three doses last week. [name] asthma has been well controlled. She has not required rescue medication, experienced nocturnal awakenings due to lower respiratory symptoms, nor have activities of daily living been limited. She has required no Emergency Department or Urgent Care visits for her asthma. She has required zero courses of systemic steroids for asthma exacerbations since the last visit. ACT score today is 22, indicating excellent asthma symptom control.   Allergic Rhinitis Symptom History: She does report continued  congestion. She is using the nasal saline rinses daily. She has the Dymista daily. She is not using the ipratropium daily. She does endorse a nodule in her nose and a dry sensation. She is wondering what she can do about this. She has never used nasal saline gel.   Otherwise, there have been no changes to her past medical history,  surgical history, family history, or social history.    Review of Systems: a 14-point review of systems is pertinent for what is mentioned in HPI.  Otherwise, all other systems were negative.  Constitutional: negative other than that listed in the HPI Eyes: negative other than that listed in the HPI Ears, nose, mouth, throat, and face: negative other than that listed in the HPI Respiratory: negative other than that listed in the HPI Cardiovascular: negative other than that listed in the HPI Gastrointestinal: negative other than that listed in the HPI Genitourinary: negative other than that listed in the HPI Integument: negative other than that listed in the HPI Hematologic: negative other than that listed in the HPI Musculoskeletal: negative other than that listed in the HPI Neurological: negative other than that listed in the HPI Allergy/Immunologic: negative other than that listed in the HPI    Objective:   Blood pressure 110/70, pulse 66, resp. rate 18, height 5\' 7"  (1.702 m), weight 180 lb (81.6 kg), SpO2 95 %. Body mass index is 28.19 kg/m.   Physical Exam:  General: Alert, interactive, in no acute distress. Talkative female.  Eyes: No conjunctival injection bilaterally, no discharge on the right, no discharge on the left and no Horner-Trantas dots present. PERRL bilaterally. EOMI without pain. No photophobia.  Ears: Right TM pearly gray with normal light reflex, Left TM pearly gray with normal light reflex, Right TM intact without perforation and Left TM intact without perforation.  Nose/Throat: External nose within normal limits and septum midline. Turbinates edematous and pale with clear discharge. Posterior oropharynx erythematous without cobblestoning in the posterior oropharynx. Tonsils 2+ without exudates.  Tongue without thrush. Lungs: Clear to auscultation without wheezing, rhonchi or rales. No increased work of breathing. CV: Normal S1/S2. No murmurs. Capillary  refill <2 seconds.  Skin: Warm and dry, without lesions or rashes. Neuro:   Grossly intact. No focal deficits appreciated. Responsive to questions.  Diagnostic studies:   Spirometry: results abnormal (FEV1: 1.45/64%, FVC: 1.92/91%, FEV1/FVC: 75%).    Spirometry consistent with possible restrictive disease. Overall values are stable and the FEV1 is slightly more than the last visit  Allergy Studies: none      Salvatore Marvel, MD  [facility]

## 2018-11-26 ENCOUNTER — Encounter: Payer: Self-pay | Admitting: Allergy & Immunology

## 2018-11-26 ENCOUNTER — Other Ambulatory Visit: Payer: Self-pay

## 2018-11-26 ENCOUNTER — Ambulatory Visit (INDEPENDENT_AMBULATORY_CARE_PROVIDER_SITE_OTHER): Payer: Medicare Other | Admitting: Allergy & Immunology

## 2018-11-26 VITALS — BP 132/78 | HR 70 | Temp 97.9°F | Resp 16

## 2018-11-26 DIAGNOSIS — J310 Chronic rhinitis: Secondary | ICD-10-CM

## 2018-11-26 DIAGNOSIS — K219 Gastro-esophageal reflux disease without esophagitis: Secondary | ICD-10-CM

## 2018-11-26 DIAGNOSIS — J0100 Acute maxillary sinusitis, unspecified: Secondary | ICD-10-CM | POA: Diagnosis not present

## 2018-11-26 DIAGNOSIS — J4540 Moderate persistent asthma, uncomplicated: Secondary | ICD-10-CM | POA: Diagnosis not present

## 2018-11-26 MED ORDER — AMOXICILLIN-POT CLAVULANATE 875-125 MG PO TABS: 1.0000 | ORAL_TABLET | Freq: Two times a day (BID) | ORAL | 0 refills | Status: AC

## 2018-11-26 NOTE — Patient Instructions (Addendum)
1. Moderate persistent asthma, uncomplicated - Lung testing looked stable today. - We will not make any medication changes today. - Daily controller medication(s): Advair 230/35mcg two puffs twice daily with spacer - Prior to physical activity: ProAir 2 puffs 10-15 minutes before physical activity. - Rescue medications: ProAir 4 puffs every 4-6 hours as needed - Changes during respiratory infections or worsening symptoms: Add on Qvar 44mcg 2 puffs twice daily for ONE TO TWO WEEKS. - Asthma control goals:  * Full participation in all desired activities (may need albuterol before activity) * Albuterol use two time or less a week on average (not counting use with activity) * Cough interfering with sleep two time or less a month * Oral steroids no more than once a year * No hospitalizations  2. Chronic rhinitis - with overlying acute sinusitis - Continue with nasal ipratropium two sprays per nostril in the morning when your nose is particularly runny.   - Continue with the a nasal saline rinses up to twice daily.   - Continue with Dymista up to two sprays twice daily. - Continue with over the counter allergy medications as needed.    3. Acute sinusitis - With your current symptoms and time course, antibiotics are needed: Augmentin 875mg  twice daily for 10 days - Add on nasal saline spray (i.e., Simply Saline) or nasal saline lavage (i.e., NeilMed) as needed prior to medicated nasal sprays. - For thick post nasal drainage, add guaifenesin [phone] mg (Mucinex) twice daily as needed for mucous thinning with adequate hydration to help it work.   4. Gastroesophageal reflux disease - Continue with Aciphex 20mg  up to twice daily.  5. Return in about 3 months (around 02/24/2019).   Please inform us of any Emergency Department visits, hospitalizations, or changes in symptoms. Call us before going to the ED for breathing or allergy symptoms since we might be able to fit you in for a sick visit. Feel  free to contact us anytime with any questions, problems, or concerns.  It was a pleasure to see you again today!  Websites that have reliable patient information: 1. American Academy of Asthma, Allergy, and Immunology: www.aaaai.org 2. Food Allergy Research and Education (FARE): foodallergy.org 3. Mothers of Asthmatics: http://www.asthmacommunitynetwork.org 4. American College of Allergy, Asthma, and Immunology: MonthlyElectricBill.co.uk   Make sure you are registered to vote! If you have moved or changed any of your contact information, you will need to get this updated before voting!

## 2018-11-26 NOTE — Progress Notes (Signed)
FOLLOW UP  Date of Service/Encounter:  11/26/18   Assessment:   Moderate persistent asthma, uncomplicated  Chronic rhinitis  Gastroesophageal reflux disease- on PPI  Acute sinusitis  Plan/Recommendations:   1. Moderate persistent asthma, uncomplicated - Lung testing looked stable today. - We will not make any medication changes today. - Daily controller medication(s): Advair 230/76mcg two puffs twice daily with spacer - Prior to physical activity: ProAir 2 puffs 10-15 minutes before physical activity. - Rescue medications: ProAir 4 puffs every 4-6 hours as needed - Changes during respiratory infections or worsening symptoms: Add on Qvar 85mcg 2 puffs twice daily for ONE TO TWO WEEKS. - Asthma control goals:  * Full participation in all desired activities (may need albuterol before activity) * Albuterol use two time or less a week on average (not counting use with activity) * Cough interfering with sleep two time or less a month * Oral steroids no more than once a year * No hospitalizations  2. Chronic rhinitis - with overlying acute sinusitis - Continue with nasal ipratropium two sprays per nostril in the morning when your nose is particularly runny.   - Continue with the a nasal saline rinses up to twice daily.   - Continue with Dymista up to two sprays twice daily. - Continue with over the counter allergy medications as needed.    3. Acute sinusitis - With your current symptoms and time course, antibiotics are needed: Augmentin 875mg  twice daily for 10 days - Add on nasal saline spray (i.e., Simply Saline) or nasal saline lavage (i.e., NeilMed) as needed prior to medicated nasal sprays. - For thick post nasal drainage, add guaifenesin [phone] mg (Mucinex) twice daily as needed for mucous thinning with adequate hydration to help it work.   4. Gastroesophageal reflux disease - Continue with Aciphex 20mg  up to twice daily.  5. Return in about 3 months (around  02/24/2019).  Subjective:   Caitlin Tanner is a 68 y.o. female presenting today for follow up of  Chief Complaint  Patient presents with  . Nasal Congestion    chest congestion & head acongestion. ear pain. aching, weakness. ongoing since 2nd of Februaru after exposure to grandson & fiance on 10-30-2018. seen by pcp and given prednisone, antibiotic and tessalon perles. did get better, but now she feels like the symptoms have returned.     Caitlin Tanner has a history of the following: Patient Active Problem List   Diagnosis Date Noted  . Nausea without vomiting 07/08/2013  . Need for prophylactic vaccination and inoculation against influenza 06/12/2013  . Well controlled persistent asthma 06/12/2013  . Multiple lung nodules 06/12/2013  . Abdominal bloating 11/24/2011  . Left lower quadrant pain 10/22/2011  . HEMORRHOIDS, INTERNAL 05/29/2009  . GERD 05/29/2009  . HEMATOCHEZIA 05/29/2009  . ANXIETY NEUROSIS 05/28/2009  . IRRITABLE BOWEL SYNDROME 05/28/2009  . DIARRHEA 05/28/2009  . ALLERGY 05/28/2009    History obtained from: chart review and patient.  Amit is a 68 y.o. female presenting for a sick visit.  She was last seen in January 2020.  At that time, her lung testing looks stable.  We continued Advair 230/21 mcg 2 puffs twice daily.  For chronic rhinitis we continue nasal Atrovent 2 sprays per nostril in the morning.  We also continue nasal saline rinses and Dymista.  For her GERD, we continued AcipHex 20 mg up to twice daily.  In the interim, she has developed chest congestion & head congestion, ear pain. aching, weakness.  ongoing since 2nd of Februaru after exposure to grandson & fiance on 10-30-2018. She was seen by PCP and given prednisone, antibiotic and tessalon perles. Symptoms did get better, but now she feels like the symptoms have returned. Her grandson and his fiancee has confirmed flu. She finished all of her medications (10 days of antibiotics and 5 days of prednisone).  She went on February 7th to Urgent Care. She was fine for a few days and then symptoms returned over this past weekend. Some days are worse than others. She is having some headaches and pain in her nose and cheeks. She also has congestion. She has been using her Dymista regularly and she has developed some throat dryness. She has been using her horehound candy.   Asthma/Respiratory Symptom History: She remains on the Advair 230/82mcg 2 puffs twice daily.  She is doing well with this regimen.  ACT score today is 22, indicating excellent asthma control.  She did have one prednisone course, as mentioned above.  She denies any nighttime symptoms or daytime symptoms.  She has had no ER visits for her symptoms.  Allergic Rhinitis Symptom History: She remains on her Dymista 1 to 2 sprays per nostril up to twice daily.  She also uses her nasal saline rinses.  She has not been using her nasal Atrovent quite as much.  She tends to get antibiotics around 1 time per year at the most, typically in the winter.  Otherwise, there have been no changes to her past medical history, surgical history, family history, or social history.    Review of Systems  Constitutional: Negative.  Negative for fever, malaise/fatigue and weight loss.  HENT: Positive for congestion, nosebleeds and sore throat. Negative for ear discharge and ear pain.   Eyes: Negative for pain, discharge and redness.  Respiratory: Positive for cough. Negative for sputum production, shortness of breath and wheezing.   Cardiovascular: Negative.  Negative for chest pain and palpitations.  Gastrointestinal: Negative for abdominal pain and heartburn.  Skin: Negative.  Negative for itching and rash.  Neurological: Negative for dizziness and headaches.  Endo/Heme/Allergies: Negative for environmental allergies. Does not bruise/bleed easily.       Objective:   Blood pressure 132/78, pulse 70, temperature 97.9 F (36.6 C), temperature source Oral,  resp. rate 16, SpO2 96 %. There is no height or weight on file to calculate BMI.   Physical Exam:  Physical Exam  Constitutional: She appears well-developed.  HENT:  Head: Normocephalic and atraumatic.  Right Ear: Tympanic membrane, external ear and ear canal normal.  Left Ear: Tympanic membrane and ear canal normal.  Nose: Mucosal edema and rhinorrhea present. No nasal deformity or septal deviation. No epistaxis. Right sinus exhibits maxillary sinus tenderness. Right sinus exhibits no frontal sinus tenderness. Left sinus exhibits maxillary sinus tenderness. Left sinus exhibits no frontal sinus tenderness.  Mouth/Throat: Uvula is midline and oropharynx is clear and moist. Mucous membranes are not pale and not dry.  Purulent mucus production bilaterally, but worse in the left nares.  Eyes: Pupils are equal, round, and reactive to light. Conjunctivae and EOM are normal. Right eye exhibits no chemosis and no discharge. Left eye exhibits no chemosis and no discharge. Right conjunctiva is not injected. Left conjunctiva is not injected.  Cardiovascular: Normal rate, regular rhythm and normal heart sounds.  Respiratory: Effort normal and breath sounds normal. No accessory muscle usage. No tachypnea. No respiratory distress. She has no wheezes. She has no rhonchi. She has no rales. She exhibits  no tenderness.  Moving air well in all lung fields.  Lymphadenopathy:    She has no cervical adenopathy.  Neurological: She is alert.  Skin: No abrasion, no petechiae and no rash noted. Rash is not papular, not vesicular and not urticarial. No erythema. No pallor.  Psychiatric: She has a normal mood and affect.     Diagnostic studies: none      Salvatore Marvel, MD  [facility]

## 2019-02-23 ENCOUNTER — Ambulatory Visit: Payer: Medicare Other | Admitting: Allergy & Immunology

## 2019-03-09 ENCOUNTER — Ambulatory Visit: Payer: Medicare Other | Admitting: Gastroenterology

## 2019-03-11 ENCOUNTER — Other Ambulatory Visit: Payer: Self-pay

## 2019-03-11 ENCOUNTER — Ambulatory Visit (INDEPENDENT_AMBULATORY_CARE_PROVIDER_SITE_OTHER): Payer: Medicare Other | Admitting: Allergy & Immunology

## 2019-03-11 ENCOUNTER — Encounter: Payer: Self-pay | Admitting: Allergy & Immunology

## 2019-03-11 VITALS — BP 100/68 | Temp 98.3°F | Resp 18 | Ht 67.0 in | Wt 176.0 lb

## 2019-03-11 DIAGNOSIS — K219 Gastro-esophageal reflux disease without esophagitis: Secondary | ICD-10-CM | POA: Diagnosis not present

## 2019-03-11 DIAGNOSIS — J310 Chronic rhinitis: Secondary | ICD-10-CM | POA: Insufficient documentation

## 2019-03-11 DIAGNOSIS — J4540 Moderate persistent asthma, uncomplicated: Secondary | ICD-10-CM | POA: Diagnosis not present

## 2019-03-11 DIAGNOSIS — J0100 Acute maxillary sinusitis, unspecified: Secondary | ICD-10-CM

## 2019-03-11 NOTE — Patient Instructions (Addendum)
1. Moderate persistent asthma, uncomplicated - Lung testing deferred today.  - We will not make any medication changes today. - Daily controller medication(s): Advair 230/62mcg two puffs twice daily with spacer - Prior to physical activity: ProAir 2 puffs 10-15 minutes before physical activity. - Rescue medications: ProAir 4 puffs every 4-6 hours as needed - Changes during respiratory infections or worsening symptoms: Add on Qvar 41mcg 2 puffs twice daily for ONE TO TWO WEEKS. - Asthma control goals:  * Full participation in all desired activities (may need albuterol before activity) * Albuterol use two time or less a week on average (not counting use with activity) * Cough interfering with sleep two time or less a month * Oral steroids no more than once a year * No hospitalizations  2. Chronic rhinitis - with overlying acute (likely viral) sinusitis - Continue with nasal ipratropium two sprays per nostril in the morning when your nose is particularly runny.   - Continue with the a nasal saline rinses up to twice daily.   - Continue with Dymista up to two sprays twice daily. - Start the prednisone pack provided today.  - Call us Monday with an update.  - We may need to send in antibiotics at that time.   3. Gastroesophageal reflux disease - Continue with Aciphex 20mg  up to twice daily.  4. Return in about 3 months (around 06/11/2019).   Please inform us of any Emergency Department visits, hospitalizations, or changes in symptoms. Call us before going to the ED for breathing or allergy symptoms since we might be able to fit you in for a sick visit. Feel free to contact us anytime with any questions, problems, or concerns.  It was a pleasure to see you again today!  Websites that have reliable patient information: 1. American Academy of Asthma, Allergy, and Immunology: www.aaaai.org 2. Food Allergy Research and Education (FARE): foodallergy.org 3. Mothers of Asthmatics:  http://www.asthmacommunitynetwork.org 4. American College of Allergy, Asthma, and Immunology: MonthlyElectricBill.co.uk   Make sure you are registered to vote! If you have moved or changed any of your contact information, you will need to get this updated before voting!

## 2019-03-11 NOTE — Progress Notes (Signed)
FOLLOW UP  Date of Service/Encounter:  03/11/19   Assessment:   Moderate persistent asthma, uncomplicated  Chronic rhinitis  Gastroesophageal reflux disease- on PPI  Acute viral sinusitis - may consider immune work-up if she continues to have sinus infections.  Plan/Recommendations:   1. Moderate persistent asthma, uncomplicated - Lung testing deferred today.  - We will not make any medication changes today. - Daily controller medication(s): Advair 230/16mcg two puffs twice daily with spacer - Prior to physical activity: ProAir 2 puffs 10-15 minutes before physical activity. - Rescue medications: ProAir 4 puffs every 4-6 hours as needed - Changes during respiratory infections or worsening symptoms: Add on Qvar 68mcg 2 puffs twice daily for ONE TO TWO WEEKS. - Asthma control goals:  * Full participation in all desired activities (may need albuterol before activity) * Albuterol use two time or less a week on average (not counting use with activity) * Cough interfering with sleep two time or less a month * Oral steroids no more than once a year * No hospitalizations  2. Chronic rhinitis - with overlying acute (likely viral) sinusitis - Continue with nasal ipratropium two sprays per nostril in the morning when your nose is particularly runny.   - Continue with the a nasal saline rinses up to twice daily.   - Continue with Dymista up to two sprays twice daily. - Start the prednisone pack provided today.  - Call us Monday with an update.  - We may need to send in antibiotics at that time.   3. Gastroesophageal reflux disease - Continue with Aciphex 20mg  up to twice daily.  4. Return in about 3 months (around 06/11/2019).  Subjective:   Caitlin Tanner is a 68 y.o. female presenting today for follow up of  Chief Complaint  Patient presents with   Cough    since monday 03/07/2019   Fatigue   Muscle Pain    Caitlin Tanner has a history of the following: Patient  Active Problem List   Diagnosis Date Noted   Moderate persistent asthma, uncomplicated 59/56/3875   Acute non-recurrent maxillary sinusitis 03/11/2019   Chronic rhinitis 03/11/2019   Nausea without vomiting 07/08/2013   Need for prophylactic vaccination and inoculation against influenza 06/12/2013   Well controlled persistent asthma 06/12/2013   Multiple lung nodules 06/12/2013   Abdominal bloating 11/24/2011   Left lower quadrant pain 10/22/2011   HEMORRHOIDS, INTERNAL 05/29/2009   GERD 05/29/2009   HEMATOCHEZIA 05/29/2009   ANXIETY NEUROSIS 05/28/2009   IRRITABLE BOWEL SYNDROME 05/28/2009   DIARRHEA 05/28/2009   ALLERGY 05/28/2009    History obtained from: chart review and patient.  Caitlin Tanner is a 68 y.o. female presenting for a sick visit.  She was last seen in February 2020.  At that time, her lung testing looks stable.  We made no changes.  We continued Advair 230/21 mcg 2 puffs twice daily.  She has Qvar that she adds on during respiratory flares.  For her chronic rhinitis, we continue nasal ipratropium as well as Dymista and nasal saline.  We also started her on Augmentin 875 mg twice daily for 10 days with Mucinex.  Since last visit, she has mostly done well.  Past weekend, she developed cough as well as a runny nose.  Tuesday her symptoms worsen.  She has had a tactile fever.  She denies any loss of sensation of smell or taste.  She does report a scratchy throat.  She has been using Mucinex as well as Mining engineer  Perles at home with minimal improvement, although it should be noted that she reports turning a corner today.  She has had some nasal discharge.  She denies any sick contacts, but now her husband is developing similar symptoms.  He does tell me that she has been safe with regards to her exposures.  She does wear a mask anytime she is outside.  The most exciting place she has gone is to church.  She did not travel and was not around crowds over the [facility] Day  holiday.  She is otherwise doing fine.  She has used her rescue inhaler a few times due to chest tightness.  For last visit, she has had 2 rounds of antibiotics, but these were for urinary tract infections.  Her last antibiotic was ciprofloxacin which she received at the end of May.  Her last sinus infection was when I saw her in February.  She is not excited about getting another antibiotic.  Otherwise, there have been no changes to her past medical history, surgical history, family history, or social history.    Review of Systems  Constitutional: Positive for chills and fever. Negative for malaise/fatigue and weight loss.  HENT: Positive for congestion, sinus pain and sore throat. Negative for ear discharge and ear pain.   Eyes: Negative for pain, discharge and redness.  Respiratory: Positive for cough and shortness of breath. Negative for sputum production and wheezing.   Cardiovascular: Negative.  Negative for chest pain and palpitations.  Gastrointestinal: Negative for abdominal pain, heartburn, nausea and vomiting.  Skin: Negative.  Negative for itching and rash.  Neurological: Negative for dizziness and headaches.  Endo/Heme/Allergies: Negative for environmental allergies. Does not bruise/bleed easily.       Objective:   Blood pressure 100/68, temperature 98.3 F (36.8 C), temperature source Oral, resp. rate 18, height 5\' 7"  (1.702 m), weight 176 lb (79.8 kg). Body mass index is 27.57 kg/m.   Physical Exam:  Physical Exam  Constitutional: She appears well-developed.  HENT:  Head: Normocephalic and atraumatic.  Right Ear: Tympanic membrane, external ear and ear canal normal.  Left Ear: Tympanic membrane, external ear and ear canal normal.  Nose: Rhinorrhea present. No mucosal edema, nasal deformity or septal deviation. No epistaxis. Right sinus exhibits frontal sinus tenderness. Right sinus exhibits no maxillary sinus tenderness. Left sinus exhibits frontal sinus  tenderness. Left sinus exhibits no maxillary sinus tenderness.  Mouth/Throat: Uvula is midline and oropharynx is clear and moist. Mucous membranes are not pale and not dry.  She does have some mucopurulent discharge bilaterally nares.  Eyes: Pupils are equal, round, and reactive to light. Conjunctivae and EOM are normal. Right eye exhibits no chemosis and no discharge. Left eye exhibits no chemosis and no discharge. Right conjunctiva is not injected. Left conjunctiva is not injected.  Cardiovascular: Normal rate, regular rhythm and normal heart sounds.  Respiratory: Effort normal and breath sounds normal. No accessory muscle usage. No tachypnea. No respiratory distress. She has no wheezes. She has no rhonchi. She has no rales. She exhibits no tenderness.  Lungs are clear.  There are no crackles or wheezes noted.  Lymphadenopathy:    She has no cervical adenopathy.  Neurological: She is alert.  Skin: No abrasion, no petechiae and no rash noted. Rash is not papular, not vesicular and not urticarial. No erythema. No pallor.  Psychiatric: She has a normal mood and affect.     Diagnostic studies: none     Salvatore Marvel, MD  [facility]

## 2019-03-14 ENCOUNTER — Telehealth: Payer: Self-pay

## 2019-03-14 MED ORDER — AMOXICILLIN-POT CLAVULANATE 875-125 MG PO TABS: 1.0000 | ORAL_TABLET | Freq: Two times a day (BID) | ORAL | 0 refills | Status: AC

## 2019-03-14 NOTE — Telephone Encounter (Signed)
Patient called back to let Dr Ernst Bowler know how she was feelings. Patient is worse and coughing a lot more. Her husband was diagnosed with strep throat on Friday.  Please Advise.    [facility] in [place]

## 2019-03-14 NOTE — Telephone Encounter (Signed)
Left voice message to return call 

## 2019-03-14 NOTE — Telephone Encounter (Signed)
Patient informed. 

## 2019-03-14 NOTE — Telephone Encounter (Signed)
Noted  

## 2019-03-14 NOTE — Telephone Encounter (Signed)
Continue with all the treatments recommended last week.  I also sent in a course of Augmentin for her.  This should cover strep throat as well as other sinopulmonary infections.  Salvatore Marvel, MD [facility]

## 2019-04-20 ENCOUNTER — Ambulatory Visit: Payer: Medicare Other | Admitting: Allergy & Immunology

## 2019-06-09 ENCOUNTER — Ambulatory Visit (INDEPENDENT_AMBULATORY_CARE_PROVIDER_SITE_OTHER): Payer: Medicare Other | Admitting: Gastroenterology

## 2019-06-09 ENCOUNTER — Other Ambulatory Visit: Payer: Self-pay

## 2019-06-09 ENCOUNTER — Encounter: Payer: Self-pay | Admitting: Gastroenterology

## 2019-06-09 VITALS — BP 140/83 | HR 77 | Temp 97.1°F | Ht 67.0 in | Wt 179.4 lb

## 2019-06-09 DIAGNOSIS — R1013 Epigastric pain: Secondary | ICD-10-CM | POA: Diagnosis not present

## 2019-06-09 DIAGNOSIS — K921 Melena: Secondary | ICD-10-CM | POA: Diagnosis not present

## 2019-06-09 MED ORDER — HYDROCORTISONE (PERIANAL) 2.5 % EX CREA: 1.0000 "application " | TOPICAL_CREAM | Freq: Two times a day (BID) | CUTANEOUS | 1 refills | Status: DC

## 2019-06-09 NOTE — Progress Notes (Signed)
Referring Provider: Thea Alken, FNP Primary Care Physician:  Thea Alken  Primary GI: Dr. Gala Romney   Chief Complaint  Patient presents with   Abdominal Pain   Bloated   Nausea    occ, no vomiting    HPI:   Caitlin Tanner is a 68 y.o. female presenting today with a history of  GERD and IBS. Last colonoscopy and EGD in 2012.  Abdominal bloating with associated nausea, sometimes postprandially. No worsening GERD. Burping a lot. No dysphagia, no odynophagia. No melena or hematochezia. Sometimes some early satiety. Good appetite. Has a bad taste in her mouth but not all the time. Aciphex once to twice a day. Will take it twice if eats something that doesn't agree with her. Apple cider vinegar started 2 weeks ago but symptoms have been present for about a month. Abdomen feels uncomfortable but no pain. Located upper/lower abdomen. No fear of eating. Not happening every day. Happening 4 times per week. Unsure what food precipitates symptoms. Drinks ginger ale to help. Will sometimes drink a coke to help relieve. Gallbladder absent. Last year had taken Aleve frequently and had worsening dyspepsia but improved after avoiding. Still taking Aleve prn. Had round of steroids in July.   Some constipation, but this is rare. No diarrhea unless sneaks and eats some ice cream.   Notes prolapsing hemorrhoids, Grade 3 from description. Sometimes burning if diarrhea. Uses cleansing wipes with aloe. Prolonged toilet time. Intermittent hematochezia.   Past Medical History:  Diagnosis Date   Anxiety disorder    Asthma    Diverticulosis    GERD (gastroesophageal reflux disease)    Hemorrhoids    Hypertension    IBS (irritable bowel syndrome)    S/P colonoscopy 12/23/10   Dr Rourk-diverticulosis, anal canal hemorhhoids, anal papilla, random bx benign   S/P endoscopy 12/23/10   Dr Girard Cooter, tubular esophagus, sm HH, minimal retained food, bile-stained mucus, otherwise normal,,  esophageal bx  benign    Past Surgical History:  Procedure Laterality Date   CHOLECYSTECTOMY     cold knife conization     COLONOSCOPY  06/08/2006   internal hemorrhoids otherwise noraml colon and rectum   COLONOSCOPY  11/2010   Dr. Gala Romney: Anal canal hemorrhoids, diverticulosis, random colon biopsies unremarkable.   egd/tcs  11/2010   see PMH   ESOPHAGOGASTRODUODENOSCOPY  06/08/2006   single distal erosion otherwise normal   ESOPHAGOGASTRODUODENOSCOPY  11/2010   Dr. Gala Romney: Longitudinal for reading of the tubular esophagus, no evidence of eosinophilic esophagitis on biopsy, small hiatal hernia, minimal amount of retained food, bile-stained mucosa.   left great toe surgery     TONSILLECTOMY     WRIST FRACTURE SURGERY  07/2013   right    Current Outpatient Medications  Medication Sig Dispense Refill   albuterol (PROAIR HFA) 108 (90 Base) MCG/ACT inhaler Use 2 puffs every 4 hours as needed for cough or wheeze.  May use 2 puffs 10-20 minutes prior to exercise. 1 Inhaler 1   APPLE CIDER VINEGAR PO Take by mouth. Organic apple cider vinegar 1 tablespoon by mouth daily     aspirin 81 MG tablet Take 81 mg by mouth daily.       Azelastine-Fluticasone (DYMISTA) 137-50 MCG/ACT SUSP Use one spray in each nostril one to two times a day as directed. (Patient taking differently: as needed. Use one spray in each nostril one to two times a day as directed.) 1 Bottle 2   bismuth subsalicylate (PEPTO BISMOL)  262 MG/15ML suspension Take 30 mLs by mouth as needed.     calcium carbonate (TUMS - DOSED IN MG ELEMENTAL CALCIUM) 500 MG chewable tablet Chew 1 tablet by mouth as needed for indigestion or heartburn.     Cholecalciferol (VITAMIN D3) 2000 UNITS CHEW Chew 1 tablet by mouth daily.     Cranberry-Vitamin C-Probiotic (AZO CRANBERRY PO) Take by mouth as needed.     escitalopram (LEXAPRO) 5 MG tablet Take 5 mg by mouth daily.     ezetimibe (ZETIA) 10 MG tablet Take 10 mg by mouth daily.        fluticasone-salmeterol (ADVAIR HFA) 230-21 MCG/ACT inhaler Inhale 2 puffs into the lungs 2 (two) times daily. (Patient taking differently: Inhale 2 puffs into the lungs daily. ) 3 Inhaler 2   loratadine (CLARITIN) 10 MG tablet Take 10 mg by mouth daily as needed.      LORazepam (ATIVAN) 0.5 MG tablet Take 0.5 mg by mouth as needed for anxiety.     losartan-hydrochlorothiazide (HYZAAR) 50-12.5 MG per tablet Take 1 tablet by mouth daily.       metoprolol (TOPROL-XL) 50 MG 24 hr tablet Take 50 mg by mouth daily.       multivitamin (THERAGRAN) per tablet Take 1 tablet by mouth daily.       naproxen sodium (ALEVE) 220 MG tablet Take 220 mg by mouth as needed.     Omega-3 Fatty Acids (FISH OIL) 1000 MG CAPS Take by mouth daily.       RABEprazole (ACIPHEX) 20 MG tablet Take one tablet before breakfast daily. May take additional dose before evening meal if needed. 60 tablet 11   No current facility-administered medications for this visit.     Allergies as of 06/09/2019 - Review Complete 06/09/2019  Allergen Reaction Noted   Conj estrog-medroxyprogest ace     Ketorolac tromethamine     Tramadol hcl      Family History  Problem Relation Age of Onset   Stomach cancer Father 57   Throat cancer Mother 59   Diabetes Brother    Transient ischemic attack Brother    Colon cancer Neg Hx     Social History   Socioeconomic History   Marital status: Married    Spouse name: Not on file   Number of children: 1   Years of education: Not on file   Highest education level: Not on file  Occupational History    Employer: UNEMPLOYED  Social Designer, fashion/clothing strain: Not on file   Food insecurity    Worry: Not on file    Inability: Not on file   Transportation needs    Medical: Not on file    Non-medical: Not on file  Tobacco Use   Smoking status: Former Smoker    Packs/day: 0.10    Years: 3.00    Pack years: 0.30    Types: Cigarettes    Quit date:  09/30/1987    Years since quitting: 31.7   Smokeless tobacco: Never Used   Tobacco comment: pt states she smoked socially  Substance and Sexual Activity   Alcohol use: No   Drug use: No   Sexual activity: Not on file  Lifestyle   Physical activity    Days per week: Not on file    Minutes per session: Not on file   Stress: Not on file  Relationships   Social connections    Talks on phone: Not on file    Gets together:  Not on file    Attends religious service: Not on file    Active member of club or organization: Not on file    Attends meetings of clubs or organizations: Not on file    Relationship status: Not on file  Other Topics Concern   Not on file  Social History Narrative   Not on file    Review of Systems: Gen: Denies fever, chills, anorexia. Denies fatigue, weakness, weight loss.  CV: Denies chest pain, palpitations, syncope, peripheral edema, and claudication. Resp: Denies dyspnea at rest, cough, wheezing, coughing up blood, and pleurisy. GI: see HPI Derm: Denies rash, itching, dry skin Psych: Denies depression, anxiety, memory loss, confusion. No homicidal or suicidal ideation.  Heme: see HPI  Physical Exam: BP 140/83    Pulse 77    Temp (!) 97.1 F (36.2 C) (Temporal)    Ht 5\' 7"  (1.702 m)    Wt 179 lb 6.4 oz (81.4 kg)    BMI 28.10 kg/m  General:   Alert and oriented. No distress noted. Pleasant and cooperative.  Head:  Normocephalic and atraumatic. Eyes:  Conjuctiva clear without scleral icterus. Lungs: clear bilaterally Cardiac: S1 S2 present without murmurs Abdomen:  +BS, soft, non-tender and non-distended. No rebound or guarding. No HSM or masses noted. Rectal: external hemorrhoids, anoscopy without mass, internal hemorrhoids Msk:  Symmetrical without gross deformities. Normal posture. Extremities:  Without edema. Neurologic:  Alert and  oriented x4 Psych:  Alert and cooperative. Normal mood and affect.

## 2019-06-09 NOTE — Patient Instructions (Addendum)
I have sent in a cream to use per rectum twice a day for 7 days, then take a break.   Add Benefiber 2 teaspoons to your diet every day, and increase to twice a day as tolerated.  Limit toilet time to 2-3 minutes and avoid straining.  We have arranged a colonoscopy and upper endoscopy with Dr. Gala Romney in the near future.  I will see you after the procedure!  Further recommendations to follow!  I enjoyed seeing you again today! As you know, I value our relationship and want to provide genuine, compassionate, and quality care. I welcome your feedback. If you receive a survey regarding your visit,  I greatly appreciate you taking time to fill this out. See you next time!  Annitta Needs, PhD, ANP-BC [facility]   Hemorrhoids Hemorrhoids are swollen veins in and around the rectum or anus. There are two types of hemorrhoids:  Internal hemorrhoids. These occur in the veins that are just inside the rectum. They may poke through to the outside and become irritated and painful.  External hemorrhoids. These occur in the veins that are outside the anus and can be felt as a painful swelling or hard lump near the anus. Most hemorrhoids do not cause serious problems, and they can be managed with home treatments such as diet and lifestyle changes. If home treatments do not help the symptoms, procedures can be done to shrink or remove the hemorrhoids. What are the causes? This condition is caused by increased pressure in the anal area. This pressure may result from various things, including:  Constipation.  Straining to have a bowel movement.  Diarrhea.  Pregnancy.  Obesity.  Sitting for long periods of time.  Heavy lifting or other activity that causes you to strain.  Anal sex.  Riding a bike for a long period of time. What are the signs or symptoms? Symptoms of this condition include:  Pain.  Anal itching or irritation.  Rectal bleeding.  Leakage of stool  (feces).  Anal swelling.  One or more lumps around the anus. How is this diagnosed? This condition can often be diagnosed through a visual exam. Other exams or tests may also be done, such as:  An exam that involves feeling the rectal area with a gloved hand (digital rectal exam).  An exam of the anal canal that is done using a small tube (anoscope).  A blood test, if you have lost a significant amount of blood.  A test to look inside the colon using a flexible tube with a camera on the end (sigmoidoscopy or colonoscopy). How is this treated? This condition can usually be treated at home. However, various procedures may be done if dietary changes, lifestyle changes, and other home treatments do not help your symptoms. These procedures can help make the hemorrhoids smaller or remove them completely. Some of these procedures involve surgery, and others do not. Common procedures include:  Rubber band ligation. Rubber bands are placed at the base of the hemorrhoids to cut off their blood supply.  Sclerotherapy. Medicine is injected into the hemorrhoids to shrink them.  Infrared coagulation. A type of light energy is used to get rid of the hemorrhoids.  Hemorrhoidectomy surgery. The hemorrhoids are surgically removed, and the veins that supply them are tied off.  Stapled hemorrhoidopexy surgery. The surgeon staples the base of the hemorrhoid to the rectal wall. Follow these instructions at home: Eating and drinking   Eat foods that have a lot of fiber in  them, such as whole grains, beans, nuts, fruits, and vegetables.  Ask your health care provider about taking products that have added fiber (fiber supplements).  Reduce the amount of fat in your diet. You can do this by eating low-fat dairy products, eating less red meat, and avoiding processed foods.  Drink enough fluid to keep your urine pale yellow. Managing pain and swelling   Take warm sitz baths for 20 minutes, 3-4 times a  day to ease pain and discomfort. You may do this in a bathtub or using a portable sitz bath that fits over the toilet.  If directed, apply ice to the affected area. Using ice packs between sitz baths may be helpful. ? Put ice in a plastic bag. ? Place a towel between your skin and the bag. ? Leave the ice on for 20 minutes, 2-3 times a day. General instructions  Take over-the-counter and prescription medicines only as told by your health care provider.  Use medicated creams or suppositories as told.  Get regular exercise. Ask your health care provider how much and what kind of exercise is best for you. In general, you should do moderate exercise for at least 30 minutes on most days of the week (150 minutes each week). This can include activities such as walking, biking, or yoga.  Go to the bathroom when you have the urge to have a bowel movement. Do not wait.  Avoid straining to have bowel movements.  Keep the anal area dry and clean. Use wet toilet paper or moist towelettes after a bowel movement.  Do not sit on the toilet for long periods of time. This increases blood pooling and pain.  Keep all follow-up visits as told by your health care provider. This is important. Contact a health care provider if you have:  Increasing pain and swelling that are not controlled by treatment or medicine.  Difficulty having a bowel movement, or you are unable to have a bowel movement.  Pain or inflammation outside the area of the hemorrhoids. Get help right away if you have:  Uncontrolled bleeding from your rectum. Summary  Hemorrhoids are swollen veins in and around the rectum or anus.  Most hemorrhoids can be managed with home treatments such as diet and lifestyle changes.  Taking warm sitz baths can help ease pain and discomfort.  In severe cases, procedures or surgery can be done to shrink or remove the hemorrhoids. This information is not intended to replace advice given to you by  your health care provider. Make sure you discuss any questions you have with your health care provider. Document Released: 09/12/2000 Document Revised: 09/23/2018 Document Reviewed: 02/04/2018 Elsevier Patient Education  2020 [employer].

## 2019-06-14 ENCOUNTER — Ambulatory Visit: Payer: Medicare Other | Admitting: Gastroenterology

## 2019-06-14 ENCOUNTER — Other Ambulatory Visit: Payer: Self-pay | Admitting: Gastroenterology

## 2019-06-16 ENCOUNTER — Telehealth: Payer: Self-pay | Admitting: *Deleted

## 2019-06-16 NOTE — Telephone Encounter (Signed)
Called patient, not receiving calls at this time.

## 2019-06-16 NOTE — Assessment & Plan Note (Signed)
68 year old female with persistent belching, early satiety, vague abdominal discomfort despite Aciphex once to BID dosing, gallbladder absent, previously taking Aleve frequently but now has discontinued. With last EGD in 2012, recommend diagnostic EGD in near future.  Proceed with upper endoscopy in the near future with Dr. Gala Romney. The risks, benefits, and alternatives have been discussed in detail with patient. They have stated understanding and desire to proceed.  Propofol

## 2019-06-16 NOTE — Assessment & Plan Note (Signed)
Intermittent low-volume hematochezia likely due to internal hemorrhoids. May be a good candidate for hemorrhoid banding. As last colonoscopy in 2012, will update this first. Possible banding thereafter as outpatient if appropriate.   Proceed with TCS with Dr. Gala Romney in near future: the risks, benefits, and alternatives have been discussed with the patient in detail. The patient states understanding and desires to proceed. Propofol

## 2019-06-19 NOTE — Progress Notes (Signed)
CC'ED TO PCP 

## 2019-06-20 NOTE — Telephone Encounter (Signed)
Received message patient not receiving calls. Letter mailed to call

## 2019-06-24 ENCOUNTER — Telehealth: Payer: Self-pay | Admitting: Internal Medicine

## 2019-06-24 NOTE — Telephone Encounter (Signed)
RX was sent to pharmacy on 06/14/2019. Pt states [facility] said she needs a [facility]. [facility]

## 2019-06-24 NOTE — Telephone Encounter (Signed)
Patient called and said she needs her prescription sent to [facility] rab[facility]ole

## 2019-06-27 NOTE — Telephone Encounter (Signed)
Tried calling [facility] and wasn't able to get through. Will call them back.

## 2019-06-30 ENCOUNTER — Other Ambulatory Visit: Payer: Self-pay

## 2019-06-30 ENCOUNTER — Telehealth: Payer: Self-pay | Admitting: Internal Medicine

## 2019-06-30 NOTE — Telephone Encounter (Signed)
Lmom, waiting on a return call. I've tried calling the number on the insurance card and can't get a live person on the phone. Will see if pt has an RX card that was provided by her insurance company with the correct numbers.

## 2019-06-30 NOTE — Telephone Encounter (Signed)
Pt returned call. Pt gave me the contact number from her prescription card [phone].  Called pt's insurance carrier. Pt's insurance carrier wouldn't speak to me, since I'm a third party. They said they will discuss with the pt her options of medication since Rabeprazole is no longer covered under pt's insurance. Pt is aware and will contact her insurance company tomorrow. Pt will call back once she's discussed her medication with her insurance carrier.

## 2019-06-30 NOTE — Telephone Encounter (Signed)
[facility] called office, pt is out of Aciphex and requests ref[facility] be [facility] [place].

## 2019-06-30 NOTE — Telephone Encounter (Signed)
Pt returning call. [phone]

## 2019-06-30 NOTE — Telephone Encounter (Signed)
Called pt, informed her our office will call her to schedule TCS/EGD w/Propofol w/RMR when January schedule is available. She request we call home number 1st [phone]), cell number 2nd [phone].

## 2019-06-30 NOTE — Telephone Encounter (Signed)
Pt received letter to call to schedule her TCS and EGD. [phone]

## 2019-07-01 ENCOUNTER — Other Ambulatory Visit: Payer: Self-pay | Admitting: Gastroenterology

## 2019-07-01 MED ORDER — RABEPRAZOLE SODIUM 20 MG PO TBEC: DELAYED_RELEASE_TABLET | ORAL | 3 refills | Status: DC

## 2019-07-05 ENCOUNTER — Other Ambulatory Visit: Payer: Self-pay

## 2019-07-05 ENCOUNTER — Telehealth: Payer: Self-pay

## 2019-07-05 MED ORDER — SUPREP BOWEL PREP KIT 17.5-3.13-1.6 GM/177ML PO SOLN: 1.0000 | ORAL | 0 refills | Status: DC

## 2019-07-05 NOTE — Telephone Encounter (Signed)
Called pt, TCS/EGD w/Propofol w/RMR scheduled for 10/06/19 at 9:00am. Pt requests low volume prep d/t problems tolerating Tri-Lyte in past. Rx for Suprep sent to pharmacy.

## 2019-07-06 ENCOUNTER — Other Ambulatory Visit: Payer: Self-pay

## 2019-07-06 DIAGNOSIS — R1013 Epigastric pain: Secondary | ICD-10-CM

## 2019-07-06 DIAGNOSIS — K921 Melena: Secondary | ICD-10-CM

## 2019-07-07 NOTE — Telephone Encounter (Signed)
Prescription clarification request form for Rabeprazole  20 mg [facility]ed [facility] per [facility]. clarification form will be scanned in pts chart.

## 2019-07-11 NOTE — Telephone Encounter (Signed)
Pre-op appt 10/04/19 at 11:00am, COVID test at 12:05pm. Appt letter mailed with procedure instructions.

## 2019-10-03 ENCOUNTER — Telehealth: Payer: Self-pay | Admitting: Internal Medicine

## 2019-10-03 NOTE — Telephone Encounter (Signed)
Called pt, TCS/EGD w/Propofol w/RMR rescheduled to 12/15/19 at 12:15pm. Endo scheduler informed.

## 2019-10-03 NOTE — Telephone Encounter (Signed)
[phone] PLEASE CALL PATIENT, INJURED LEG AND NEEDS TO RESCHEDULE HER PROCEDURE

## 2019-10-03 NOTE — Telephone Encounter (Signed)
Pre-op and COVID test 12/13/19. Appt letter mailed with new procedure instructions. 

## 2019-10-04 ENCOUNTER — Encounter (HOSPITAL_COMMUNITY): Admission: RE | Admit: 2019-10-04 | Payer: Medicare Other | Source: Ambulatory Visit

## 2019-10-04 ENCOUNTER — Other Ambulatory Visit (HOSPITAL_COMMUNITY): Payer: Medicare Other

## 2019-11-24 ENCOUNTER — Encounter: Payer: Self-pay | Admitting: Allergy & Immunology

## 2019-11-24 ENCOUNTER — Ambulatory Visit (INDEPENDENT_AMBULATORY_CARE_PROVIDER_SITE_OTHER): Payer: Medicare Other | Admitting: Allergy & Immunology

## 2019-11-24 DIAGNOSIS — J4540 Moderate persistent asthma, uncomplicated: Secondary | ICD-10-CM

## 2019-11-24 DIAGNOSIS — J0100 Acute maxillary sinusitis, unspecified: Secondary | ICD-10-CM | POA: Diagnosis not present

## 2019-11-24 DIAGNOSIS — J310 Chronic rhinitis: Secondary | ICD-10-CM | POA: Diagnosis not present

## 2019-11-24 MED ORDER — LORATADINE 10 MG PO TABS: 10.0000 mg | ORAL_TABLET | Freq: Every day | ORAL | 5 refills | Status: DC | PRN

## 2019-11-24 MED ORDER — ALBUTEROL SULFATE HFA 108 (90 BASE) MCG/ACT IN AERS: INHALATION_SPRAY | RESPIRATORY_TRACT | 2 refills | Status: DC

## 2019-11-24 MED ORDER — FLUTICASONE-SALMETEROL 230-21 MCG/ACT IN AERO: 2.0000 | INHALATION_SPRAY | Freq: Two times a day (BID) | RESPIRATORY_TRACT | 5 refills | Status: DC

## 2019-11-24 MED ORDER — AZELASTINE-FLUTICASONE 137-50 MCG/ACT NA SUSP: NASAL | 5 refills | Status: DC

## 2019-11-24 MED ORDER — PREDNISONE 10 MG PO TABS: ORAL_TABLET | ORAL | 0 refills | Status: DC

## 2019-11-24 NOTE — Patient Instructions (Signed)
1. Moderate persistent asthma, uncomplicated - Lung testing deferred today.  - We are going to refill your medications.  - Daily controller medication(s): Advair 230/33mcg two puffs twice daily with spacer - Prior to physical activity: ProAir 2 puffs 10-15 minutes before physical activity. - Rescue medications: ProAir 4 puffs every 4-6 hours as needed - Changes during respiratory infections or worsening symptoms: Add on Qvar 4mcg 2 puffs twice daily for ONE TO TWO WEEKS. - Asthma control goals:  * Full participation in all desired activities (may need albuterol before activity) * Albuterol use two time or less a week on average (not counting use with activity) * Cough interfering with sleep two time or less a month * Oral steroids no more than once a year * No hospitalizations  2. Chronic rhinitis - with overlying acute (likely viral) sinusitis - Continue with nasal ipratropium two sprays per nostril in the morning when your nose is particularly runny.   - Continue with the a nasal saline rinses up to twice daily.   - Continue with Dymista up to two sprays twice daily. - Start the prednisone taper sent in.   -   3. Gastroesophageal reflux disease - Continue with Aciphex 20mg  up to twice daily.  4. Return in about 3 months (around 02/21/2020). This can be an in-person, a virtual Webex or a telephone follow up visit.   Please inform us of any Emergency Department visits, hospitalizations, or changes in symptoms. Call us before going to the ED for breathing or allergy symptoms since we might be able to fit you in for a sick visit. Feel free to contact us anytime with any questions, problems, or concerns.  It was a pleasure to talk to you today today!  Websites that have reliable patient information: 1. American Academy of Asthma, Allergy, and Immunology: www.aaaai.org 2. Food Allergy Research and Education (FARE): foodallergy.org 3. Mothers of Asthmatics:  http://www.asthmacommunitynetwork.org 4. American College of Allergy, Asthma, and Immunology: www.acaai.org   COVID-19 Vaccine Information can be found at: ShippingScam.co.uk For questions related to vaccine distribution or appointments, please email vaccine@[email or web address] or call [phone].     "Like" [place] on Facebook and Instagram for our latest updates!        Make sure you are registered to vote! If you have moved or changed any of your contact information, you will need to get this updated before voting!  In some cases, you MAY be able to register to vote online: [email or web address]

## 2019-11-24 NOTE — Progress Notes (Signed)
RE: Caitlin Tanner MRN: [id number] DOB: Jun 28, 1951 Date of Telemedicine Visit: 11/24/2019  Referring provider: Thea Alken, FNP Primary care provider: Thea Alken  Chief Complaint: Nasal Congestion (with sinus and chest congestion. Advair expired. Can't find Dymista.)   Telemedicine Follow Up Visit via Telephone: I connected with Caitlin Tanner for a follow up on 11/25/19 by telephone and verified that I am speaking with the correct person using two identifiers.   I discussed the limitations, risks, security and privacy concerns of performing an evaluation and management service by telephone and the availability of in person appointments. I also discussed with the patient that there may be a patient responsible charge related to this service. The patient expressed understanding and agreed to proceed.  Patient is at home.  Provider is at the office.  Visit start time: 1:33 PM Visit end time: 1:48 PM Insurance consent/check in by: [place] consent and medical assistant/nurse: Olivia Mackie  History of Present Illness:  She is a 69 y.o. female, who is being followed for moderate persistent asthma as well as chronic rhinitis and GERD. Her previous allergy office visit was in June 2020 with myself. She was last seen in June 2020. At that time, we deferred her lung testing. We continued with Advair 230/21 two puffs BID as well as albuterol as needed. For her rhinitis, we continued with nasal Atrovent PRN as well as Dymista. We also started her on a prednisone dose pack for presumed viral sinusitis. She never called back to request an antibiotic, so presumably the prednisone alone worked well. For her GERD, we continued with Aciphex 2m BID.   Since the last visit, she has mostly done well. She ran out of her medications since the last visit. She has been using what she has, but it is out of date. Everything has been so peculiar with this year that it throws everything off.   Around two  days ago, she was getting more congested. She continues to have the intermittent rhinorrhea and congestion. She will have some stuffiness sand then she starts having rhinorrhea. She will use the Dymista which opens her up. She has not needed steroids since the last visit. But she did have a UTI or two since then. Her last round of antibiotics was in December 2020 for UTI (not sinusitis or pneumonia).   She remains on the Advair 230/21 two puffs BID. Caitlin Tanner's asthma has been well controlled. She has not required rescue medication, experienced nocturnal awakenings due to lower respiratory symptoms, nor have activities of daily living been limited. She has required no Emergency Department or Urgent Care visits for her asthma. She has required zero courses of systemic steroids for asthma exacerbations since the last visit. ACT score today is 19, indicating excellent asthma symptom control.   She is on her nose sprays, but again she ran out of all of her medications. She did not realize that she needed to be seen in six months, which is why it has been almost 9 months since the last visit.   Otherwise, there have been no changes to her past medical history, surgical history, family history, or social history.  Assessment and Plan:  JMarshaylais a 69y.o. female with:  Moderate persistent asthma, uncomplicated  Chronic rhinitis  Gastroesophageal reflux disease- on PPI  Acute viral sinusitis - may consider immune work-up if she continues to have sinus infections.    1. Moderate persistent asthma, uncomplicated - Lung testing deferred today.  - We  are going to refill your medications.  - Daily controller medication(s): Advair 230/44mg two puffs twice daily with spacer - Prior to physical activity: ProAir 2 puffs 10-15 minutes before physical activity. - Rescue medications: ProAir 4 puffs every 4-6 hours as needed - Changes during respiratory infections or worsening symptoms: Add on Qvar 811m 2  puffs twice daily for ONE TO TWO WEEKS. - Asthma control goals:  * Full participation in all desired activities (may need albuterol before activity) * Albuterol use two time or less a week on average (not counting use with activity) * Cough interfering with sleep two time or less a month * Oral steroids no more than once a year * No hospitalizations  2. Chronic rhinitis - with overlying acute (likely viral) sinusitis - Continue with nasal ipratropium two sprays per nostril in the morning when your nose is particularly runny.   - Continue with the a nasal saline rinses up to twice daily.   - Continue with Dymista up to two sprays twice daily. - Start the prednisone taper sent in.   -   3. Gastroesophageal reflux disease - Continue with Aciphex 2089mp to twice daily.  4. Return in about 3 months (around 02/21/2020). This can be an in-person, a virtual Webex or a telephone follow up visit.  Diagnostics: None.  Medication List:  Current Outpatient Medications  Medication Sig Dispense Refill  . albuterol (PROAIR HFA) 108 (90 Base) MCG/ACT inhaler Use 2 puffs every 4 hours as needed for cough or wheeze.  May use 2 puffs 10-20 minutes prior to exercise. 18 g 2  . aspirin 81 MG tablet Take 81 mg by mouth daily.      . Azelastine-Fluticasone (DYMISTA) 137-50 MCG/ACT SUSP Use one spray in each nostril one to two times a day as directed. 23 g 5  . bismuth subsalicylate (PEPTO BISMOL) 262 MG/15ML suspension Take 30 mLs by mouth daily as needed for indigestion or diarrhea or loose stools.     . calcium carbonate (TUMS - DOSED IN MG ELEMENTAL CALCIUM) 500 MG chewable tablet Chew 1 tablet by mouth as needed for indigestion or heartburn.    . Cholecalciferol (VITAMIN D3) 25 MCG (1000 UT) CAPS Take 2,000 Units by mouth daily.     . Cranberry-Vitamin C-Probiotic (AZO CRANBERRY PO) Take 1 tablet by mouth daily as needed (UTI).     . eMarland Kitchencitalopram (LEXAPRO) 10 MG tablet Take 10 mg by mouth daily.     .  Marland Kitchenzetimibe (ZETIA) 10 MG tablet Take 10 mg by mouth daily.      . fluticasone-salmeterol (ADVAIR HFA) 230-21 MCG/ACT inhaler Inhale 2 puffs into the lungs 2 (two) times daily. 1 Inhaler 5  . hydrocortisone (ANUSOL-HC) 2.5 % rectal cream Place 1 application rectally 2 (two) times daily. 30 g 1  . loratadine (CLARITIN) 10 MG tablet Take 1 tablet (10 mg total) by mouth daily as needed for allergies. 30 tablet 5  . LORazepam (ATIVAN) 0.5 MG tablet Take 0.5 mg by mouth daily as needed for anxiety.     . lMarland Kitchensartan-hydrochlorothiazide (HYZAAR) 50-12.5 MG per tablet Take 1 tablet by mouth daily.      . metoprolol (TOPROL-XL) 50 MG 24 hr tablet Take 50 mg by mouth daily.      . multivitamin (THERAGRAN) per tablet Take 1 tablet by mouth daily.      . Na Sulfate-K Sulfate-Mg Sulf (SUPREP BOWEL PREP KIT) 17.5-3.13-1.6 GM/177ML SOLN Take 1 kit by mouth as directed. 354 mL 0  .  Omega-3 Fatty Acids (FISH OIL) 1000 MG CAPS Take 2,000 mg by mouth daily.     . pantoprazole (PROTONIX) 40 MG tablet Take 1 tablet (40 mg total) by mouth daily. Please specify directions, refills and quantity 30 tablet 5  . predniSONE (DELTASONE) 10 MG tablet Take two tablets (45m) twice daily for three days, then one tablet (162m twice daily for three days, then STOP. 18 tablet 0   No current facility-administered medications for this visit.   Allergies: Allergies  Allergen Reactions  . Conj Estrog-Medroxyprogest Ace     REACTION: UNKNOWN REACTION  . Ketorolac Tromethamine     Felt like she had "bugs in her head"  . Sulfa Antibiotics Nausea And Vomiting  . Tramadol Hcl Nausea Only   I reviewed her past medical history, social history, family history, and environmental history and no significant changes have been reported from previous visits.  Review of Systems  Constitutional: Negative for activity change and appetite change.  HENT: Negative for congestion, postnasal drip, rhinorrhea, sinus pressure and sore throat.   Eyes:  Negative for pain, discharge, redness and itching.  Respiratory: Negative for shortness of breath, wheezing and stridor.   Gastrointestinal: Negative for diarrhea, nausea and vomiting.  Musculoskeletal: Negative for arthralgias, joint swelling and myalgias.  Skin: Negative for rash.  Allergic/Immunologic: Negative for environmental allergies and food allergies.    Objective:  Physical exam not obtained as encounter was done via telephone.   Previous notes and tests were reviewed.  I discussed the assessment and treatment plan with the patient. The patient was provided an opportunity to ask questions and all were answered. The patient agreed with the plan and demonstrated an understanding of the instructions.   The patient was advised to call back or seek an in-person evaluation if the symptoms worsen or if the condition fails to improve as anticipated.  I provided 15 minutes of non-face-to-face time during this encounter.  It was my pleasure to participate in [place] today. Please feel free to contact me with any questions or concerns.   Sincerely,  JoValentina ShaggyMD

## 2019-11-25 ENCOUNTER — Encounter: Payer: Self-pay | Admitting: Allergy & Immunology

## 2019-12-09 NOTE — Patient Instructions (Signed)
Caitlin Tanner  12/09/2019     @PREFPERIOPPHARMACY @   Your procedure is scheduled on  12/15/2019.  Report to [facility] at  [place]  A.M.  Call this number if you have problems the morning of surgery:  [phone]   Remember:  Follow the diet and prep instructions given to you by Dr Roseanne Kaufman office                      Take these medicines the morning of surgery with A SIP OF WATER  Lexapro, ativan(if needed), metoprolol, protonix. Use your inhalers before you come.    Do not wear jewelry, make-up or nail polish.  Do not wear lotions, powders, or perfumes. Please wear deodorant and brush your teeth.  Do not shave 48 hours prior to surgery.  Men may shave face and neck.  Do not bring valuables to the hospital.  [facility] is not responsible for any belongings or valuables.  Contacts, dentures or bridgework may not be worn into surgery.  Leave your suitcase in the car.  After surgery it may be brought to your room.  For patients admitted to the hospital, discharge time will be determined by your treatment team.  Patients discharged the day of surgery will not be allowed to drive home.   Name and phone number of your driver:   family Special instructions:  DO NOT smoke the day of your procedure.  Please read over the following fact sheets that you were given. Anesthesia Post-op Instructions and Care and Recovery After Surgery       Upper Endoscopy, Adult, Care After This sheet gives you information about how to care for yourself after your procedure. Your health care provider may also give you more specific instructions. If you have problems or questions, contact your health care provider. What can I expect after the procedure? After the procedure, it is common to have:  A sore throat.  Mild stomach pain or discomfort.  Bloating.  Nausea. Follow these instructions at home:   Follow instructions from your health care provider about what to eat or drink  after your procedure.  Return to your normal activities as told by your health care provider. Ask your health care provider what activities are safe for you.  Take over-the-counter and prescription medicines only as told by your health care provider.  Do not drive for 24 hours if you were given a sedative during your procedure.  Keep all follow-up visits as told by your health care provider. This is important. Contact a health care provider if you have:  A sore throat that lasts longer than one day.  Trouble swallowing. Get help right away if:  You vomit blood or your vomit looks like coffee grounds.  You have: ? A fever. ? Bloody, black, or tarry stools. ? A severe sore throat or you cannot swallow. ? Difficulty breathing. ? Severe pain in your chest or abdomen. Summary  After the procedure, it is common to have a sore throat, mild stomach discomfort, bloating, and nausea.  Do not drive for 24 hours if you were given a sedative during the procedure.  Follow instructions from your health care provider about what to eat or drink after your procedure.  Return to your normal activities as told by your health care provider. This information is not intended to replace advice given to you by your health care provider. Make sure you discuss any  questions you have with your health care provider. Document Revised: 03/09/2018 Document Reviewed: 02/15/2018 Elsevier Patient Education  [place].  Colonoscopy, Adult, Care After This sheet gives you information about how to care for yourself after your procedure. Your health care provider may also give you more specific instructions. If you have problems or questions, contact your health care provider. What can I expect after the procedure? After the procedure, it is common to have:  A small amount of blood in your stool for 24 hours after the procedure.  Some gas.  Mild cramping or bloating of your abdomen. Follow these  instructions at home: Eating and drinking   Drink enough fluid to keep your urine pale yellow.  Follow instructions from your health care provider about eating or drinking restrictions.  Resume your normal diet as instructed by your health care provider. Avoid heavy or fried foods that are hard to digest. Activity  Rest as told by your health care provider.  Avoid sitting for a long time without moving. Get up to take short walks every 1-2 hours. This is important to improve blood flow and breathing. Ask for help if you feel weak or unsteady.  Return to your normal activities as told by your health care provider. Ask your health care provider what activities are safe for you. Managing cramping and bloating   Try walking around when you have cramps or feel bloated.  Apply heat to your abdomen as told by your health care provider. Use the heat source that your health care provider recommends, such as a moist heat pack or a heating pad. ? Place a towel between your skin and the heat source. ? Leave the heat on for 20-30 minutes. ? Remove the heat if your skin turns bright red. This is especially important if you are unable to feel pain, heat, or cold. You may have a greater risk of getting burned. General instructions  For the first 24 hours after the procedure: ? Do not drive or use machinery. ? Do not sign important documents. ? Do not drink alcohol. ? Do your regular daily activities at a slower pace than normal. ? Eat soft foods that are easy to digest.  Take over-the-counter and prescription medicines only as told by your health care provider.  Keep all follow-up visits as told by your health care provider. This is important. Contact a health care provider if:  You have blood in your stool 2-3 days after the procedure. Get help right away if you have:  More than a small spotting of blood in your stool.  Large blood clots in your stool.  Swelling of your  abdomen.  Nausea or vomiting.  A fever.  Increasing pain in your abdomen that is not relieved with medicine. Summary  After the procedure, it is common to have a small amount of blood in your stool. You may also have mild cramping and bloating of your abdomen.  For the first 24 hours after the procedure, do not drive or use machinery, sign important documents, or drink alcohol.  Get help right away if you have a lot of blood in your stool, nausea or vomiting, a fever, or increased pain in your abdomen. This information is not intended to replace advice given to you by your health care provider. Make sure you discuss any questions you have with your health care provider. Document Revised: 04/11/2019 Document Reviewed: 04/11/2019 Elsevier Patient Education  [place] After These  instructions provide you with information about caring for yourself after your procedure. Your health care provider may also give you more specific instructions. Your treatment has been planned according to current medical practices, but problems sometimes occur. Call your health care provider if you have any problems or questions after your procedure. What can I expect after the procedure? After your procedure, you may:  Feel sleepy for several hours.  Feel clumsy and have poor balance for several hours.  Feel forgetful about what happened after the procedure.  Have poor judgment for several hours.  Feel nauseous or vomit.  Have a sore throat if you had a breathing tube during the procedure. Follow these instructions at home: For at least 24 hours after the procedure:      Have a responsible adult stay with you. It is important to have someone help care for you until you are awake and alert.  Rest as needed.  Do not: ? Participate in activities in which you could fall or become injured. ? Drive. ? Use heavy machinery. ? Drink alcohol. ? Take sleeping  pills or medicines that cause drowsiness. ? Make important decisions or sign legal documents. ? Take care of children on your own. Eating and drinking  Follow the diet that is recommended by your health care provider.  If you vomit, drink water, juice, or soup when you can drink without vomiting.  Make sure you have little or no nausea before eating solid foods. General instructions  Take over-the-counter and prescription medicines only as told by your health care provider.  If you have sleep apnea, surgery and certain medicines can increase your risk for breathing problems. Follow instructions from your health care provider about wearing your sleep device: ? Anytime you are sleeping, including during daytime naps. ? While taking prescription pain medicines, sleeping medicines, or medicines that make you drowsy.  If you smoke, do not smoke without supervision.  Keep all follow-up visits as told by your health care provider. This is important. Contact a health care provider if:  You keep feeling nauseous or you keep vomiting.  You feel light-headed.  You develop a rash.  You have a fever. Get help right away if:  You have trouble breathing. Summary  For several hours after your procedure, you may feel sleepy and have poor judgment.  Have a responsible adult stay with you for at least 24 hours or until you are awake and alert. This information is not intended to replace advice given to you by your health care provider. Make sure you discuss any questions you have with your health care provider. Document Revised: 12/14/2017 Document Reviewed: 01/06/2016 Elsevier Patient Education  [place].

## 2019-12-13 ENCOUNTER — Other Ambulatory Visit: Payer: Self-pay

## 2019-12-13 ENCOUNTER — Other Ambulatory Visit (HOSPITAL_COMMUNITY)
Admission: RE | Admit: 2019-12-13 | Discharge: 2019-12-13 | Disposition: A | Discharge status: Home / Self Care | Payer: Medicare Other | Source: Ambulatory Visit | Attending: Internal Medicine | Admitting: Internal Medicine

## 2019-12-13 ENCOUNTER — Encounter (HOSPITAL_COMMUNITY): Payer: Self-pay

## 2019-12-13 ENCOUNTER — Encounter (HOSPITAL_COMMUNITY)
Admission: RE | Admit: 2019-12-13 | Discharge: 2019-12-13 | Disposition: A | Discharge status: Home / Self Care | Payer: Medicare Other | Source: Ambulatory Visit | Attending: Internal Medicine | Admitting: Internal Medicine

## 2019-12-13 DIAGNOSIS — Z20822 Contact with and (suspected) exposure to covid-19: Secondary | ICD-10-CM | POA: Insufficient documentation

## 2019-12-13 DIAGNOSIS — Z01812 Encounter for preprocedural laboratory examination: Secondary | ICD-10-CM | POA: Insufficient documentation

## 2019-12-13 DIAGNOSIS — Z0182 Encounter for allergy testing: Secondary | ICD-10-CM | POA: Diagnosis present

## 2019-12-13 LAB — BASIC METABOLIC PANEL
Anion gap: 7 (ref 5–15)
BUN: 22 mg/dL (ref 8–23)
CO2: 27 mmol/L (ref 22–32)
Calcium: 8.8 mg/dL — ABNORMAL LOW (ref 8.9–10.3)
Chloride: 102 mmol/L (ref 98–111)
Creatinine, Ser: 0.9 mg/dL (ref 0.44–1.00)
GFR calc Af Amer: >60 mL/min (ref >60)
GFR calc non Af Amer: >60 mL/min (ref >60)
Glucose, Bld: 158 mg/dL — ABNORMAL HIGH (ref 70–99)
Potassium: 4.3 mmol/L (ref 3.5–5.1)
Sodium: 136 mmol/L (ref 135–145)

## 2019-12-13 LAB — CBC
HCT: 39.8 % (ref 36.0–46.0)
Hemoglobin: 13.2 g/dL (ref 12.0–15.0)
MCH: 31.1 pg (ref 26.0–34.0)
MCHC: 33.2 g/dL (ref 30.0–36.0)
MCV: 93.6 fL (ref 80.0–100.0)
Platelets: 150 10*3/uL (ref 150–400)
RBC: 4.25 MIL/uL (ref 3.87–5.11)
RDW: 12.1 % (ref 11.5–15.5)
WBC: 4.6 10*3/uL (ref 4.0–10.5)
nRBC: 0 % (ref 0.0–0.2)

## 2019-12-13 LAB — SARS CORONAVIRUS 2 (TAT 6-24 HRS): SARS Coronavirus 2: NEGATIVE

## 2019-12-15 ENCOUNTER — Ambulatory Visit (HOSPITAL_COMMUNITY): Payer: Medicare Other | Admitting: Anesthesiology

## 2019-12-15 ENCOUNTER — Encounter (HOSPITAL_COMMUNITY): Payer: Self-pay | Admitting: Internal Medicine

## 2019-12-15 ENCOUNTER — Ambulatory Visit (HOSPITAL_COMMUNITY)
Admission: RE | Admit: 2019-12-15 | Discharge: 2019-12-15 | Disposition: A | Discharge status: Home / Self Care | Payer: Medicare Other | Attending: Internal Medicine | Admitting: Internal Medicine

## 2019-12-15 ENCOUNTER — Encounter (HOSPITAL_COMMUNITY)
Admission: RE | Disposition: A | Discharge status: Home / Self Care | Payer: Self-pay | Source: Home / Self Care | Attending: Internal Medicine

## 2019-12-15 DIAGNOSIS — K921 Melena: Secondary | ICD-10-CM

## 2019-12-15 DIAGNOSIS — K222 Esophageal obstruction: Secondary | ICD-10-CM | POA: Diagnosis not present

## 2019-12-15 DIAGNOSIS — Z885 Allergy status to narcotic agent status: Secondary | ICD-10-CM | POA: Diagnosis not present

## 2019-12-15 DIAGNOSIS — F419 Anxiety disorder, unspecified: Secondary | ICD-10-CM | POA: Diagnosis not present

## 2019-12-15 DIAGNOSIS — I10 Essential (primary) hypertension: Secondary | ICD-10-CM | POA: Insufficient documentation

## 2019-12-15 DIAGNOSIS — K5730 Diverticulosis of large intestine without perforation or abscess without bleeding: Secondary | ICD-10-CM | POA: Diagnosis not present

## 2019-12-15 DIAGNOSIS — R1013 Epigastric pain: Secondary | ICD-10-CM

## 2019-12-15 DIAGNOSIS — Z79899 Other long term (current) drug therapy: Secondary | ICD-10-CM | POA: Diagnosis not present

## 2019-12-15 DIAGNOSIS — Z833 Family history of diabetes mellitus: Secondary | ICD-10-CM | POA: Diagnosis not present

## 2019-12-15 DIAGNOSIS — Z87891 Personal history of nicotine dependence: Secondary | ICD-10-CM | POA: Diagnosis not present

## 2019-12-15 DIAGNOSIS — K219 Gastro-esophageal reflux disease without esophagitis: Secondary | ICD-10-CM | POA: Insufficient documentation

## 2019-12-15 DIAGNOSIS — E119 Type 2 diabetes mellitus without complications: Secondary | ICD-10-CM | POA: Diagnosis not present

## 2019-12-15 DIAGNOSIS — Z888 Allergy status to other drugs, medicaments and biological substances status: Secondary | ICD-10-CM | POA: Insufficient documentation

## 2019-12-15 DIAGNOSIS — K635 Polyp of colon: Secondary | ICD-10-CM

## 2019-12-15 DIAGNOSIS — Z7951 Long term (current) use of inhaled steroids: Secondary | ICD-10-CM | POA: Diagnosis not present

## 2019-12-15 DIAGNOSIS — K449 Diaphragmatic hernia without obstruction or gangrene: Secondary | ICD-10-CM | POA: Insufficient documentation

## 2019-12-15 DIAGNOSIS — K644 Residual hemorrhoidal skin tags: Secondary | ICD-10-CM | POA: Diagnosis not present

## 2019-12-15 DIAGNOSIS — D123 Benign neoplasm of transverse colon: Secondary | ICD-10-CM | POA: Insufficient documentation

## 2019-12-15 DIAGNOSIS — J45909 Unspecified asthma, uncomplicated: Secondary | ICD-10-CM | POA: Diagnosis not present

## 2019-12-15 DIAGNOSIS — K642 Third degree hemorrhoids: Secondary | ICD-10-CM | POA: Diagnosis not present

## 2019-12-15 DIAGNOSIS — Z7982 Long term (current) use of aspirin: Secondary | ICD-10-CM | POA: Insufficient documentation

## 2019-12-15 DIAGNOSIS — K589 Irritable bowel syndrome without diarrhea: Secondary | ICD-10-CM | POA: Insufficient documentation

## 2019-12-15 HISTORY — PX: ESOPHAGOGASTRODUODENOSCOPY (EGD) WITH PROPOFOL: SHX5813

## 2019-12-15 HISTORY — PX: POLYPECTOMY: SHX5525

## 2019-12-15 HISTORY — PX: COLONOSCOPY WITH PROPOFOL: SHX5780

## 2019-12-15 LAB — GLUCOSE, CAPILLARY: Glucose-Capillary: 156 mg/dL — ABNORMAL HIGH (ref 70–99)

## 2019-12-15 SURGERY — COLONOSCOPY WITH PROPOFOL
Anesthesia: General

## 2019-12-15 MED ORDER — LACTATED RINGERS IV SOLN
Freq: Once | INTRAVENOUS | Status: AC
  Administered 2019-12-15: 1000 mL via INTRAVENOUS

## 2019-12-15 MED ORDER — LACTATED RINGERS IV SOLN: INTRAVENOUS | Status: DC | PRN

## 2019-12-15 MED ORDER — CHLORHEXIDINE GLUCONATE CLOTH 2 % EX PADS: 6.0000 | MEDICATED_PAD | Freq: Once | CUTANEOUS | Status: DC

## 2019-12-15 MED ORDER — PROPOFOL 10 MG/ML IV BOLUS
INTRAVENOUS | Status: DC | PRN
  Administered 2019-12-15: 30 mg via INTRAVENOUS
  Administered 2019-12-15: 70 mg via INTRAVENOUS

## 2019-12-15 MED ORDER — LIDOCAINE HCL (CARDIAC) PF 100 MG/5ML IV SOSY
PREFILLED_SYRINGE | INTRAVENOUS | Status: DC | PRN
  Administered 2019-12-15: 60 mg via INTRAVENOUS

## 2019-12-15 MED ORDER — PROPOFOL 10 MG/ML IV BOLUS
INTRAVENOUS | Status: AC
  Filled 2019-12-15: qty 40

## 2019-12-15 MED ORDER — PROPOFOL 500 MG/50ML IV EMUL
INTRAVENOUS | Status: DC | PRN
  Administered 2019-12-15: 150 ug/kg/min via INTRAVENOUS

## 2019-12-15 NOTE — Discharge Instructions (Signed)
Colonoscopy Discharge Instructions  Read the instructions outlined below and refer to this sheet in the next few weeks. These discharge instructions provide you with general information on caring for yourself after you leave the hospital. Your doctor may also give you specific instructions. While your treatment has been planned according to the most current medical practices available, unavoidable complications occasionally occur. If you have any problems or questions after discharge, call Dr. Gala Romney at [phone]. ACTIVITY  You may resume your regular activity, but move at a slower pace for the next 24 hours.   Take frequent rest periods for the next 24 hours.   Walking will help get rid of the air and reduce the bloated feeling in your belly (abdomen).   No driving for 24 hours (because of the medicine (anesthesia) used during the test).    Do not sign any important legal documents or operate any machinery for 24 hours (because of the anesthesia used during the test).  NUTRITION  Drink plenty of fluids.   You may resume your normal diet as instructed by your doctor.   Begin with a light meal and progress to your normal diet. Heavy or fried foods are harder to digest and may make you feel sick to your stomach (nauseated).   Avoid alcoholic beverages for 24 hours or as instructed.  MEDICATIONS  You may resume your normal medications unless your doctor tells you otherwise.  WHAT YOU CAN EXPECT TODAY  Some feelings of bloating in the abdomen.   Passage of more gas than usual.   Spotting of blood in your stool or on the toilet paper.  IF YOU HAD POLYPS REMOVED DURING THE COLONOSCOPY:  No aspirin products for 7 days or as instructed.   No alcohol for 7 days or as instructed.   Eat a soft diet for the next 24 hours.  FINDING OUT THE RESULTS OF YOUR TEST Not all test results are available during your visit. If your test results are not back during the visit, make an appointment  with your caregiver to find out the results. Do not assume everything is normal if you have not heard from your caregiver or the medical facility. It is important for you to follow up on all of your test results.  SEEK IMMEDIATE MEDICAL ATTENTION IF:  You have more than a spotting of blood in your stool.   Your belly is swollen (abdominal distention).   You are nauseated or vomiting.   You have a temperature over 101.   You have abdominal pain or discomfort that is severe or gets worse throughout the day.  EGD Discharge instructions Please read the instructions outlined below and refer to this sheet in the next few weeks. These discharge instructions provide you with general information on caring for yourself after you leave the hospital. Your doctor may also give you specific instructions. While your treatment has been planned according to the most current medical practices available, unavoidable complications occasionally occur. If you have any problems or questions after discharge, please call your doctor. ACTIVITY  You may resume your regular activity but move at a slower pace for the next 24 hours.   Take frequent rest periods for the next 24 hours.   Walking will help expel (get rid of) the air and reduce the bloated feeling in your abdomen.   No driving for 24 hours (because of the anesthesia (medicine) used during the test).   You may shower.   Do not sign any important  legal documents or operate any machinery for 24 hours (because of the anesthesia used during the test).  NUTRITION  Drink plenty of fluids.   You may resume your normal diet.   Begin with a light meal and progress to your normal diet.   Avoid alcoholic beverages for 24 hours or as instructed by your caregiver.  MEDICATIONS  You may resume your normal medications unless your caregiver tells you otherwise.  WHAT YOU CAN EXPECT TODAY  You may experience abdominal discomfort such as a feeling of fullness  or "gas" pains.  FOLLOW-UP  Your doctor will discuss the results of your test with you.  SEEK IMMEDIATE MEDICAL ATTENTION IF ANY OF THE FOLLOWING OCCUR:  Excessive nausea (feeling sick to your stomach) and/or vomiting.   Severe abdominal pain and distention (swelling).   Trouble swallowing.   Temperature over 101 F (37.8 C).   Rectal bleeding or vomiting of blood.   Gastroesophageal Reflux Disease, Adult Gastroesophageal reflux (GER) happens when acid from the stomach flows up into the tube that connects the mouth and the stomach (esophagus). Normally, food travels down the esophagus and stays in the stomach to be digested. However, when a person has GER, food and stomach acid sometimes move back up into the esophagus. If this becomes a more serious problem, the person may be diagnosed with a disease called gastroesophageal reflux disease (GERD). GERD occurs when the reflux:  Happens often.  Causes frequent or severe symptoms.  Causes problems such as damage to the esophagus. When stomach acid comes in contact with the esophagus, the acid may cause soreness (inflammation) in the esophagus. Over time, GERD may create small holes (ulcers) in the lining of the esophagus. What are the causes? This condition is caused by a problem with the muscle between the esophagus and the stomach (lower esophageal sphincter, or LES). Normally, the LES muscle closes after food passes through the esophagus to the stomach. When the LES is weakened or abnormal, it does not close properly, and that allows food and stomach acid to go back up into the esophagus. The LES can be weakened by certain dietary substances, medicines, and medical conditions, including:  Tobacco use.  Pregnancy.  Having a hiatal hernia.  Alcohol use.  Certain foods and beverages, such as coffee, chocolate, onions, and peppermint. What increases the risk? You are more likely to develop this condition if you:  Have an  increased body weight.  Have a connective tissue disorder.  Use NSAID medicines. What are the signs or symptoms? Symptoms of this condition include:  Heartburn.  Difficult or painful swallowing.  The feeling of having a lump in the throat.  Abitter taste in the mouth.  Bad breath.  Having a large amount of saliva.  Having an upset or bloated stomach.  Belching.  Chest pain. Different conditions can cause chest pain. Make sure you see your health care provider if you experience chest pain.  Shortness of breath or wheezing.  Ongoing (chronic) cough or a night-time cough.  Wearing away of tooth enamel.  Weight loss. How is this diagnosed? Your health care provider will take a medical history and perform a physical exam. To determine if you have mild or severe GERD, your health care provider may also monitor how you respond to treatment. You may also have tests, including:  A test to examine your stomach and esophagus with a small camera (endoscopy).  A test thatmeasures the acidity level in your esophagus.  A test thatmeasures how  much pressure is on your esophagus.  A barium swallow or modified barium swallow test to show the shape, size, and functioning of your esophagus. How is this treated? The goal of treatment is to help relieve your symptoms and to prevent complications. Treatment for this condition may vary depending on how severe your symptoms are. Your health care provider may recommend:  Changes to your diet.  Medicine.  Surgery. Follow these instructions at home: Eating and drinking   Follow a diet as recommended by your health care provider. This may involve avoiding foods and drinks such as: ? Coffee and tea (with or without caffeine). ? Drinks that containalcohol. ? Energy drinks and sports drinks. ? Carbonated drinks or sodas. ? Chocolate and cocoa. ? Peppermint and mint flavorings. ? Garlic and onions. ? Horseradish. ? Spicy and acidic  foods, including peppers, chili powder, curry powder, vinegar, hot sauces, and barbecue sauce. ? Citrus fruit juices and citrus fruits, such as oranges, lemons, and limes. ? Tomato-based foods, such as red sauce, chili, salsa, and pizza with red sauce. ? Fried and fatty foods, such as donuts, french fries, potato chips, and high-fat dressings. ? High-fat meats, such as hot dogs and fatty cuts of red and white meats, such as rib eye steak, sausage, ham, and bacon. ? High-fat dairy items, such as whole milk, butter, and cream cheese.  Eat small, frequent meals instead of large meals.  Avoid drinking large amounts of liquid with your meals.  Avoid eating meals during the 2-3 hours before bedtime.  Avoid lying down right after you eat.  Do not exercise right after you eat. Lifestyle   Do not use any products that contain nicotine or tobacco, such as cigarettes, e-cigarettes, and chewing tobacco. If you need help quitting, ask your health care provider.  Try to reduce your stress by using methods such as yoga or meditation. If you need help reducing stress, ask your health care provider.  If you are overweight, reduce your weight to an amount that is healthy for you. Ask your health care provider for guidance about a safe weight loss goal. General instructions  Pay attention to any changes in your symptoms.  Take over-the-counter and prescription medicines only as told by your health care provider. Do not take aspirin, ibuprofen, or other NSAIDs unless your health care provider told you to do so.  Wear loose-fitting clothing. Do not wear anything tight around your waist that causes pressure on your abdomen.  Raise (elevate) the head of your bed about 6 inches (15 cm).  Avoid bending over if this makes your symptoms worse.  Keep all follow-up visits as told by your health care provider. This is important. Contact a health care provider if:  You have: ? New symptoms. ? Unexplained  weight loss. ? Difficulty swallowing or it hurts to swallow. ? Wheezing or a persistent cough. ? A hoarse voice.  Your symptoms do not improve with treatment. Get help right away if you:  Have pain in your arms, neck, jaw, teeth, or back.  Feel sweaty, dizzy, or light-headed.  Have chest pain or shortness of breath.  Vomit and your vomit looks like blood or coffee grounds.  Faint.  Have stool that is bloody or black.  Cannot swallow, drink, or eat. Summary  Gastroesophageal reflux happens when acid from the stomach flows up into the esophagus. GERD is a disease in which the reflux happens often, causes frequent or severe symptoms, or causes problems such as damage to  the esophagus.  Treatment for this condition may vary depending on how severe your symptoms are. Your health care provider may recommend diet and lifestyle changes, medicine, or surgery.  Contact a health care provider if you have new or worsening symptoms.  Take over-the-counter and prescription medicines only as told by your health care provider. Do not take aspirin, ibuprofen, or other NSAIDs unless your health care provider told you to do so.  Keep all follow-up visits as told by your health care provider. This is important. This information is not intended to replace advice given to you by your health care provider. Make sure you discuss any questions you have with your health care provider. Document Revised: 03/24/2018 Document Reviewed: 03/24/2018 Elsevier Patient Education  [place].  Colon Polyps  Polyps are tissue growths inside the body. Polyps can grow in many places, including the large intestine (colon). A polyp may be a round bump or a mushroom-shaped growth. You could have one polyp or several. Most colon polyps are noncancerous (benign). However, some colon polyps can become cancerous over time. Finding and removing the polyps early can help prevent this. What are the causes? The exact  cause of colon polyps is not known. What increases the risk? You are more likely to develop this condition if you:  Have a family history of colon cancer or colon polyps.  Are older than 47 or older than 45 if you are African American.  Have inflammatory bowel disease, such as ulcerative colitis or Crohn's disease.  Have certain hereditary conditions, such as: ? Familial adenomatous polyposis. ? Lynch syndrome. ? Turcot syndrome. ? Peutz-Jeghers syndrome.  Are overweight.  Smoke cigarettes.  Do not get enough exercise.  Drink too much alcohol.  Eat a diet that is high in fat and red meat and low in fiber.  Had childhood cancer that was treated with abdominal radiation. What are the signs or symptoms? Most polyps do not cause symptoms. If you have symptoms, they may include:  Blood coming from your rectum when having a bowel movement.  Blood in your stool. The stool may look dark red or black.  Abdominal pain.  A change in bowel habits, such as constipation or diarrhea. How is this diagnosed? This condition is diagnosed with a colonoscopy. This is a procedure in which a lighted, flexible scope is inserted into the anus and then passed into the colon to examine the area. Polyps are sometimes found when a colonoscopy is done as part of routine cancer screening tests. How is this treated? Treatment for this condition involves removing any polyps that are found. Most polyps can be removed during a colonoscopy. Those polyps will then be tested for cancer. Additional treatment may be needed depending on the results of testing. Follow these instructions at home: Lifestyle  Maintain a healthy weight, or lose weight if recommended by your health care provider.  Exercise every day or as told by your health care provider.  Do not use any products that contain nicotine or tobacco, such as cigarettes and e-cigarettes. If you need help quitting, ask your health care provider.  If  you drink alcohol, limit how much you have: ? 0-1 drink a day for women. ? 0-2 drinks a day for men.  Be aware of how much alcohol is in your drink. In the [place] one drink equals one 12 oz bottle of beer (355 mL), one 5 oz glass of wine (148 mL), or one 1 oz shot of hard liquor (  44 mL). Eating and drinking   Eat foods that are high in fiber, such as fruits, vegetables, and whole grains.  Eat foods that are high in calcium and vitamin D, such as milk, cheese, yogurt, eggs, liver, fish, and broccoli.  Limit foods that are high in fat, such as fried foods and desserts.  Limit the amount of red meat and processed meat you eat, such as hot dogs, sausage, bacon, and lunch meats. General instructions  Keep all follow-up visits as told by your health care provider. This is important. ? This includes having regularly scheduled colonoscopies. ? Talk to your health care provider about when you need a colonoscopy. Contact a health care provider if:  You have new or worsening bleeding during a bowel movement.  You have new or increased blood in your stool.  You have a change in bowel habits.  You lose weight for no known reason. Summary  Polyps are tissue growths inside the body. Polyps can grow in many places, including the colon.  Most colon polyps are noncancerous (benign), but some can become cancerous over time.  This condition is diagnosed with a colonoscopy.  Treatment for this condition involves removing any polyps that are found. Most polyps can be removed during a colonoscopy. This information is not intended to replace advice given to you by your health care provider. Make sure you discuss any questions you have with your health care provider. Document Revised: 12/31/2017 Document Reviewed: 12/31/2017 Elsevier Patient Education  [place].   Nonsurgical Procedures for Hemorrhoids  Nonsurgical procedures can be used to treat hemorrhoids. Hemorrhoids are swollen  veins that are inside the rectum (internal hemorrhoids) or around the anus (external hemorrhoids). They are caused by increased pressure in the anal area. This pressure may result from straining to have a bowel movement (constipation), diarrhea, pregnancy, obesity, anal sex, or sitting for long periods of time. Hemorrhoids can cause symptoms such as pain and bleeding. Various procedures may be done if diet changes, lifestyle changes, and other home treatments do not help your symptoms. Some of these procedures do not involve surgery. Tell a health care provider about:  Any allergies you have.  All medicines you are taking, including vitamins, herbs, eye drops, creams, and over-the-counter medicines.  Any problems you or family members have had with anesthetic medicines.  Any blood disorders you have.  Any surgeries you have had.  Any medical conditions you have.  Whether you are pregnant or may be pregnant. What are the risks? Generally, this is a safe procedure. However, problems may occur, including:  Infection.  Bleeding.  Pain. What happens before the procedure?  Ask your health care provider about: ? Changing or stopping your regular medicines. This is especially important if you are taking diabetes medicines or blood thinners. ? Taking medicines such as aspirin and ibuprofen. These medicines can thin your blood. Do not take these medicines unless your health care provider tells you to take them. ? Taking over-the-counter medicines, vitamins, herbs, and supplements.  Follow instructions from your health care provider about eating or drinking restrictions.  You may need to have a procedure to examine the inside of your colon with a scope (colonoscopy). Your health care provider may do this to make sure that there are no other causes for your bleeding or pain. What happens during the procedure?   Your health care provider will clean your rectal area with a rinsing  solution.  A lubricating jelly may be placed into your  rectum. The jelly may contain a medicine to numb the area (local anesthetic).  Your health care provider will insert a short scope (anoscope) into your rectum to examine the hemorrhoids.  One of the following techniques will be used: ? Rubber band ligation. Your health care provider will place medical instruments through the scope to put rubber bands around the base of your hemorrhoids. The bands will cut off the blood supply to the hemorrhoids. The hemorrhoids will fall off after several days. Each of these procedures may vary among health care providers and hospitals. What happens after the procedure?  You will be monitored to make sure that you have no bleeding.  Return to your normal activities as told by your health care provider. Summary  Hemorrhoids are swollen veins that are inside the rectum (internal hemorrhoids) or around the anus (external hemorrhoids).  Nonsurgical procedures can be used to treat hemorrhoids.  Rubber band ligation, sclerotherapy, or infrared coagulation may be used if dietary and lifestyle changes do not cause your hemorrhoids to go away.  Before the procedure, ask your health care provider about changing or stopping your regular medicines. This information is not intended to replace advice given to you by your health care provider. Make sure you discuss any questions you have with your health care provider. Document Revised: 02/23/2019 Document Reviewed: 03/08/2018 Elsevier Patient Education  [place].  GERD, colon polyp and diverticulosis information provided  Hemorrhoid banding pamphlet provided  Continue Protonix 40 mg daily.  Begin Benefiber 1 tablespoon daily for 3 weeks; then increase to 2 tablespoons daily thereafter.  Further recommendations to follow pending review of pathology report  Appointment with [place] in 3 months (AB) for hemorrhoid banding.  At patient request I called  Shazia Wehrly at [phone] -reviewed results.

## 2019-12-15 NOTE — Transfer of Care (Signed)
Immediate Anesthesia Transfer of Care Note  Patient: Caitlin Tanner  Procedure(s) Performed: COLONOSCOPY WITH PROPOFOL (N/A ) ESOPHAGOGASTRODUODENOSCOPY (EGD) WITH PROPOFOL (N/A ) POLYPECTOMY  Patient Location: PACU  Anesthesia Type:General  Level of Consciousness: awake, alert, oriented  Airway & Oxygen Therapy: Patient Spontanous Breathing  Post-op Assessment: Report given to RN and Post -op Vital signs reviewed and stable  Post vital signs: Reviewed and stable  Last Vitals:  Vitals Value Taken Time  BP    Temp    Pulse    Resp 21 12/15/19 1353  SpO2    Vitals shown include unvalidated device data.  Last Pain:  Vitals:   12/15/19 1313  TempSrc:   PainSc: 0-No pain      Patients Stated Pain Goal: 8 ([id number])  Complications: No apparent anesthesia complications

## 2019-12-15 NOTE — Anesthesia Preprocedure Evaluation (Signed)
Anesthesia Evaluation  Patient identified by MRN, date of birth, ID band Patient awake    Reviewed: Allergy & Precautions, NPO status , Patient's Chart, lab work & pertinent test results, reviewed documented beta blocker date and time   History of Anesthesia Complications Negative for: history of anesthetic complications  Airway Mallampati: II  TM Distance: >3 FB Neck ROM: Full    Dental  (+) Teeth Intact, Dental Advisory Given   Pulmonary asthma , former smoker,    Pulmonary exam normal breath sounds clear to auscultation       Cardiovascular Exercise Tolerance: Good hypertension, Pt. on home beta blockers and Pt. on medications  Rhythm:Regular Rate:Normal     Neuro/Psych PSYCHIATRIC DISORDERS Anxiety    GI/Hepatic Neg liver ROS, Bowel prep,GERD  Medicated and Controlled,  Endo/Other  diabetes, Well Controlled, Type 2  Renal/GU negative Renal ROS     Musculoskeletal   Abdominal   Peds  Hematology   Anesthesia Other Findings   Reproductive/Obstetrics                            Anesthesia Physical Anesthesia Plan  ASA: II  Anesthesia Plan: General   Post-op Pain Management:    Induction: Intravenous  PONV Risk Score and Plan: 2 and Propofol infusion and Treatment may vary due to age or medical condition  Airway Management Planned: Nasal Cannula, Natural Airway and Simple Face Mask  Additional Equipment:   Intra-op Plan:   Post-operative Plan:   Informed Consent: I have reviewed the patients History and Physical, chart, labs and discussed the procedure including the risks, benefits and alternatives for the proposed anesthesia with the patient or authorized representative who has indicated his/her understanding and acceptance.     Dental advisory given  Plan Discussed with: CRNA  Anesthesia Plan Comments:         Anesthesia Quick Evaluation

## 2019-12-15 NOTE — Anesthesia Postprocedure Evaluation (Signed)
Anesthesia Post Note  Patient: Caitlin Tanner  Procedure(s) Performed: COLONOSCOPY WITH PROPOFOL (N/A ) ESOPHAGOGASTRODUODENOSCOPY (EGD) WITH PROPOFOL (N/A ) POLYPECTOMY  Patient location during evaluation: PACU Anesthesia Type: General Level of consciousness: awake and alert, awake and patient cooperative Pain management: pain level controlled Vital Signs Assessment: post-procedure vital signs reviewed and stable Respiratory status: spontaneous breathing, respiratory function stable and nonlabored ventilation Postop Assessment: no apparent nausea or vomiting Anesthetic complications: no     Last Vitals:  Vitals:   12/15/19 1104  BP: (!) 145/82  Pulse: 90  Resp: 17  Temp: 37.3 C  SpO2: 95%    Last Pain:  Vitals:   12/15/19 1313  TempSrc:   PainSc: 0-No pain                 Alejandra Hunt

## 2019-12-15 NOTE — H&P (Signed)
_0 @   Primary Care Physician:  Thea Alken Primary Gastroenterologist:  Dr. Gala Romney  Pre-Procedure History & Physical: HPI:  Caitlin Tanner is a 69 y.o. female here for further evaluation of dyspepsia/GERD.  Symptoms better on Protonix 40 mg once a day.  No dysphagia.  Rectal bleeding has improved as well.  Has a known protruding hemorrhoid.  Past Medical History:  Diagnosis Date  . Anxiety disorder   . Asthma   . Borderline diabetes   . Diverticulosis   . GERD (gastroesophageal reflux disease)   . Hemorrhoids   . Hypertension   . IBS (irritable bowel syndrome)   . S/P colonoscopy 12/23/10   Dr Ruth Tully-diverticulosis, anal canal hemorhhoids, anal papilla, random bx benign  . S/P endoscopy 12/23/10   Dr Girard Cooter, tubular esophagus, sm HH, minimal retained food, bile-stained mucus, otherwise normal,, esophageal bx  benign    Past Surgical History:  Procedure Laterality Date  . CHOLECYSTECTOMY    . cold knife conization    . COLONOSCOPY  06/08/2006   internal hemorrhoids otherwise noraml colon and rectum  . COLONOSCOPY  11/2010   Dr. Gala Romney: Anal canal hemorrhoids, diverticulosis, random colon biopsies unremarkable.  . egd/tcs  11/2010   see PMH  . ESOPHAGOGASTRODUODENOSCOPY  06/08/2006   single distal erosion otherwise normal  . ESOPHAGOGASTRODUODENOSCOPY  11/2010   Dr. Gala Romney: Longitudinal for reading of the tubular esophagus, no evidence of eosinophilic esophagitis on biopsy, small hiatal hernia, minimal amount of retained food, bile-stained mucosa.  . left great toe surgery    . TONSILLECTOMY    . WRIST FRACTURE SURGERY  07/2013   right    Prior to Admission medications   Medication Sig Start Date End Date Taking? Authorizing Provider  aspirin 81 MG tablet Take 81 mg by mouth daily.     Yes [provider]  Azelastine-Fluticasone (DYMISTA) 137-50 MCG/ACT SUSP Use one spray in each nostril one to two times a day as directed. 11/24/19  Yes Valentina Shaggy,  MD  bismuth subsalicylate (PEPTO BISMOL) 262 MG/15ML suspension Take 30 mLs by mouth daily as needed for indigestion or diarrhea or loose stools.    Yes [provider]  calcium carbonate (TUMS - DOSED IN MG ELEMENTAL CALCIUM) 500 MG chewable tablet Chew 1 tablet by mouth as needed for indigestion or heartburn.   Yes [provider]  Cholecalciferol (VITAMIN D3) 25 MCG (1000 UT) CAPS Take 2,000 Units by mouth daily.    Yes [provider]  Cranberry-Vitamin C-Probiotic (AZO CRANBERRY PO) Take 1 tablet by mouth daily as needed (UTI).    Yes [provider]  escitalopram (LEXAPRO) 10 MG tablet Take 10 mg by mouth daily.    Yes [provider]  ezetimibe (ZETIA) 10 MG tablet Take 10 mg by mouth daily.     Yes [provider]  fluticasone-salmeterol (ADVAIR HFA) 230-21 MCG/ACT inhaler Inhale 2 puffs into the lungs 2 (two) times daily. 11/24/19  Yes Valentina Shaggy, MD  loratadine (CLARITIN) 10 MG tablet Take 1 tablet (10 mg total) by mouth daily as needed for allergies. 11/24/19  Yes Valentina Shaggy, MD  LORazepam (ATIVAN) 0.5 MG tablet Take 0.5 mg by mouth daily as needed for anxiety.    Yes [provider]  losartan-hydrochlorothiazide (HYZAAR) 50-12.5 MG per tablet Take 1 tablet by mouth daily.     Yes [provider]  metoprolol (TOPROL-XL) 50 MG 24 hr tablet Take 50 mg by mouth daily.  Yes [provider]  multivitamin [facility]) per tablet Take 1 tablet by mouth daily.     Yes [provider]  Na Sulfate-K Sulfate-Mg Sulf (SUPREP BOWEL PREP KIT) 17.5-3.13-1.6 GM/177ML SOLN Take 1 kit by mouth as directed. 07/05/19  Yes Aldric Wenzler, Cristopher Estimable, MD  Omega-3 Fatty Acids (FISH OIL) 1000 MG CAPS Take 2,000 mg by mouth daily.    Yes [provider]  pantoprazole (PROTONIX) 40 MG tablet Take 1 tablet (40 mg total) by mouth daily. Please specify directions, refills and quantity 07/06/19  Yes Carlis Stable,  NP  albuterol (PROAIR HFA) 108 (90 Base) MCG/ACT inhaler Use 2 puffs every 4 hours as needed for cough or wheeze.  May use 2 puffs 10-20 minutes prior to exercise. 11/24/19   Valentina Shaggy, MD  hydrocortisone (ANUSOL-HC) 2.5 % rectal cream Place 1 application rectally 2 (two) times daily. 06/09/19   Annitta Needs, NP  predniSONE (DELTASONE) 10 MG tablet Take two tablets (5m) twice daily for three days, then one tablet (150m twice daily for three days, then STOP. 11/24/19   GaValentina ShaggyMD    Allergies as of 07/06/2019 - Review Complete 06/09/2019  Allergen Reaction Noted  . Conj estrog-medroxyprogest ace    . Ketorolac tromethamine    . Tramadol hcl      Family History  Problem Relation Age of Onset  . Stomach cancer Father 7851. Throat cancer Mother 5529. Diabetes Brother   . Transient ischemic attack Brother   . Colon cancer Neg Hx   . Allergic rhinitis Neg Hx   . Angioedema Neg Hx   . Asthma Neg Hx   . Atopy Neg Hx   . Eczema Neg Hx   . Immunodeficiency Neg Hx   . Urticaria Neg Hx     Social History   Socioeconomic History  . Marital status: Married    Spouse name: Not on file  . Number of children: 1  . Years of education: Not on file  . Highest education level: Not on file  Occupational History    Employer: UNEMPLOYED  Tobacco Use  . Smoking status: Former Smoker    Packs/day: 0.10    Years: 3.00    Pack years: 0.30    Types: Cigarettes    Quit date: 09/30/1987    Years since quitting: 32.2  . Smokeless tobacco: Never Used  . Tobacco comment: pt states she smoked socially  Substance and Sexual Activity  . Alcohol use: No  . Drug use: No  . Sexual activity: Not on file  Other Topics Concern  . Not on file  Social History Narrative  . Not on file   Social Determinants of Health   Financial Resource Strain:   . Difficulty of Paying Living Expenses:   Food Insecurity:   . Worried About RuCharity fundraisern the Last Year:   . RaYouth workern the Last Year:   Transportation Needs:   . LaFilm/video editorMedical):   . Marland Kitchenack of Transportation (Non-Medical):   Physical Activity:   . Days of Exercise per Week:   . Minutes of Exercise per Session:   Stress:   . Feeling of Stress :   Social Connections:   . Frequency of Communication with Friends and Family:   . Frequency of Social Gatherings with Friends and Family:   . Attends Religious Services:   . Active Member of Clubs or Organizations:   .  Attends Archivist Meetings:   Marland Kitchen Marital Status:   Intimate Partner Violence:   . Fear of Current or Ex-Partner:   . Emotionally Abused:   Marland Kitchen Physically Abused:   . Sexually Abused:     Review of Systems: See HPI, otherwise negative ROS  Physical Exam: BP (!) 145/82   Pulse 90   Temp 99.1 F (37.3 C) (Oral)   Resp 17   SpO2 95%  General:   Alert,  Well-developed, well-nourished, pleasant and cooperative in NAD Mouth:  No deformity or lesions. Neck:  Supple; no masses or thyromegaly. No significant cervical adenopathy. Lungs:  Clear throughout to auscultation.   No wheezes, crackles, or rhonchi. No acute distress. Heart:  Regular rate and rhythm; no murmurs, clicks, rubs,  or gallops. Abdomen: Non-distended, normal bowel sounds.  Soft and nontender without appreciable mass or hepatosplenomegaly.  Pulses:  Normal pulses noted. Extremities:  Without clubbing or edema.  Impression/Plan: 69 year old lady with dyspepsia/GERD better on Protonix 40 mg daily.  No alarm symptoms currently.  Intermittent paper hematochezia.  Here for colonoscopy as well. The risks, benefits, limitations, imponderables and alternatives regarding both EGD and colonoscopy have been reviewed with the patient. Questions have been answered. All parties agreeable.      Notice: This dictation was prepared with Dragon dictation along with smaller phrase technology. Any transcriptional errors that result from this process are  unintentional and may not be corrected upon review.

## 2019-12-15 NOTE — Op Note (Signed)
[facility] Patient Name: Caitlin Tanner Procedure Date: 12/15/2019 1:05 PM MRN: [id number] Date of Birth: November 12, 1950 Attending MD: Norvel Richards , MD CSN: [id number] Age: 69 Admit Type: Outpatient Procedure:                Upper GI endoscopy Indications:              Dyspepsia (much improved with switch to Protonix 40                            mg daily) Providers:                Norvel Richards, MD, Otis Peak B. Sharon Seller, RN,                            Randa Spike, Technician Referring MD:              Medicines:                Propofol per Anesthesia Complications:            No immediate complications. Estimated Blood Loss:     Estimated blood loss: none. Procedure:                Pre-Anesthesia Assessment:                           - Prior to the procedure, a History and Physical                            was performed, and patient medications and                            allergies were reviewed. The patient's tolerance of                            previous anesthesia was also reviewed. The risks                            and benefits of the procedure and the sedation                            options and risks were discussed with the patient.                            All questions were answered, and informed consent                            was obtained. ASA Grade Assessment: II - A patient                            with mild systemic disease. After reviewing the                            risks and benefits, the patient was deemed in  satisfactory condition to undergo the procedure.                           After obtaining informed consent, the endoscope was                            passed under direct vision. Throughout the                            procedure, the patient's blood pressure, pulse, and                            oxygen saturations were monitored continuously. The                            [id number]  [id number] scope was introduced through the                            mouth, and advanced to the second part of duodenum.                            The upper GI endoscopy was accomplished without                            difficulty. The patient tolerated the procedure                            well. Scope In: 1:20:25 PM Scope Out: 1:23:54 PM Total Procedure Duration: 0 hours 3 minutes 29 seconds  Findings:      A mild Schatzki ring was found at the gastroesophageal junction.       Nonobstructing. Patient denies dysphagia.      A small hiatal hernia was present.      The stomach was otherwise without abnormality.      The duodenal bulb and second portion of the duodenum were normal. Impression:               - Mild Schatzki ring.                           - Small hiatal hernia.                           - The examination was otherwise normal.                           - Normal duodenal bulb and second portion of the                            duodenum.                           - No specimens collected. Moderate Sedation:      Moderate (conscious) sedation was personally administered by an       anesthesia professional. The following parameters were monitored: oxygen       saturation, heart rate, blood  pressure, respiratory rate, EKG, adequacy       of pulmonary ventilation, and response to care. Recommendation:           - Patient has a contact number available for                            emergencies. The signs and symptoms of potential                            delayed complications were discussed with the                            patient. Return to normal activities tomorrow.                            Written discharge instructions were provided to the                            patient.                           - Advance diet as tolerated.                           - Continue present medications. Continue Protonix                            40 mg daily                            - Return to GI clinic in 3 months. See colonoscopy                            report. Procedure Code(s):        --- Professional ---                           [phone], Esophagogastroduodenoscopy, flexible,                            transoral; diagnostic, including collection of                            specimen(s) by brushing or washing, when performed                            (separate procedure) Diagnosis Code(s):        --- Professional ---                           K22.2, Esophageal obstruction                           K44.9, Diaphragmatic hernia without obstruction or                            gangrene  R10.13, Epigastric pain CPT copyright 2019 American Medical Association. All rights reserved. The codes documented in this report are preliminary and upon coder review may  be revised to meet current compliance requirements. Cristopher Estimable. Zyria Fiscus, MD Norvel Richards, MD 12/15/2019 1:30:03 PM This report has been signed electronically. Number of Addenda: 0

## 2019-12-15 NOTE — Op Note (Signed)
[facility] Patient Name: Caitlin Tanner Procedure Date: 12/15/2019 1:30 PM MRN: [id number] Date of Birth: 1950-12-09 Attending MD: Norvel Richards , MD CSN: [id number] Age: 69 Admit Type: Outpatient Procedure:                Colonoscopy Indications:              Hematochezia Providers:                Norvel Richards, MD, Gwenlyn Fudge RN, RN,                            Randa Spike, Technician Referring MD:              Medicines:                Propofol per Anesthesia Complications:            No immediate complications. Estimated Blood Loss:     Estimated blood loss was minimal. Procedure:                Pre-Anesthesia Assessment:                           - Prior to the procedure, a History and Physical                            was performed, and patient medications and                            allergies were reviewed. The patient's tolerance of                            previous anesthesia was also reviewed. The risks                            and benefits of the procedure and the sedation                            options and risks were discussed with the patient.                            All questions were answered, and informed consent                            was obtained. ASA Grade Assessment: II - A patient                            with mild systemic disease. After reviewing the                            risks and benefits, the patient was deemed in                            satisfactory condition to undergo the procedure.  After obtaining informed consent, the colonoscope                            was passed under direct vision. Throughout the                            procedure, the patient's blood pressure, pulse, and                            oxygen saturations were monitored continuously. The                            [id number] scope was introduced through                            the anus and  advanced to the the cecum, identified                            by appendiceal orifice and ileocecal valve. The                            colonoscopy was performed without difficulty. The                            patient tolerated the procedure well. The quality                            of the bowel preparation was adequate. Scope In: 1:31:29 PM Scope Out: 1:45:23 PM Scope Withdrawal Time: 0 hours 11 minutes 5 seconds  Total Procedure Duration: 0 hours 13 minutes 54 seconds  Findings:      The perianal and digital rectal examinations were normal.      Scattered medium-mouthed diverticula were found in the entire colon.      Three sessile polyps were found in the hepatic flexure. The polyps were       4 to 5 mm in size. These polyps were removed with a cold snare.       Resection and retrieval were complete. Estimated blood loss was minimal.      Non-bleeding external and internal hemorrhoids were found during       retroflexion. The hemorrhoids were Grade III (internal hemorrhoids that       prolapse but require manual reduction).      The exam was otherwise without abnormality on direct and retroflexion       views. Impression:               - Diverticulosis in the entire examined colon.                           - Three 4 to 5 mm polyps at the hepatic flexure,                            removed with a cold snare. Resected and retrieved.                           -  Non-bleeding external and internal hemorrhoids.                           - The examination was otherwise normal on direct                            and retroflexion views. Hemorrhoids are amenable to                            band ligation Moderate Sedation:      Moderate (conscious) sedation was personally administered by an       anesthesia professional. The following parameters were monitored: oxygen       saturation, heart rate, blood pressure, respiratory rate, EKG, adequacy       of pulmonary ventilation, and  response to care. Recommendation:           - Patient has a contact number available for                            emergencies. The signs and symptoms of potential                            delayed complications were discussed with the                            patient. Return to normal activities tomorrow.                            Written discharge instructions were provided to the                            patient.                           - Resume previous diet.                           - Continue present medications. Begin Benefiber 1                            tablespoon daily for 3 weeks and increase to twice                            daily thereafter.                           - Repeat colonoscopy date to be determined after                            pending pathology results are reviewed for                            surveillance based on pathology results.                           - Return to GI office in  3 months. Pamphlet on                            hemorrhoid banding. Follow-up appointment for                            banding Procedure Code(s):        --- Professional ---                           [phone], Colonoscopy, flexible; with removal of                            tumor(s), polyp(s), or other lesion(s) by snare                            technique Diagnosis Code(s):        --- Professional ---                           K64.2, Third degree hemorrhoids                           K63.5, Polyp of colon                           K92.1, Melena (includes Hematochezia)                           K57.30, Diverticulosis of large intestine without                            perforation or abscess without bleeding CPT copyright 2019 American Medical Association. All rights reserved. The codes documented in this report are preliminary and upon coder review may  be revised to meet current compliance requirements. Cristopher Estimable. Dwaine Pringle, MD Norvel Richards, MD 12/15/2019  1:49:51 PM This report has been signed electronically. Number of Addenda: 0

## 2019-12-16 ENCOUNTER — Encounter: Payer: Self-pay | Admitting: Internal Medicine

## 2019-12-16 LAB — SURGICAL PATHOLOGY

## 2020-01-18 ENCOUNTER — Other Ambulatory Visit: Payer: Self-pay | Admitting: Nurse Practitioner

## 2020-02-24 ENCOUNTER — Ambulatory Visit: Payer: Self-pay | Admitting: Allergy & Immunology

## 2020-03-02 ENCOUNTER — Encounter: Payer: Self-pay | Admitting: Allergy & Immunology

## 2020-03-02 ENCOUNTER — Ambulatory Visit (INDEPENDENT_AMBULATORY_CARE_PROVIDER_SITE_OTHER): Payer: Medicare Other | Admitting: Family

## 2020-03-02 ENCOUNTER — Other Ambulatory Visit: Payer: Self-pay

## 2020-03-02 VITALS — BP 150/90 | HR 65 | Resp 16

## 2020-03-02 DIAGNOSIS — J310 Chronic rhinitis: Secondary | ICD-10-CM | POA: Diagnosis not present

## 2020-03-02 DIAGNOSIS — J4541 Moderate persistent asthma with (acute) exacerbation: Secondary | ICD-10-CM

## 2020-03-02 DIAGNOSIS — K219 Gastro-esophageal reflux disease without esophagitis: Secondary | ICD-10-CM

## 2020-03-02 NOTE — Patient Instructions (Addendum)
Asthma Start prednisone 10 mg taper. Take 2 tablets twice a day for three days, then on the 4th day take 2 tablets in the morning and then on the 5th day take one tablet and stop. Continue Advair HFA 230/21, but use 2 puffs twice a day every day with spacer to help prevent cough and wheeze. For asthma flareups may use Qvar 80 mcg 2 puffs twice a day for 1 to 2 weeks. May use albuterol 2 puffs every 4 hours as needed for coughing, wheezing, tightness in chest or shortness of breath.  Also may use albuterol 2 puffs 5 to 15 minutes prior to exercise. Asthma control goals:   Full participation in all desired activities (may need albuterol before activity)  Albuterol use two time or less a week on average (not counting use with activity)  Cough interfering with sleep two time or less a month  Oral steroids no more than once a year  No hospitalizations  Chronic rhinitis Continue nasal ipratropium 2 sprays each nostril once a day as needed for runny nose. Continue saline nasal rinse as needed for nasal symptoms.  Continue Dymista nasal spray-using 2 sprays each nostril twice a day to help with nasal congestion and runny nose.   Reflux Continue Protonix once a day to help with reflux.  Lifestyle Changes for Controlling GERD When you have GERD, stomach acid feels as if it's backing up toward your mouth. Whether or not you take medication to control your GERD, your symptoms can often be improved with lifestyle changes.   Raise Your Head  Reflux is more likely to strike when you're lying down flat, because stomach fluid can  flow backward more easily. Raising the head of your bed 4-6 inches can help. To do this:  Slide blocks or books under the legs at the head of your bed. Or, place a wedge under  the mattress. Many foam stores can make a suitable wedge for you. The wedge  should run from your waist to the top of your head.  Don't just prop your head on several pillows. This increases  pressure on your  stomach. It can make GERD worse.  Watch Your Eating Habits Certain foods may increase the acid in your stomach or relax the lower esophageal sphincter, making GERD more likely. It's best to avoid the following:  Coffee, tea, and carbonated drinks (with and without caffeine)  Fatty, fried, or spicy food  Mint, chocolate, onions, and tomatoes  Any other foods that seem to irritate your stomach or cause you pain  Relieve the Pressure  Eat smaller meals, even if you have to eat more often.  Don't lie down right after you eat. Wait a few hours for your stomach to empty.  Avoid tight belts and tight-fitting clothes.  Lose excess weight.  Tobacco and Alcohol  Avoid smoking tobacco and drinking alcohol. They can make GERD symptoms worse.  Please let us know if this treatment plan is not working well for you. Schedule follow up appointment in 2 months.

## 2020-03-02 NOTE — Progress Notes (Signed)
[name] [name] [address] Dept: [phone]  FOLLOW UP NOTE  Patient ID: Caitlin Tanner, female    DOB: [birth year]  Age: 69 y.o. MRN: [id number] Date of Office Visit: 03/02/2020  Assessment  Chief Complaint: Asthma (Having a rough time past three weeks, had to use rescue inhaler) and Allergic Rhinitis  (Having a rough time past three weeks, sneezing, nose running)  HPI Caitlin Tanner is a 69 year old female who presents for follow-up of moderate persistent asthma, chronic rhinitis and reflux.  She was last seen November 24, 2019 by Dr. Ernst Bowler.  Asthma is reported as not well controlled.  She is currently using Advair 230/21 2 puffs a couple of times a week.  She is using her albuterol inhaler 1-2 times every couple weeks.  For the past couple weeks she has had off and on dry cough, wheezing, tightness in chest and shortness of breath at times.  She denies any fever, chills trips to the emergency room or urgent care, nocturnal symptoms or use of systemic steroids since her last office visit.  Chronic rhinitis is reported as moderately controlled with the use of nasal ipratropium spray as needed, saline nasal rinse once a day and Dymista nasal spray as needed.  She reports clear rhinorrhea, nasal congestion left nare worse than right and postnasal drip.  Reflux is reported as controlled with Protonix once a day.  Medications are as listed in chart.   Drug Allergies:  Allergies  Allergen Reactions  . Conj Estrog-Medroxyprogest Ace     REACTION: UNKNOWN REACTION  . Ketorolac Tromethamine     Felt like she had "bugs in her head"  . Sulfa Antibiotics Nausea And Vomiting  . Tramadol Hcl Nausea Only    Physical Exam: BP (!) 150/90 (BP Location: Right Arm, Patient Position: Sitting, Cuff Size: Normal)   Pulse 65   Resp 16   SpO2 96%    Physical Exam Constitutional:      Appearance: Normal appearance.  HENT:     Head: Normocephalic and atraumatic.   Comments: Pharynx normal. Eyes normal. Ears normal. Nose: bilateral turbinates moderately edematous with slight erythema. Left greater than right.    Right Ear: Tympanic membrane, ear canal and external ear normal.     Left Ear: Tympanic membrane, ear canal and external ear normal.     Mouth/Throat:     Mouth: Mucous membranes are moist.     Pharynx: Oropharynx is clear.  Eyes:     Conjunctiva/sclera: Conjunctivae normal.  Cardiovascular:     Rate and Rhythm: Normal rate and regular rhythm.     Pulses: Normal pulses.     Heart sounds: Normal heart sounds.  Pulmonary:     Effort: Pulmonary effort is normal.     Breath sounds: Normal breath sounds.     Comments: Lungs clear to auscultation. Skin:    General: Skin is warm.  Neurological:     Mental Status: She is alert and oriented to person, place, and time.  Psychiatric:        Mood and Affect: Mood normal.        Behavior: Behavior normal.        Thought Content: Thought content normal.        Judgment: Judgment normal.     Diagnostics: FVC 1.72 L, FEV1 1.13 L.  Predicted FVC 3.33 L, FEV1 2.52 L.  Post bronchodilator therapy FVC 1.74 L, FEV1 1.14 L spirometry indicates moderate restriction and mild airway obstruction  with no significant bronchodilator response.  Assessment and Plan: 1. Moderate persistent asthma with acute exacerbation   2. Gastroesophageal reflux disease, unspecified whether esophagitis present   3. Chronic rhinitis     No orders of the defined types were placed in this encounter.   Patient Instructions  Asthma Start prednisone 10 mg taper. Take 2 tablets twice a day for three days, then on the 4th day take 2 tablets in the morning and then on the 5th day take one tablet and stop. Continue Advair HFA 230/21, but use 2 puffs twice a day every day with spacer to help prevent cough and wheeze. For asthma flareups may use Qvar 80 mcg 2 puffs twice a day for 1 to 2 weeks. May use albuterol 2 puffs every 4  hours as needed for coughing, wheezing, tightness in chest or shortness of breath.  Also may use albuterol 2 puffs 5 to 15 minutes prior to exercise. Asthma control goals:   Full participation in all desired activities (may need albuterol before activity)  Albuterol use two time or less a week on average (not counting use with activity)  Cough interfering with sleep two time or less a month  Oral steroids no more than once a year  No hospitalizations  Chronic rhinitis Continue nasal ipratropium 2 sprays each nostril once a day as needed for runny nose. Continue saline nasal rinse as needed for nasal symptoms.  Continue Dymista nasal spray-using 2 sprays each nostril twice a day to help with nasal congestion and runny nose.   Reflux Continue Protonix once a day to help with reflux.  Lifestyle Changes for Controlling GERD When you have GERD, stomach acid feels as if it's backing up toward your mouth. Whether or not you take medication to control your GERD, your symptoms can often be improved with lifestyle changes.   Raise Your Head  Reflux is more likely to strike when you're lying down flat, because stomach fluid can  flow backward more easily. Raising the head of your bed 4-6 inches can help. To do this:  Slide blocks or books under the legs at the head of your bed. Or, place a wedge under  the mattress. Many foam stores can make a suitable wedge for you. The wedge  should run from your waist to the top of your head.  Don't just prop your head on several pillows. This increases pressure on your  stomach. It can make GERD worse.  Watch Your Eating Habits Certain foods may increase the acid in your stomach or relax the lower esophageal sphincter, making GERD more likely. It's best to avoid the following:  Coffee, tea, and carbonated drinks (with and without caffeine)  Fatty, fried, or spicy food  Mint, chocolate, onions, and tomatoes  Any other foods that seem to  irritate your stomach or cause you pain  Relieve the Pressure  Eat smaller meals, even if you have to eat more often.  Don't lie down right after you eat. Wait a few hours for your stomach to empty.  Avoid tight belts and tight-fitting clothes.  Lose excess weight.  Tobacco and Alcohol  Avoid smoking tobacco and drinking alcohol. They can make GERD symptoms worse.  Please let us know if this treatment plan is not working well for you. Schedule follow up appointment in 2 months.   Return in about 2 months (around 05/02/2020), or if symptoms worsen or fail to improve.    Thank you for the opportunity to care for  this patient.  Please do not hesitate to contact me with questions.  Althea Charon, FNP [facility]

## 2020-03-14 ENCOUNTER — Encounter: Payer: Medicare Other | Admitting: Gastroenterology

## 2020-06-01 ENCOUNTER — Ambulatory Visit: Payer: Medicare Other | Admitting: Allergy & Immunology

## 2020-06-05 NOTE — Patient Instructions (Addendum)
Asthma Continue Advair HFA 230/21, but use 2 puffs twice a day every day with spacer to help prevent cough and wheeze. Start Qvar 80 mg 2 puffs twice a day for 1-2 weeks For asthma flareups may use Qvar 80 mcg 2 puffs twice a day for 1 to 2 weeks. May use albuterol 2 puffs every 4 hours as needed for coughing, wheezing, tightness in chest or shortness of breath.  Also may use albuterol 2 puffs 5 to 15 minutes prior to exercise. Get chest xray today- we will call you with results Asthma control goals:   Full participation in all desired activities (may need albuterol before activity)  Albuterol use two time or less a week on average (not counting use with activity)  Cough interfering with sleep two time or less a month  Oral steroids no more than once a year  No hospitalizations  Chronic rhinitis Continue nasal ipratropium 2 sprays each nostril once a day as needed for runny nose. Continue saline nasal rinse as needed for nasal symptoms.  Continue Dymista nasal spray-using 2 sprays each nostril twice a day to help with nasal congestion and runny nose.   Reflux Continue Protonix once a day to help with reflux.  Light headed Call and schedule an appointment with your primary care physician and/or cardiologist to discuss this  Lifestyle Changes for Controlling GERD When you have GERD, stomach acid feels as if it's backing up toward your mouth. Whether or not you take medication to control your GERD, your symptoms can often be improved with lifestyle changes.   Raise Your Head  Reflux is more likely to strike when you're lying down flat, because stomach fluid can  flow backward more easily. Raising the head of your bed 4-6 inches can help. To do this:  Slide blocks or books under the legs at the head of your bed. Or, place a wedge under  the mattress. Many foam stores can make a suitable wedge for you. The wedge  should run from your waist to the top of your head.  Don't just  prop your head on several pillows. This increases pressure on your  stomach. It can make GERD worse.  Watch Your Eating Habits Certain foods may increase the acid in your stomach or relax the lower esophageal sphincter, making GERD more likely. It's best to avoid the following:  Coffee, tea, and carbonated drinks (with and without caffeine)  Fatty, fried, or spicy food  Mint, chocolate, onions, and tomatoes  Any other foods that seem to irritate your stomach or cause you pain  Relieve the Pressure  Eat smaller meals, even if you have to eat more often.  Don't lie down right after you eat. Wait a few hours for your stomach to empty.  Avoid tight belts and tight-fitting clothes.  Lose excess weight.  Tobacco and Alcohol  Avoid smoking tobacco and drinking alcohol. They can make GERD symptoms worse.  Please let us know if this treatment plan is not working well for you. Schedule follow up appointment in .1 month

## 2020-06-06 ENCOUNTER — Other Ambulatory Visit: Payer: Self-pay

## 2020-06-06 ENCOUNTER — Ambulatory Visit (HOSPITAL_COMMUNITY)
Admission: RE | Admit: 2020-06-06 | Discharge: 2020-06-06 | Disposition: A | Discharge status: Home / Self Care | Payer: Medicare Other | Source: Ambulatory Visit | Attending: Family | Admitting: Family

## 2020-06-06 ENCOUNTER — Ambulatory Visit (INDEPENDENT_AMBULATORY_CARE_PROVIDER_SITE_OTHER): Payer: Medicare Other | Admitting: Family

## 2020-06-06 ENCOUNTER — Encounter: Payer: Self-pay | Admitting: Family

## 2020-06-06 VITALS — BP 152/80 | HR 70 | Temp 98.2°F | Resp 18

## 2020-06-06 DIAGNOSIS — K219 Gastro-esophageal reflux disease without esophagitis: Secondary | ICD-10-CM | POA: Diagnosis not present

## 2020-06-06 DIAGNOSIS — R059 Cough, unspecified: Secondary | ICD-10-CM

## 2020-06-06 DIAGNOSIS — R05 Cough: Secondary | ICD-10-CM | POA: Insufficient documentation

## 2020-06-06 DIAGNOSIS — J4540 Moderate persistent asthma, uncomplicated: Secondary | ICD-10-CM

## 2020-06-06 DIAGNOSIS — J310 Chronic rhinitis: Secondary | ICD-10-CM | POA: Diagnosis not present

## 2020-06-06 NOTE — Progress Notes (Signed)
[address], [address] [place] [address] Dept: [phone]  FOLLOW UP NOTE  Patient ID: Caitlin Tanner, female    DOB: 1951-01-15  Age: 69 y.o. MRN: [id number] Date of Office Visit: 06/06/2020  Assessment  Chief Complaint: Follow-up and Asthma  HPI Caitlin Tanner is a 69 year old female who presents today for follow-up of moderate persistent asthma, chronic rhinitis, and gastroesophageal reflux disease.  She was last seen on March 02, 2020 by Althea Charon, FNP.  Moderate persistent asthma was reported as doing better until this past Sunday.  She is currently taking Advair HFA 230/21 2 puffs twice a day with a spacer and has used her albuterol twice this week. She has not added on her Qvar 80 mcg 2 puffs twice a day for 1-2 weeks with asthma flares. She reports since Sunday she has had a productive cough with green sputum, tightness in her chest at times, occasional wheezing, and shortness of breath with exertion.  She denies any nocturnal awakenings, body aches, fever, chills, or sick exposure.She was last given prednisone at her last office visit on 03/02/2020.  Chronic rhinitis is reported as moderately controlled with ipratropium bromide nasal spray and Dymista nasal spray. She reports occasional clear rhinorrhea, nasal congestion and scratchy throat in the mornings. She also reports sneezing.  Reflux is reported as controlled with Protonix once a day.  Current medications are as listed in the chart.   Drug Allergies:  Allergies  Allergen Reactions  . Conj Estrog-Medroxyprogest Ace     REACTION: UNKNOWN REACTION  . Ketorolac Tromethamine     Felt like she had "bugs in her head"  . Sulfa Antibiotics Nausea And Vomiting  . Tramadol Hcl Nausea Only    Review of Systems: Review of Systems  Constitutional: Negative for chills and fever.  HENT:       Reports occasional clear rhinorrhea and nasal congestion  Eyes:       Denies itchy watery eyes  Respiratory: Positive  for cough, shortness of breath and wheezing.   Cardiovascular: Negative for chest pain and palpitations.  Gastrointestinal: Negative for abdominal pain, heartburn, nausea and vomiting.  Genitourinary: Positive for dysuria.  Musculoskeletal: Negative for myalgias.  Skin: Negative for itching and rash.  Neurological: Negative for headaches.    Physical Exam: BP (!) 152/80   Pulse 70   Temp 98.2 F (36.8 C) (Temporal)   Resp 18   SpO2 96%    Physical Exam Constitutional:      Appearance: Normal appearance.  HENT:     Head: Normocephalic and atraumatic.     Comments: Pharynx normal. Eyes normal. Ears normal. Nose: thick white drainage noted    Right Ear: Tympanic membrane, ear canal and external ear normal.     Left Ear: Tympanic membrane, ear canal and external ear normal.     Mouth/Throat:     Mouth: Mucous membranes are moist.     Pharynx: Oropharynx is clear.  Eyes:     Conjunctiva/sclera: Conjunctivae normal.  Cardiovascular:     Rate and Rhythm: Regular rhythm.     Heart sounds: Normal heart sounds.  Pulmonary:     Effort: Pulmonary effort is normal.     Breath sounds: Normal breath sounds.     Comments: Lungs clear to auscultation Musculoskeletal:     Cervical back: Neck supple.  Skin:    General: Skin is warm.  Neurological:     Mental Status: She is alert and oriented to person, place, and  time.  Psychiatric:        Mood and Affect: Mood normal.        Behavior: Behavior normal.        Thought Content: Thought content normal.        Judgment: Judgment normal.     Diagnostics: FVC 2.01 L, FEV1 1.53 L.  Predicted FVC 3.23 L, FEV1 2.44 L.  Spirometry indicates moderate restriction.  This is improved from her previous spirometry.  Assessment and Plan: 1. Not well controlled moderate persistent asthma   2. Cough   3. Chronic rhinitis   4. Gastroesophageal reflux disease, unspecified whether esophagitis present     No orders of the defined types were placed  in this encounter.   Patient Instructions  Asthma Continue Advair HFA 230/21, but use 2 puffs twice a day every day with spacer to help prevent cough and wheeze. Start Qvar 80 mg 2 puffs twice a day for 1-2 weeks For asthma flareups may use Qvar 80 mcg 2 puffs twice a day for 1 to 2 weeks. May use albuterol 2 puffs every 4 hours as needed for coughing, wheezing, tightness in chest or shortness of breath.  Also may use albuterol 2 puffs 5 to 15 minutes prior to exercise. Get chest xray today- we will call you with results Asthma control goals:   Full participation in all desired activities (may need albuterol before activity)  Albuterol use two time or less a week on average (not counting use with activity)  Cough interfering with sleep two time or less a month  Oral steroids no more than once a year  No hospitalizations  Chronic rhinitis Continue nasal ipratropium 2 sprays each nostril once a day as needed for runny nose. Continue saline nasal rinse as needed for nasal symptoms.  Continue Dymista nasal spray-using 2 sprays each nostril twice a day to help with nasal congestion and runny nose.   Reflux Continue Protonix once a day to help with reflux.  Light headed Call and schedule an appointment with your primary care physician and/or cardiologist to discuss this  Lifestyle Changes for Controlling GERD When you have GERD, stomach acid feels as if it's backing up toward your mouth. Whether or not you take medication to control your GERD, your symptoms can often be improved with lifestyle changes.   Raise Your Head  Reflux is more likely to strike when you're lying down flat, because stomach fluid can  flow backward more easily. Raising the head of your bed 4-6 inches can help. To do this:  Slide blocks or books under the legs at the head of your bed. Or, place a wedge under  the mattress. Many foam stores can make a suitable wedge for you. The wedge  should run from  your waist to the top of your head.  Don't just prop your head on several pillows. This increases pressure on your  stomach. It can make GERD worse.  Watch Your Eating Habits Certain foods may increase the acid in your stomach or relax the lower esophageal sphincter, making GERD more likely. It's best to avoid the following:  Coffee, tea, and carbonated drinks (with and without caffeine)  Fatty, fried, or spicy food  Mint, chocolate, onions, and tomatoes  Any other foods that seem to irritate your stomach or cause you pain  Relieve the Pressure  Eat smaller meals, even if you have to eat more often.  Don't lie down right after you eat. Wait a few hours for  your stomach to empty.  Avoid tight belts and tight-fitting clothes.  Lose excess weight.  Tobacco and Alcohol  Avoid smoking tobacco and drinking alcohol. They can make GERD symptoms worse.  Please let us know if this treatment plan is not working well for you. Schedule follow up appointment in .1 month   Return in about 4 weeks (around 07/04/2020), or if symptoms worsen or fail to improve.    Thank you for the opportunity to care for this patient.  Please do not hesitate to contact me with questions.  Althea Charon, FNP [facility]

## 2020-06-07 NOTE — Progress Notes (Signed)
Please let the patient know that her chest xray does not show any active pulmonary disease. 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 Have her continue our current plan as we talked about yesterday and let us know if she does not get better.

## 2020-06-08 ENCOUNTER — Other Ambulatory Visit: Payer: Self-pay | Admitting: Family

## 2020-06-08 ENCOUNTER — Other Ambulatory Visit: Payer: Self-pay | Admitting: *Deleted

## 2020-06-08 MED ORDER — QVAR REDIHALER 80 MCG/ACT IN AERB: INHALATION_SPRAY | RESPIRATORY_TRACT | 5 refills | Status: DC

## 2020-07-03 NOTE — Patient Instructions (Addendum)
Asthma Continue Advair HFA 230/21, but use 2 puffs twice a day every day with spacer to help prevent cough and wheeze. For asthma flareups may use Qvar 80 mcg 2 puffs twice a day for 1 to 2 weeks. May use albuterol 2 puffs every 4 hours as needed for coughing, wheezing, tightness in chest or shortness of breath.  Also may use albuterol 2 puffs 5 to 15 minutes prior to exercise. Asthma control goals:   Full participation in all desired activities (may need albuterol before activity)  Albuterol use two time or less a week on average (not counting use with activity)  Cough interfering with sleep two time or less a month  Oral steroids no more than once a year  No hospitalizations  Chronic rhinitis Continue nasal ipratropium 2 sprays each nostril once a day as needed for runny nose. Continue saline nasal rinse as needed for nasal symptoms.  Continue Dymista nasal spray-using 2 sprays each nostril twice a day to help with nasal congestion and runny nose.   Reflux Continue Protonix once a day to help with reflux. Continue dietary and lifestyle modifications  Please let us know if this treatment plan is not working well for you. Schedule follow up appointment in 3 months  Lifestyle Changes for Controlling GERD When you have GERD, stomach acid feels as if it's backing up toward your mouth. Whether or not you take medication to control your GERD, your symptoms can often be improved with lifestyle changes.   Raise Your Head  Reflux is more likely to strike when you're lying down flat, because stomach fluid can  flow backward more easily. Raising the head of your bed 4-6 inches can help. To do this:  Slide blocks or books under the legs at the head of your bed. Or, place a wedge under  the mattress. Many foam stores can make a suitable wedge for you. The wedge  should run from your waist to the top of your head.  Don't just prop your head on several pillows. This increases pressure  on your  stomach. It can make GERD worse.  Watch Your Eating Habits Certain foods may increase the acid in your stomach or relax the lower esophageal sphincter, making GERD more likely. It's best to avoid the following:  Coffee, tea, and carbonated drinks (with and without caffeine)  Fatty, fried, or spicy food  Mint, chocolate, onions, and tomatoes  Any other foods that seem to irritate your stomach or cause you pain  Relieve the Pressure  Eat smaller meals, even if you have to eat more often.  Don't lie down right after you eat. Wait a few hours for your stomach to empty.  Avoid tight belts and tight-fitting clothes.  Lose excess weight.  Tobacco and Alcohol  Avoid smoking tobacco and drinking alcohol. They can make GERD symptoms worse.

## 2020-07-04 ENCOUNTER — Ambulatory Visit (INDEPENDENT_AMBULATORY_CARE_PROVIDER_SITE_OTHER): Payer: Medicare Other | Admitting: Family

## 2020-07-04 ENCOUNTER — Other Ambulatory Visit: Payer: Self-pay

## 2020-07-04 ENCOUNTER — Encounter: Payer: Self-pay | Admitting: Family

## 2020-07-04 ENCOUNTER — Other Ambulatory Visit: Payer: Self-pay | Admitting: Family

## 2020-07-04 VITALS — BP 140/80 | HR 62 | Temp 97.7°F | Resp 16

## 2020-07-04 DIAGNOSIS — J310 Chronic rhinitis: Secondary | ICD-10-CM

## 2020-07-04 DIAGNOSIS — J4540 Moderate persistent asthma, uncomplicated: Secondary | ICD-10-CM

## 2020-07-04 DIAGNOSIS — K219 Gastro-esophageal reflux disease without esophagitis: Secondary | ICD-10-CM | POA: Diagnosis not present

## 2020-07-04 MED ORDER — QVAR REDIHALER 80 MCG/ACT IN AERB: INHALATION_SPRAY | RESPIRATORY_TRACT | 3 refills | Status: DC

## 2020-07-04 NOTE — Progress Notes (Signed)
[place], [address] [place] [address] Dept: [phone]  FOLLOW UP NOTE  Patient ID: Caitlin Tanner, female    DOB: 06-20-1951  Age: 69 y.o. MRN: [id number] Date of Office Visit: 07/04/2020  Assessment  Chief Complaint: Follow-up and Asthma (no complaints)  HPI Caitlin Tanner is 69 year old female who presents today for follow-up of not well controlled moderate persistent asthma, cough, chronic rhinitis, gastroesophageal reflux disease. She was last seen on 06/06/2020 by myself.  Moderate persistent asthma is reported as moderately controlled with Advair HFA 230/21 using 2 puffs twice a day with a spacer and albuterol as needed.  She reports that her breathing has been much better since her last office visit.  She reports an occasional dry cough, seldom wheeze, and tightness in her chest at times.  She denies any shortness of breath or nocturnal awakenings.  Since her last office visit she has not required any systemic steroids made any trips to the emergency room or urgent care due to breathing problems.  She is used her albuterol inhaler 2 times since her last office visit.  Chronic rhinitis is reported as moderately controlled with Claritin as needed, ipratropium bromide nasal spray as needed, and Dymista nasal spray as needed.  She reports occasional clear rhinorrhea, nasal congestion and postnasal drip.  She denies any itchy watery eyes.  Reflux is reported as moderately controlled with Protonix once a day.  She did report that there was one time that she did have heartburn, but feels that this was due to the food that she ate.  Her lightheadedness is reported as resolved.  Current medications are as listed in the chart.   Drug Allergies:  Allergies  Allergen Reactions   Conj Estrog-Medroxyprogest Ace     REACTION: UNKNOWN REACTION   Ketorolac Tromethamine     Felt like she had "bugs in her head"   Sulfa Antibiotics Nausea And Vomiting   Tramadol Hcl Nausea  Only    Review of Systems: Review of Systems  Constitutional: Negative for chills and fever.  HENT:       Reports occasional post nasal drip, clear rhinorrhea, and nasal congesiton  Eyes:       Denies itchy watery eyes  Respiratory: Positive for cough and wheezing. Negative for shortness of breath.   Cardiovascular: Negative for chest pain and palpitations.  Gastrointestinal: Positive for heartburn. Negative for abdominal pain.  Genitourinary: Negative for dysuria.  Skin: Negative for itching and rash.  Neurological: Positive for headaches.       Reports occasional headaches    Physical Exam: BP 140/80    Pulse 62    Temp 97.7 F (36.5 C) (Temporal)    Resp 16    SpO2 95%    Physical Exam Constitutional:      Appearance: Normal appearance.  HENT:     Head: Normocephalic and atraumatic.     Comments: Pharynx normal. Ears normal. Nose normal. Eyes: left eye normal. Right eye: pt reports she had injection in her right eye due to fluid behind her eye and this causes her redness    Right Ear: Tympanic membrane, ear canal and external ear normal.     Left Ear: Tympanic membrane, ear canal and external ear normal.     Nose: Nose normal.     Mouth/Throat:     Mouth: Mucous membranes are moist.     Pharynx: Oropharynx is clear.  Cardiovascular:     Rate and Rhythm: Regular rhythm.  Heart sounds: Normal heart sounds.  Pulmonary:     Effort: Pulmonary effort is normal.     Breath sounds: Normal breath sounds.     Comments: Lungs clear to auscultation Musculoskeletal:     Cervical back: Neck supple.  Skin:    General: Skin is warm.  Neurological:     Mental Status: She is alert and oriented to person, place, and time.  Psychiatric:        Mood and Affect: Mood normal.        Behavior: Behavior normal.        Thought Content: Thought content normal.        Judgment: Judgment normal.     Diagnostics: FVC 1.95 L, FEV1 1.40 L.  Predicted FVC 3.23 L, FEV1 2.44 L.   Spirometry indicates moderate restriction.  Spirometry is consistent with previous spirometry.  Assessment and Plan: 1. Moderate persistent asthma, uncomplicated   2. Chronic rhinitis   3. Gastroesophageal reflux disease, unspecified whether esophagitis present     No orders of the defined types were placed in this encounter.   Patient Instructions  Asthma Continue Advair HFA 230/21, but use 2 puffs twice a day every day with spacer to help prevent cough and wheeze. For asthma flareups may use Qvar 80 mcg 2 puffs twice a day for 1 to 2 weeks. May use albuterol 2 puffs every 4 hours as needed for coughing, wheezing, tightness in chest or shortness of breath.  Also may use albuterol 2 puffs 5 to 15 minutes prior to exercise. Asthma control goals:   Full participation in all desired activities (may need albuterol before activity)  Albuterol use two time or less a week on average (not counting use with activity)  Cough interfering with sleep two time or less a month  Oral steroids no more than once a year  No hospitalizations  Chronic rhinitis Continue nasal ipratropium 2 sprays each nostril once a day as needed for runny nose. Continue saline nasal rinse as needed for nasal symptoms.  Continue Dymista nasal spray-using 2 sprays each nostril twice a day to help with nasal congestion and runny nose.   Reflux Continue Protonix once a day to help with reflux. Continue dietary and lifestyle modifications  Please let us know if this treatment plan is not working well for you. Schedule follow up appointment in 3 months  Lifestyle Changes for Controlling GERD When you have GERD, stomach acid feels as if its backing up toward your mouth. Whether or not you take medication to control your GERD, your symptoms can often be improved with lifestyle changes.   Raise Your Head  Reflux is more likely to strike when youre lying down flat, because stomach fluid can  flow backward more  easily. Raising the head of your bed 4-6 inches can help. To do this:  Slide blocks or books under the legs at the head of your bed. Or, place a wedge under  the mattress. Many foam stores can make a suitable wedge for you. The wedge  should run from your waist to the top of your head.  Dont just prop your head on several pillows. This increases pressure on your  stomach. It can make GERD worse.  Watch Your Eating Habits Certain foods may increase the acid in your stomach or relax the lower esophageal sphincter, making GERD more likely. Its best to avoid the following:  Coffee, tea, and carbonated drinks (with and without caffeine)  Fatty, fried, or spicy food  Mint, chocolate, onions, and tomatoes  Any other foods that seem to irritate your stomach or cause you pain  Relieve the Pressure  Eat smaller meals, even if you have to eat more often.  Dont lie down right after you eat. Wait a few hours for your stomach to empty.  Avoid tight belts and tight-fitting clothes.  Lose excess weight.  Tobacco and Alcohol  Avoid smoking tobacco and drinking alcohol. They can make GERD symptoms worse.     Return in about 3 months (around 10/04/2020), or if symptoms worsen or fail to improve.    Thank you for the opportunity to care for this patient.  Please do not hesitate to contact me with questions.  Althea Charon, FNP [facility] [place]

## 2020-07-17 ENCOUNTER — Other Ambulatory Visit: Payer: Self-pay | Admitting: Nurse Practitioner

## 2020-07-19 ENCOUNTER — Telehealth: Payer: Self-pay | Admitting: *Deleted

## 2020-07-19 MED ORDER — PANTOPRAZOLE SODIUM 40 MG PO TBEC: 40.0000 mg | DELAYED_RELEASE_TABLET | Freq: Every day | ORAL | 3 refills | Status: DC

## 2020-07-19 MED ORDER — PANTOPRAZOLE SODIUM 40 MG PO TBEC: 40.0000 mg | DELAYED_RELEASE_TABLET | Freq: Every day | ORAL | 0 refills | Status: DC

## 2020-07-19 NOTE — Telephone Encounter (Signed)
Completed.

## 2020-07-19 NOTE — Telephone Encounter (Signed)
Pt would like a RX for mail order for Pantoprazole 40 mg once daily.  She would like a 90 day supply.  She wants to do through [facility] order.  Pt would also like one month supply sent to [facility] for this month.

## 2020-07-19 NOTE — Addendum Note (Signed)
Addended by: Annitta Needs on: 07/19/2020 11:43 AM   Modules accepted: Orders

## 2020-09-03 ENCOUNTER — Other Ambulatory Visit: Payer: Self-pay | Admitting: Allergy & Immunology

## 2020-09-11 ENCOUNTER — Other Ambulatory Visit: Payer: Self-pay | Admitting: Allergy & Immunology

## 2020-10-02 NOTE — Patient Instructions (Incomplete)
Asthma Stop Advair 230/21 Start Breo 200 mcg 1 puff once day to help prevent cough and wheeze May use albuterol 2 puffs every 4 hours as needed for coughing, wheezing, tightness in chest or shortness of breath.  Also may use albuterol 2 puffs 5 to 15 minutes prior to exercise. Asthma control goals:   Full participation in all desired activities (may need albuterol before activity)  Albuterol use two time or less a week on average (not counting use with activity)  Cough interfering with sleep two time or less a month  Oral steroids no more than once a year  No hospitalizations  Chronic rhinitis  Continue saline nasal rinse as needed for nasal symptoms.   Continue Dymista nasal spray-using 2 sprays each nostril twice a day to help with nasal congestion and runny nose.   Reflux Continue Protonix once a day to help with reflux. Continue dietary and lifestyle modifications  Please let us know if this treatment plan is not working well for you. Schedule follow up appointment in 4 weeks

## 2020-10-03 ENCOUNTER — Other Ambulatory Visit: Payer: Self-pay

## 2020-10-03 ENCOUNTER — Encounter: Payer: Self-pay | Admitting: Family

## 2020-10-03 ENCOUNTER — Ambulatory Visit (INDEPENDENT_AMBULATORY_CARE_PROVIDER_SITE_OTHER): Payer: Medicare Other | Admitting: Family

## 2020-10-03 VITALS — BP 130/80 | HR 70 | Temp 98.4°F | Resp 18

## 2020-10-03 DIAGNOSIS — K219 Gastro-esophageal reflux disease without esophagitis: Secondary | ICD-10-CM

## 2020-10-03 DIAGNOSIS — J4540 Moderate persistent asthma, uncomplicated: Secondary | ICD-10-CM

## 2020-10-03 DIAGNOSIS — J310 Chronic rhinitis: Secondary | ICD-10-CM

## 2020-10-03 MED ORDER — BREO ELLIPTA 200-25 MCG/INH IN AEPB: 1.0000 | INHALATION_SPRAY | Freq: Every day | RESPIRATORY_TRACT | 5 refills | Status: DC

## 2020-10-03 NOTE — Progress Notes (Signed)
[address] [address] [place] [address] Dept: [phone]  FOLLOW UP NOTE  Patient ID: Caitlin Tanner, female    DOB: [birth year]  Age: 70 y.o. MRN: [id number] Date of Office Visit: 10/03/2020  Assessment  Chief Complaint: Asthma (Doing good. She says that her asthma is not as bad as it was before because she is using her inhalers the way she is supposed to. )  HPI Caitlin Tanner is a 70 year old female who presents today for follow-up of moderate persistent asthma, chronic rhinitis, and gastroesophageal reflux disease.  She was last seen on July 04, 2020 by Nehemiah Settle, FNP.  Moderate persistent asthma is reported as moderately controlled with Advair HFA 230/21 mcg 2 puffs once a day, but there are some days that she remembers to take the 2 puffs at night. She is hesitant to try a new medication, but is willing.  She reports occasional dry cough that can be productive with clear sputum in the morning.  She also reports a little bit of wheezing, tightness in chest, and shortness of breath.  She denies any nocturnal awakenings due to breathing problems since her last office visit. She has not required any systemic steroids or made any trips to the emergency room or urgent care due to breathing problems.  She has used her albuterol inhaler approximately 1 time since her last office visit  Chronic rhinitis is reported as moderately controlled with Dymista nasal spray as needed. Instructed her to try using this medication more consistently to see if it helps with her symptoms.  She does not have ipratropium bromide nasal spray.  She reports occasional clear rhinorrhea, postnasal drip, sinus tenderness, and nasal congestion.  Reflux is reported as moderately controlled with Protonix once a day.  She reports occasional heartburn for which she will take Tums and this will help.   Drug Allergies:  Allergies  Allergen Reactions  . Conj Estrog-Medroxyprogest Ace     REACTION: UNKNOWN  REACTION  . Ketorolac Tromethamine     Felt like she had "bugs in her head"  . Sulfa Antibiotics Nausea And Vomiting  . Tramadol Hcl Nausea Only    Review of Systems: Review of Systems  Constitutional: Negative for chills and fever.  HENT:       Reports post nasal drip, clear rhinorrhea, and nasal congestion  Eyes:       Denies itchy watery eyes  Respiratory:       Reports occasional dry cough that can be productive in the morning with clear sputum. Also reports a little bit of wheezing, tightness in chest and shortness of breath.  Cardiovascular: Positive for chest pain. Negative for palpitations.  Gastrointestinal: Positive for heartburn. Negative for abdominal pain.       Reports occasional heartburn for which she takes Tums  Genitourinary: Negative for dysuria.  Skin: Negative for itching and rash.  Neurological: Negative for headaches.    Physical Exam: BP 130/80 (BP Location: Left Arm, Patient Position: Sitting, Cuff Size: Normal)   Pulse 70   Temp 98.4 F (36.9 C) (Temporal)   Resp 18   SpO2 94%    Physical Exam Constitutional:      Appearance: Normal appearance.  HENT:     Head: Normocephalic and atraumatic.     Comments: Index normal, eyes normal, ears normal, nose bilateral lower turbinates moderately edematous and slightly erythematous with no drainage noted    Right Ear: Tympanic membrane, ear canal and external ear normal.  Left Ear: Tympanic membrane, ear canal and external ear normal.     Mouth/Throat:     Mouth: Mucous membranes are moist.     Pharynx: Oropharynx is clear.  Eyes:     Conjunctiva/sclera: Conjunctivae normal.  Cardiovascular:     Rate and Rhythm: Regular rhythm.  Pulmonary:     Effort: Pulmonary effort is normal.     Breath sounds: Normal breath sounds.     Comments: Lungs clear to auscultation Musculoskeletal:     Cervical back: Neck supple.  Skin:    General: Skin is warm.  Neurological:     Mental Status: She is alert and  oriented to person, place, and time.  Psychiatric:        Mood and Affect: Mood normal.        Behavior: Behavior normal.        Thought Content: Thought content normal.        Judgment: Judgment normal.     Diagnostics: FVC 1.70 L FEV1 1.38 L.  Predicted FVC 3.19 L, FEV1 2.41 L.  Spirometry indicates moderate restriction.  Spirometry is consistent with previous spirometry's  Assessment and Plan: 1. Not well controlled moderate persistent asthma   2. Chronic rhinitis   3. Gastroesophageal reflux disease, unspecified whether esophagitis present     No orders of the defined types were placed in this encounter.   Patient Instructions  Asthma Stop Advair 230/21 Start Breo 200 mcg 1 puff once day to help prevent cough and wheeze May use albuterol 2 puffs every 4 hours as needed for coughing, wheezing, tightness in chest or shortness of breath.  Also may use albuterol 2 puffs 5 to 15 minutes prior to exercise. Asthma control goals:   Full participation in all desired activities (may need albuterol before activity)  Albuterol use two time or less a week on average (not counting use with activity)  Cough interfering with sleep two time or less a month  Oral steroids no more than once a year  No hospitalizations  Chronic rhinitis  Continue saline nasal rinse as needed for nasal symptoms.   Continue Dymista nasal spray-using 2 sprays each nostril twice a day to help with nasal congestion and runny nose.   Reflux Continue Protonix once a day to help with reflux. Continue dietary and lifestyle modifications  Please let us know if this treatment plan is not working well for you. Schedule follow up appointment in 4 weeks      Return in about 4 weeks (around 10/31/2020), or if symptoms worsen or fail to improve.    Thank you for the opportunity to care for this patient.  Please do not hesitate to contact me with questions.  Althea Charon, FNP [facility]  [place]

## 2020-10-05 ENCOUNTER — Telehealth: Payer: Self-pay

## 2020-10-05 NOTE — Telephone Encounter (Signed)
Patient called about this sample. Beth sent it to [facility]. I informed patient of this and she will pick it up today, 10/05/20.

## 2020-10-05 NOTE — Telephone Encounter (Signed)
Left message advising of sample available of Breo 200 in [place] and to call back with any questions.

## 2020-10-31 ENCOUNTER — Ambulatory Visit: Payer: Medicare Other | Admitting: Family

## 2020-11-20 NOTE — Patient Instructions (Addendum)
Asthma Stop Breo 200 mcg  Restart Advair 230/21- 2 puffs twice a day with a spacer to help prevent cough and wheeze May use albuterol 2 puffs every 4 hours as needed for coughing, wheezing, tightness in chest or shortness of breath.  Also may use albuterol 2 puffs 5 to 15 minutes prior to exercise. Asthma control goals:   Full participation in all desired activities (may need albuterol before activity)  Albuterol use two time or less a week on average (not counting use with activity)  Cough interfering with sleep two time or less a month  Oral steroids no more than once a year  No hospitalizations Refer to pulmonology due to possible restrictive lung disease Chronic rhinitis Continue saline nasal rinse as needed for nasal symptoms.  Continue Dymista nasal spray-using 2 sprays each nostril twice a day to help with nasal congestion and runny nose.   Reflux Continue Protonix once a day to help with reflux. Continue dietary and lifestyle modifications  Please let us know if this treatment plan is not working well for you. Schedule follow up appointment in 4 weeks with Dr. Ernst Bowler

## 2020-11-21 ENCOUNTER — Other Ambulatory Visit: Payer: Self-pay

## 2020-11-21 ENCOUNTER — Ambulatory Visit (INDEPENDENT_AMBULATORY_CARE_PROVIDER_SITE_OTHER): Payer: Medicare Other | Admitting: Family

## 2020-11-21 ENCOUNTER — Encounter: Payer: Self-pay | Admitting: Family

## 2020-11-21 VITALS — BP 140/82 | HR 78 | Temp 98.2°F | Resp 17 | Ht 67.52 in | Wt 181.6 lb

## 2020-11-21 DIAGNOSIS — J4540 Moderate persistent asthma, uncomplicated: Secondary | ICD-10-CM

## 2020-11-21 DIAGNOSIS — J310 Chronic rhinitis: Secondary | ICD-10-CM | POA: Diagnosis not present

## 2020-11-21 DIAGNOSIS — K219 Gastro-esophageal reflux disease without esophagitis: Secondary | ICD-10-CM | POA: Diagnosis not present

## 2020-11-21 MED ORDER — AZELASTINE-FLUTICASONE 137-50 MCG/ACT NA SUSP: NASAL | 1 refills | Status: DC

## 2020-11-21 MED ORDER — ADVAIR HFA 230-21 MCG/ACT IN AERO: INHALATION_SPRAY | RESPIRATORY_TRACT | 1 refills | Status: DC

## 2020-11-21 NOTE — Progress Notes (Signed)
[place], [address]  [place] [address] Dept: [phone]  FOLLOW UP NOTE  Patient ID: Caitlin Tanner, female    DOB: Sep 15, 1951  Age: 70 y.o. MRN: [id number] Date of Office Visit: 11/21/2020  Assessment  Chief Complaint: Asthma  HPI Caitlin Tanner is a 70 year old female who presents today for follow-up of moderate persistent asthma, chronic rhinitis, and gastroesophageal reflux disease.  She was last seen on October 03, 2020 by Althea Charon, FNP.  Moderate persistent asthma is reported as moderately controlled with Breo 200 mcg 1 puff once a day and albuterol as needed.  She has not been able to tell any difference since switching from Advair 230/21 mcg 2 puffs once a day to at times 2 puffs twice a day to Breo 200 mcg 1 puff once a day.  She reports a little dry cough, tightness in her chest at times, shortness of breath at times with exercise and denies wheezing.  Since her last office visit she has not required any systemic steroids or made any trips to the emergency room or urgent care due to breathing problems.  Her asthma symptoms do not wake her up at night.  She has not had to use her albuterol since her last office visit.  Her ACT score today is 24.  She is interested in possibly going back to Advair 230/21 mcg since Breo 200 mcg was costing her $75 and Advair 230/21 mcg only cost her $25.  She reports that in the past that she was supposed to have a full pulmonary function test but she did not ever get this completed due to having to be in a little box for this test.  Chronic rhinitis is reported as moderately controlled with Dymista nasal spray as needed, [employer] brand Claritin as needed, and saline nasal spray as needed.  She reports clear rhinorrhea, nasal congestion at times and postnasal drip at times.  Reflux is reported as doing well with Protonix once a day.  She reports that she will occasionally have heartburn when she eats foods that she is not supposed  to.   Drug Allergies:  Allergies  Allergen Reactions  . Conj Estrog-Medroxyprogest Ace     REACTION: UNKNOWN REACTION  . Ketorolac Tromethamine     Felt like she had "bugs in her head"  . Sulfa Antibiotics Nausea And Vomiting  . Tramadol Hcl Nausea Only    Review of Systems: Review of Systems  Constitutional: Negative for chills and fever.  HENT:       Reports clear rhinorrhea, nasal congestion at times and postnasal drip  Eyes:       Denies itchy watery eyes  Respiratory:       Reports a little dry cough, tightness in her chest at times, and shortness of breath at times with exercise.  She denies any wheezing  Cardiovascular: Negative for chest pain and palpitations.  Gastrointestinal: Positive for heartburn. Negative for abdominal pain.       She reports heartburn at times due to certain foods that she eats  Genitourinary: Negative for dysuria.  Skin: Negative for itching and rash.  Neurological: Positive for headaches.       Reports occasional headaches    Physical Exam: BP 140/82 (BP Location: Right Arm, Patient Position: Sitting, Cuff Size: Normal)   Pulse 78   Temp 98.2 F (36.8 C) (Temporal)   Resp 17   Ht 5' 7.52" (1.715 m)   Wt 181 lb 9.6 oz (82.4 kg)  SpO2 96%   BMI 28.01 kg/m    Physical Exam Constitutional:      Appearance: Normal appearance.  HENT:     Head: Normocephalic and atraumatic.     Right Ear: Tympanic membrane, ear canal and external ear normal.     Left Ear: Tympanic membrane, ear canal and external ear normal.     Nose: Nose normal.     Mouth/Throat:     Mouth: Mucous membranes are moist.     Pharynx: Oropharynx is clear.  Eyes:     Conjunctiva/sclera: Conjunctivae normal.  Cardiovascular:     Rate and Rhythm: Regular rhythm.     Heart sounds: Normal heart sounds.  Pulmonary:     Effort: Pulmonary effort is normal.     Breath sounds: Normal breath sounds.     Comments: Lungs clear to auscultation Musculoskeletal:     Cervical  back: Neck supple.  Skin:    General: Skin is warm.  Neurological:     Mental Status: She is alert and oriented to person, place, and time.  Psychiatric:        Mood and Affect: Mood normal.        Behavior: Behavior normal.        Thought Content: Thought content normal.        Judgment: Judgment normal.     Diagnostics: FVC 1.70 L, FEV1 1.29 L predicted FVC 3.29 L, FEV1 2.49 L.  Spirometry with moderate restriction  Assessment and Plan: 1. Moderate persistent asthma, uncomplicated   2. Chronic rhinitis   3. Gastroesophageal reflux disease, unspecified whether esophagitis present     No orders of the defined types were placed in this encounter.   Patient Instructions  Asthma Stop Breo 200 mcg  Restart Advair 230/21- 2 puffs twice a day with a spacer to help prevent cough and wheeze May use albuterol 2 puffs every 4 hours as needed for coughing, wheezing, tightness in chest or shortness of breath.  Also may use albuterol 2 puffs 5 to 15 minutes prior to exercise. Asthma control goals:   Full participation in all desired activities (may need albuterol before activity)  Albuterol use two time or less a week on average (not counting use with activity)  Cough interfering with sleep two time or less a month  Oral steroids no more than once a year  No hospitalizations Refer to pulmonology due to possible restrictive lung disease Chronic rhinitis Continue saline nasal rinse as needed for nasal symptoms.  Continue Dymista nasal spray-using 2 sprays each nostril twice a day to help with nasal congestion and runny nose.   Reflux Continue Protonix once a day to help with reflux. Continue dietary and lifestyle modifications  Please let us know if this treatment plan is not working well for you. Schedule follow up appointment in 4 weeks with Dr. Ernst Bowler      Return in about 4 weeks (around 12/19/2020), or if symptoms worsen or fail to improve.    Thank you for the  opportunity to care for this patient.  Please do not hesitate to contact me with questions.  Althea Charon, FNP [facility] [place]

## 2020-11-22 NOTE — Progress Notes (Signed)
Referral placed to the [facility].  I will call them on Monday to get the patient scheduled as a New Patient with Dr Elsworth Soho.  Thanks

## 2020-11-26 NOTE — Progress Notes (Signed)
Patient is scheduled to see Dr Elsworth Soho on 01/18/21 in the [facility].  Patient has been informed of this information.  [facility] [place],  [place]  [address] Main: [phone]

## 2020-12-21 ENCOUNTER — Ambulatory Visit (INDEPENDENT_AMBULATORY_CARE_PROVIDER_SITE_OTHER): Payer: Medicare Other | Admitting: Allergy & Immunology

## 2020-12-21 ENCOUNTER — Encounter: Payer: Self-pay | Admitting: Allergy & Immunology

## 2020-12-21 ENCOUNTER — Other Ambulatory Visit: Payer: Self-pay

## 2020-12-21 VITALS — BP 140/80 | HR 72 | Temp 97.2°F | Resp 16 | Ht 67.0 in | Wt 182.2 lb

## 2020-12-21 DIAGNOSIS — R0602 Shortness of breath: Secondary | ICD-10-CM | POA: Diagnosis not present

## 2020-12-21 DIAGNOSIS — J4540 Moderate persistent asthma, uncomplicated: Secondary | ICD-10-CM

## 2020-12-21 DIAGNOSIS — J310 Chronic rhinitis: Secondary | ICD-10-CM

## 2020-12-21 DIAGNOSIS — K219 Gastro-esophageal reflux disease without esophagitis: Secondary | ICD-10-CM

## 2020-12-21 MED ORDER — METHYLPREDNISOLONE ACETATE 40 MG/ML IJ SUSP
40.0000 mg | Freq: Once | INTRAMUSCULAR | Status: AC
  Administered 2020-12-21: 40 mg via INTRAMUSCULAR

## 2020-12-21 MED ORDER — ALBUTEROL SULFATE HFA 108 (90 BASE) MCG/ACT IN AERS: INHALATION_SPRAY | RESPIRATORY_TRACT | 1 refills | Status: DC

## 2020-12-21 MED ORDER — FLUCONAZOLE 100 MG PO TABS: 100.0000 mg | ORAL_TABLET | Freq: Every day | ORAL | 0 refills | Status: AC

## 2020-12-21 NOTE — Progress Notes (Signed)
FOLLOW UP  Date of Service/Encounter:  12/21/20   Assessment:   Moderate persistent asthma, uncomplicated with worsening lung function over the last 6 months or so  Chronic rhinitis  Gastroesophageal reflux disease- on PPI   Caitlin Tanner continues to do well symptomatically, but her lung function continues to worsen.  We are going to stop the Advair temporarily and start Breztri a triple therapy 2 puffs twice daily.  We gave her a Depo-Medrol injection.  We reviewed the spacer technique.  She has been using it inappropriately and has been excited when it was loose.  However, that is just the feedback to provide to the patient when they are inhaling too quickly.  She seems to understand this at this point.  We are going to get a chest CT out of an abundance of caution to make sure we are not missing an interstitial lung disease or some other intrapulmonary issue.  This was ordered today and we will get it scheduled next week at [facility].  Plan/Recommendations:   Asthma - Lung testing is 50% today.  - Stop the Advair temporarily and start the Breztri two puffs twice daily (contains THREE medications to help with your breathing).  - Spacer teaching reviewed (NO whistling). - DepoMedrol injection provided.  - Use Breztri two puffs twice daily with a spacer.  Asthma control goals:   Full participation in all desired activities (may need albuterol before activity)  Albuterol use two time or less a week on average (not counting use with activity)  Cough interfering with sleep two time or less a month  Oral steroids no more than once a year  No hospitalizations Refer to pulmonology due to possible restrictive lung disease  Chronic rhinitis - Continue saline nasal rinse as needed for nasal symptoms. - Continue Dymista nasal spray-using 2 sprays each nostril twice a day to help with nasal congestion and runny nose. - Restart loratadine 10mg  once daily.  - Consider retesting in the  future - ? next visit   Reflux - Continue Protonix once a day to help with reflux. - Continue dietary and lifestyle modifications  Return in about 6 weeks (around 02/01/2021).   Subjective:   Caitlin Tanner is a 70 y.o. female presenting today for follow up of  Chief Complaint  Patient presents with  . Asthma    [name] has a history of the following: Patient Active Problem List   Diagnosis Date Noted  . Dyspepsia 06/09/2019  . Hematochezia 06/09/2019  . Moderate persistent asthma, uncomplicated 79/89/2119  . Acute non-recurrent maxillary sinusitis 03/11/2019  . Chronic rhinitis 03/11/2019  . Nausea without vomiting 07/08/2013  . Need for prophylactic vaccination and inoculation against influenza 06/12/2013  . Well controlled persistent asthma 06/12/2013  . Multiple lung nodules 06/12/2013  . Abdominal bloating 11/24/2011  . Left lower quadrant pain 10/22/2011  . HEMORRHOIDS, INTERNAL 05/29/2009  . GERD 05/29/2009  . HEMATOCHEZIA 05/29/2009  . ANXIETY NEUROSIS 05/28/2009  . IRRITABLE BOWEL SYNDROME 05/28/2009  . DIARRHEA 05/28/2009  . ALLERGY 05/28/2009    History obtained from: chart review and patient.  [name] is a 70 y.o. female presenting for a follow up visit. She was last seen in February 2022.  She was seen by Althea Charon, her nurse practitioner.  At that time, she did not feel that the [facility] was doing much so she was restarted on Advair 230/21 mcg 2 puffs twice daily.  For her rhinitis, she was continued on nasal saline rinses  as well as Dymista 2 sprays per nostril twice daily.  Her reflux was under control with Protonix daily as well as dietary changes.  However, her spirometry continues to look terrible.  Since the last visit, she did have an allergy flare. She was placed on prednisone and Augmentin. She has finished all of this and does feel somewhat better. She was on nystatin as well because she had yeast infection in her throat.  She is wondering if she  continues to have that I would like me to look in her mouth.  Since last visit, she has done well.  She is quite bubbly today and interactive.  Asthma/Respiratory Symptom History: She is currently on Advair 230/21 two puffs twice daily. She is making it whistle which she thought was the correct way. She has been using her rescue inhaler three times since this has been going on. She apparently was supposed to be on Qvar, but she was never given this script at all. She is not using her rescue inhaler much at all. She thinks that the Advair is doing better than the [facility]. She could not feel anything with the [facility]. Most of the time, she is active with lots of energy.  She is open to doing other diagnostic modalities to see if she can figure out and treat her decreased lung volumes.  Allergic Rhinitis Symptom History: She remains on Dymista 1 spray per nostril up to twice daily.  She has needed the antibiotics as above.  Overall, she feels fairly good.  Otherwise, there have been no changes to her past medical history, surgical history, family history, or social history.    Review of Systems  Constitutional: Negative.  Negative for chills, fever, malaise/fatigue and weight loss.  HENT: Negative for congestion, ear discharge, ear pain and sinus pain.   Eyes: Negative for pain, discharge and redness.  Respiratory: Positive for cough. Negative for sputum production, shortness of breath and wheezing.   Cardiovascular: Negative.  Negative for chest pain and palpitations.  Gastrointestinal: Negative for abdominal pain, constipation, diarrhea, heartburn, nausea and vomiting.  Skin: Negative.  Negative for itching and rash.  Neurological: Negative for dizziness and headaches.  Endo/Heme/Allergies: Positive for environmental allergies. Does not bruise/bleed easily.       Objective:   Blood pressure 140/80, pulse 72, temperature (!) 97.2 F (36.2 C), temperature source Temporal, resp. rate 16, height 5'  7" (1.702 m), weight 182 lb 3.2 oz (82.6 kg), SpO2 97 %. Body mass index is 28.54 kg/m.   Physical Exam:  Physical Exam Constitutional:      Appearance: She is well-developed.     Comments: Very friendly and smiling.  HENT:     Head: Normocephalic and atraumatic.     Right Ear: Tympanic membrane, ear canal and external ear normal.     Left Ear: Tympanic membrane, ear canal and external ear normal. No decreased hearing noted.     Nose: No nasal deformity, septal deviation, mucosal edema or rhinorrhea.     Right Turbinates: Enlarged and swollen.     Left Turbinates: Enlarged and swollen.     Right Sinus: No maxillary sinus tenderness or frontal sinus tenderness.     Left Sinus: No maxillary sinus tenderness or frontal sinus tenderness.     Mouth/Throat:     Mouth: Mucous membranes are not pale and not dry.     Pharynx: Uvula midline.  Eyes:     General:        Right eye:  No discharge.        Left eye: No discharge.     Conjunctiva/sclera: Conjunctivae normal.     Right eye: Right conjunctiva is not injected. No chemosis.    Left eye: Left conjunctiva is not injected. No chemosis.    Pupils: Pupils are equal, round, and reactive to light.  Cardiovascular:     Rate and Rhythm: Normal rate and regular rhythm.     Heart sounds: Normal heart sounds.  Pulmonary:     Effort: Pulmonary effort is normal. No tachypnea, accessory muscle usage or respiratory distress.     Breath sounds: Normal breath sounds. No wheezing, rhonchi or rales.     Comments: Somewhat decreased air movement at the bases.  No crackles or wheezes. Chest:     Chest wall: No tenderness.  Lymphadenopathy:     Cervical: No cervical adenopathy.  Skin:    Coloration: Skin is not pale.     Findings: No abrasion, erythema, petechiae or rash. Rash is not papular, urticarial or vesicular.  Neurological:     Mental Status: She is alert.      Diagnostic studies:    Spirometry: results abnormal (FEV1: 1.24/50%, FVC:  1.66/50%, FEV1/FVC: 75%).    Spirometry consistent with possible restrictive disease.   Allergy Studies: none       Salvatore Marvel, MD  [facility] [place] [facility] [place]

## 2020-12-21 NOTE — Patient Instructions (Addendum)
Asthma - Lung testing is 50% today.  - Stop the Advair temporarily and start the Breztri two puffs twice daily (contains THREE medications to help with your breathing).  - Spacer teaching reviewed (NO whistling). - DepoMedrol injection provided.  - Use Breztri two puffs twice daily with a spacer.  Asthma control goals:   Full participation in all desired activities (may need albuterol before activity)  Albuterol use two time or less a week on average (not counting use with activity)  Cough interfering with sleep two time or less a month  Oral steroids no more than once a year  No hospitalizations Refer to pulmonology due to possible restrictive lung disease  Chronic rhinitis - Continue saline nasal rinse as needed for nasal symptoms. - Continue Dymista nasal spray-using 2 sprays each nostril twice a day to help with nasal congestion and runny nose. - Restart loratadine 10mg  once daily.  - Consider retesting in the future - ? next visit   Reflux - Continue Protonix once a day to help with reflux. - Continue dietary and lifestyle modifications  Return in about 6 weeks (around 02/01/2021).    Please inform us of any Emergency Department visits, hospitalizations, or changes in symptoms. Call us before going to the ED for breathing or allergy symptoms since we might be able to fit you in for a sick visit. Feel free to contact us anytime with any questions, problems, or concerns.  It was a pleasure to see you again today!  Websites that have reliable patient information: 1. American Academy of Asthma, Allergy, and Immunology: www.aaaai.org 2. Food Allergy Research and Education (FARE): foodallergy.org 3. Mothers of Asthmatics: http://www.asthmacommunitynetwork.org 4. American College of Allergy, Asthma, and Immunology: www.acaai.org   COVID-19 Vaccine Information can be found at: ShippingScam.co.uk For questions related to  vaccine distribution or appointments, please email [email or web address] or call [phone].   We realize that you might be concerned about having an allergic reaction to the COVID19 vaccines. To help with that concern, WE ARE OFFERING THE COVID19 VACCINES IN OUR OFFICE! Ask the front desk for dates!     "Like" [place] on Facebook and Instagram for our latest updates!      A healthy democracy works best when [employer] participate! Make sure you are registered to vote! If you have moved or changed any of your contact information, you will need to get this updated before voting!  In some cases, you MAY be able to register to vote online: [email or web address]

## 2020-12-24 ENCOUNTER — Encounter: Payer: Self-pay | Admitting: Allergy & Immunology

## 2020-12-26 ENCOUNTER — Telehealth: Payer: Self-pay | Admitting: *Deleted

## 2020-12-26 NOTE — Telephone Encounter (Signed)
Initiated PA for Chest CT without Contrast at [phone] and it was approved. Approval number [id number]. Called and spoke with Forestine Na and scheduled the patient for a Chest CT on April 27th at 9:00am and the patient needs to arrive at 8:45am. Called the patient and advised of the appointment, patient verbalized understanding and will arrive at that time and place.

## 2020-12-26 NOTE — Progress Notes (Signed)
Currently working on this.

## 2021-01-04 ENCOUNTER — Other Ambulatory Visit: Payer: Self-pay | Admitting: Family

## 2021-01-04 ENCOUNTER — Telehealth: Payer: Self-pay

## 2021-01-04 ENCOUNTER — Other Ambulatory Visit: Payer: Self-pay | Admitting: Allergy & Immunology

## 2021-01-04 NOTE — Telephone Encounter (Signed)
Left a message for patient to call the office in regards to this matter. 

## 2021-01-04 NOTE — Telephone Encounter (Signed)
Patient called to see if Dr Ernst Bowler is wanting her to go back to the Advair as she is at the end of the sample for [facility]. If so patient will need a refill sent in for either inhaler.  Please Advise.

## 2021-01-04 NOTE — Telephone Encounter (Signed)
Can someone ask whether she felt that the [place] worked any better than the Advair? If so, we can send in [place].  Salvatore Marvel, MD [facility] [place] [facility] [place]

## 2021-01-07 NOTE — Telephone Encounter (Signed)
Left a message for patient to return the call regarding how Caitlin Tanner is working for her.

## 2021-01-07 NOTE — Telephone Encounter (Signed)
Great!  Thanks for the update.  She is scheduled for a chest CT as well I believe.  I am rounding to Chrissie since she has adopted this patient.  Salvatore Marvel, MD [facility] [place] [facility] [place]

## 2021-01-07 NOTE — Telephone Encounter (Signed)
Thank you. Noted.

## 2021-01-07 NOTE — Telephone Encounter (Signed)
Patient called back stating her breathing has improved some with the Breztri, not 100%. Patient did receive a 3 month supply of Advair over the weekend. She wants to try and finish that out since its paid for. Patient states she will call us if her breathing gets worse before her next visit in May.   Thanks

## 2021-01-08 ENCOUNTER — Ambulatory Visit (INDEPENDENT_AMBULATORY_CARE_PROVIDER_SITE_OTHER): Payer: Medicare Other | Admitting: Pulmonary Disease

## 2021-01-08 ENCOUNTER — Other Ambulatory Visit: Payer: Self-pay

## 2021-01-08 ENCOUNTER — Encounter: Payer: Self-pay | Admitting: Pulmonary Disease

## 2021-01-08 DIAGNOSIS — J984 Other disorders of lung: Secondary | ICD-10-CM | POA: Diagnosis not present

## 2021-01-08 DIAGNOSIS — J4540 Moderate persistent asthma, uncomplicated: Secondary | ICD-10-CM

## 2021-01-08 NOTE — Progress Notes (Signed)
Subjective:    Patient ID: Caitlin Tanner, female    DOB: Jul 03, 1951, 70 y.o.   MRN: [id number]  HPI  Caitlin Tanner is a 70 year old remote smoker with asthma who is referred by the allergist for evaluation of "restrictive lung disease" She was evaluated in 2015 at the pulmonary clinic for multiple nodules which were noted to be benign She reports asthma onset in her 31s with repeated attacks of asthmatic bronchitis especially in the spring and fall.  She was under the care of Dr. Neldon Mc initially and now with Dr. Ernst Bowler.  She has been on steroid inhalers for more than 10 years, initially [place] and Advair and now [employer]. She had a flareup in March with coughing wheezing and productive cough, was treated by her PCP in [place] with antibiotics, cough medications and prednisone.  She then saw Dr. Ernst Bowler and received IM Medrol and another prednisone taper and was changed to [facility].  This is finally worked and she feels like she is 80% close to her baseline.  She denies dyspnea on exertion, in fact she walks for half a mile daily to her sister's house.  She denies cough or wheezing at baseline or nocturnal symptoms  Review of her serial spirometry shows FEV1 ratio more than 70, with FEV1 ranging from 50 to 63% suggesting moderate to severe restriction  She is a remote smoker but smoked less than 5 pack years before she quit in 1989  Chest x-ray 05/2020 was independently reviewed which shows mild hyperinflation, no evidence of ILD She lives in a mobile home with carpets, she had 2 [place] who died a few years ago  Significant tests/ events reviewed Spirometry: 11/2020 FEV1: 1.24/50%, FVC: 1.66/50%, FEV1/FVC: 75 02/2020 spirometry ratio 66, FEV1 1.14/44% 07/2012 FEV1 was 1.4 L/54% predicted.  alpha-1 antitrypsin and MM type and normal levels.   IgE 206  05/2015 CT chest Stable bilateral pulmonary nodularity appear benign.  Stable mild bronchiectasis and linear scarring at the lung bases. Compared to  02/2014   Past Medical History:  Diagnosis Date  . Anxiety disorder   . Asthma   . Borderline diabetes   . Diverticulosis   . GERD (gastroesophageal reflux disease)   . Hemorrhoids   . Hypertension   . IBS (irritable bowel syndrome)   . S/P colonoscopy 12/23/10   Dr Rourk-diverticulosis, anal canal hemorhhoids, anal papilla, random bx benign  . S/P endoscopy 12/23/10   Dr Girard Cooter, tubular esophagus, sm HH, minimal retained food, bile-stained mucus, otherwise normal,, esophageal bx  benign    Past Surgical History:  Procedure Laterality Date  . CHOLECYSTECTOMY    . cold knife conization    . COLONOSCOPY  06/08/2006   internal hemorrhoids otherwise noraml colon and rectum  . COLONOSCOPY  11/2010   Dr. Gala Romney: Anal canal hemorrhoids, diverticulosis, random colon biopsies unremarkable.  . COLONOSCOPY WITH PROPOFOL N/A 12/15/2019   Procedure: COLONOSCOPY WITH PROPOFOL;  Surgeon: Daneil Dolin, MD;  [facility];  Service: Endoscopy;  Laterality: N/A;  9:00am  . egd/tcs  11/2010   see PMH  . ESOPHAGOGASTRODUODENOSCOPY  06/08/2006   single distal erosion otherwise normal  . ESOPHAGOGASTRODUODENOSCOPY  11/2010   Dr. Gala Romney: Longitudinal for reading of the tubular esophagus, no evidence of eosinophilic esophagitis on biopsy, small hiatal hernia, minimal amount of retained food, bile-stained mucosa.  . ESOPHAGOGASTRODUODENOSCOPY (EGD) WITH PROPOFOL N/A 12/15/2019   Procedure: ESOPHAGOGASTRODUODENOSCOPY (EGD) WITH PROPOFOL;  Surgeon: Daneil Dolin, MD;  Location[facility];  Service:  Endoscopy;  Laterality: N/A;  . left great toe surgery    . POLYPECTOMY  12/15/2019   Procedure: POLYPECTOMY;  Surgeon: Daneil Dolin, MD;  Location: [facility];  Service: Endoscopy;;  colon  . TONSILLECTOMY    . WRIST FRACTURE SURGERY  07/2013   right    Allergies  Allergen Reactions  . Conj Estrog-Medroxyprogest Ace     REACTION: UNKNOWN REACTION  . Ketorolac Tromethamine     Felt  like she had "bugs in her head"  . Sulfa Antibiotics Nausea And Vomiting  . Tramadol Hcl Nausea Only    Social History   Socioeconomic History  . Marital status: Married    Spouse name: Not on file  . Number of children: 1  . Years of education: Not on file  . Highest education level: Not on file  Occupational History    Employer: UNEMPLOYED  Tobacco Use  . Smoking status: Former Smoker    Packs/day: 0.10    Years: 3.00    Pack years: 0.30    Types: Cigarettes    Quit date: 09/30/1987    Years since quitting: 33.2  . Smokeless tobacco: Never Used  . Tobacco comment: pt states she smoked socially  Vaping Use  . Vaping Use: Never used  Substance and Sexual Activity  . Alcohol use: No  . Drug use: No  . Sexual activity: Not on file  Other Topics Concern  . Not on file  Social History Narrative  . Not on file   Social Determinants of Health   Financial Resource Strain: Not on file  Food Insecurity: Not on file  Transportation Needs: Not on file  Physical Activity: Not on file  Stress: Not on file  Social Connections: Not on file  Intimate Partner Violence: Not on file    Family History  Problem Relation Age of Onset  . Stomach cancer Father 65  . Throat cancer Mother 24  . Diabetes Brother   . Transient ischemic attack Brother   . Colon cancer Neg Hx   . Allergic rhinitis Neg Hx   . Angioedema Neg Hx   . Asthma Neg Hx   . Atopy Neg Hx   . Eczema Neg Hx   . Immunodeficiency Neg Hx   . Urticaria Neg Hx      Review of Systems  Constitutional: negative for anorexia, fevers and sweats  Eyes: negative for irritation, redness and visual disturbance  Ears, nose, mouth, throat, and face: negative for earaches, epistaxis, nasal congestion and sore throat  Respiratory: negative for cough,  sputum and wheezing  Cardiovascular: negative for chest pain, lower extremity edema, orthopnea, palpitations and syncope  Gastrointestinal: negative for abdominal pain,  constipation, diarrhea, melena, nausea and vomiting  Genitourinary:negative for dysuria, frequency and hematuria  Hematologic/lymphatic: negative for bleeding, easy bruising and lymphadenopathy  Musculoskeletal:negative for arthralgias, muscle weakness and stiff joints  Neurological: negative for coordination problems, gait problems, headaches and weakness  Endocrine: negative for diabetic symptoms including polydipsia, polyuria and weight loss     Objective:   Physical Exam  Gen. Pleasant, well-nourished, elderly, in no distress, normal affect ENT - no pallor,icterus, no post nasal drip Neck: No JVD, no thyromegaly, no carotid bruits Lungs: no use of accessory muscles, no dullness to percussion, clear without rales or rhonchi  Cardiovascular: Rhythm regular, heart sounds  normal, no murmurs or gallops, no peripheral edema Abdomen: soft and non-tender, no hepatosplenomegaly, BS normal. Musculoskeletal: No deformities, no cyanosis or clubbing Neuro:  alert, non focal       Assessment & Plan:

## 2021-01-08 NOTE — Assessment & Plan Note (Signed)
Cause is unclear at this time.  Main concern here would be ILD, prior CT scan in 2016 did not show any evidence of ILD and last chest x-ray 05/2020 does not show significant ILD. I will proceed with high-resolution CT chest and this has been arranged for 4/27 Multiple nodules noted on prior CT suggested benign etiology, but if bronchiectasis is present, that raises the question of MAC infection, we will look for disease progression on HRCT

## 2021-01-08 NOTE — Patient Instructions (Signed)
  Await CT scan results from 4/27 OK to stay on advair after you are done with [facility]

## 2021-01-08 NOTE — Assessment & Plan Note (Signed)
There is 1 spirometry in the past which does show a low FEV1 ratio, mild variability in FEV1. Reasonable to treat as asthma -Advair should suffice unless she has frequent exacerbations, in which case it can be stepped up to [facility]

## 2021-01-23 ENCOUNTER — Ambulatory Visit (HOSPITAL_COMMUNITY)
Admission: RE | Admit: 2021-01-23 | Discharge: 2021-01-23 | Disposition: A | Discharge status: Home / Self Care | Payer: Medicare Other | Source: Ambulatory Visit | Attending: Allergy & Immunology | Admitting: Allergy & Immunology

## 2021-01-23 DIAGNOSIS — R0602 Shortness of breath: Secondary | ICD-10-CM | POA: Diagnosis present

## 2021-01-25 ENCOUNTER — Telehealth: Payer: Self-pay | Admitting: Pulmonary Disease

## 2021-01-25 DIAGNOSIS — R911 Solitary pulmonary nodule: Secondary | ICD-10-CM

## 2021-01-25 NOTE — Telephone Encounter (Signed)
copied from 4/27 CT:  -------------------- we can let patient know that no fibrosis was noted. Mild airway damage was present. There was a new nodule in the left upper lung 6 mm which was not present on previous CT in 2016. She is a very remote smoker less than 5 pack years so I think the risk for malignancy is very low, but we should do our due diligence and follow-up with another CT chest without contrast in 6 months. Jennifer,Please schedule if patient willing -----------------------  Spoke with pt, aware of results/recs. Follow up CT ordered. Nothing further needed at this time- will close encounter.

## 2021-01-31 NOTE — Progress Notes (Signed)
Noted! Thank you

## 2021-02-06 ENCOUNTER — Ambulatory Visit (INDEPENDENT_AMBULATORY_CARE_PROVIDER_SITE_OTHER): Payer: Medicare Other | Admitting: Allergy & Immunology

## 2021-02-06 ENCOUNTER — Other Ambulatory Visit: Payer: Self-pay

## 2021-02-06 ENCOUNTER — Encounter: Payer: Self-pay | Admitting: Allergy & Immunology

## 2021-02-06 VITALS — BP 136/88 | HR 80 | Temp 97.0°F | Resp 16 | Ht 67.0 in | Wt 184.8 lb

## 2021-02-06 DIAGNOSIS — J310 Chronic rhinitis: Secondary | ICD-10-CM

## 2021-02-06 DIAGNOSIS — J4540 Moderate persistent asthma, uncomplicated: Secondary | ICD-10-CM

## 2021-02-06 MED ORDER — ALBUTEROL SULFATE HFA 108 (90 BASE) MCG/ACT IN AERS: INHALATION_SPRAY | RESPIRATORY_TRACT | 1 refills | Status: DC

## 2021-02-06 MED ORDER — BREZTRI AEROSPHERE 160-9-4.8 MCG/ACT IN AERO: 2.0000 | INHALATION_SPRAY | Freq: Two times a day (BID) | RESPIRATORY_TRACT | 5 refills | Status: AC

## 2021-02-06 MED ORDER — PANTOPRAZOLE SODIUM 40 MG PO TBEC: 40.0000 mg | DELAYED_RELEASE_TABLET | Freq: Every day | ORAL | 0 refills | Status: DC
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
## 2021-02-06 NOTE — Patient Instructions (Addendum)
Asthma - Lung testing is around 65% today and slowly improving, which is great. - We are going to continue with the Breztri two puffs twice daily (contains THREE medications to help with your breathing).  - I sent in the prescription for that medication.  - The Judithann Sauger will replace your Advair.  - Spacer use reviewed. - Daily controller medication(s): Breztri two puffs twice daily with spacer - Prior to physical activity: albuterol 2 puffs 10-15 minutes before physical activity. - Rescue medications: albuterol 4 puffs every 4-6 hours as needed - Asthma control goals:  * Full participation in all desired activities (may need albuterol before activity) * Albuterol use two time or less a week on average (not counting use with activity) * Cough interfering with sleep two time or less a month * Oral steroids no more than once a year * No hospitalizations  Chronic rhinitis - Continue saline nasal rinse as needed for nasal symptoms. - Continue Dymista nasal spray-using 2 sprays each nostril twice a day to help with nasal congestion and runny nose. - Continue with loratadine 10mg  once daily.   Reflux - Continue Protonix once a day to help with reflux. - Continue dietary and lifestyle modifications  Return in about 3 months (around 05/09/2021).    Please inform us of any Emergency Department visits, hospitalizations, or changes in symptoms. Call us before going to the ED for breathing or allergy symptoms since we might be able to fit you in for a sick visit. Feel free to contact us anytime with any questions, problems, or concerns.  It was a pleasure to see you again today!  Websites that have reliable patient information: 1. American Academy of Asthma, Allergy, and Immunology: www.aaaai.org 2. Food Allergy Research and Education (FARE): foodallergy.org 3. Mothers of Asthmatics: http://www.asthmacommunitynetwork.org 4. American College of Allergy, Asthma, and Immunology:  www.acaai.org   COVID-19 Vaccine Information can be found at: ShippingScam.co.uk For questions related to vaccine distribution or appointments, please email [email or web address] or call [phone].   We realize that you might be concerned about having an allergic reaction to the COVID19 vaccines. To help with that concern, WE ARE OFFERING THE COVID19 VACCINES IN OUR OFFICE! Ask the front desk for dates!     "Like" [place] on Facebook and Instagram for our latest updates!      A healthy democracy works best when [employer] participate! Make sure you are registered to vote! If you have moved or changed any of your contact information, you will need to get this updated before voting!  In some cases, you MAY be able to register to vote online: [email or web address]

## 2021-02-07 ENCOUNTER — Encounter: Payer: Self-pay | Admitting: Allergy & Immunology

## 2021-02-28 ENCOUNTER — Other Ambulatory Visit: Payer: Self-pay | Admitting: Gastroenterology

## 2021-05-10 ENCOUNTER — Other Ambulatory Visit: Payer: Self-pay

## 2021-05-10 ENCOUNTER — Encounter: Payer: Self-pay | Admitting: Allergy & Immunology

## 2021-05-10 ENCOUNTER — Ambulatory Visit (INDEPENDENT_AMBULATORY_CARE_PROVIDER_SITE_OTHER): Payer: Medicare Other | Admitting: Allergy & Immunology

## 2021-05-10 VITALS — BP 140/78 | HR 64 | Temp 98.2°F | Resp 16 | Ht 65.0 in | Wt 186.8 lb

## 2021-05-10 DIAGNOSIS — J310 Chronic rhinitis: Secondary | ICD-10-CM | POA: Diagnosis not present

## 2021-05-10 DIAGNOSIS — R0602 Shortness of breath: Secondary | ICD-10-CM | POA: Diagnosis not present

## 2021-05-10 DIAGNOSIS — K219 Gastro-esophageal reflux disease without esophagitis: Secondary | ICD-10-CM | POA: Diagnosis not present

## 2021-05-10 DIAGNOSIS — J4540 Moderate persistent asthma, uncomplicated: Secondary | ICD-10-CM | POA: Diagnosis not present

## 2021-05-10 NOTE — Patient Instructions (Addendum)
Asthma - Lung testing is around 63% today which is stable. - We are not going to make any changes at this time.  - Get the blood work and we can see which injectable medication we will go with.  - This will help [place] go back to Advair since that is cheaper.  - Spacer use reviewed. - Daily controller medication(s): Breztri two puffs twice daily with spacer - Prior to physical activity: albuterol 2 puffs 10-15 minutes before physical activity. - Rescue medications: albuterol 4 puffs every 4-6 hours as needed - Asthma control goals:  * Full participation in all desired activities (may need albuterol before activity) * Albuterol use two time or less a week on average (not counting use with activity) * Cough interfering with sleep two time or less a month * Oral steroids no more than once a year * No hospitalizations  Chronic rhinitis - Continue saline nasal rinse as needed for nasal symptoms. - Continue Dymista nasal spray-using 2 sprays each nostril twice a day to help with nasal congestion and runny nose. - Continue with loratadine '10mg'$  once daily AS NEEDED.    Reflux - Continue Protonix once a day to help with reflux. - Continue dietary and lifestyle modifications  Return in about 6 months (around 11/10/2021).    Please inform us of any Emergency Department visits, hospitalizations, or changes in symptoms. Call us before going to the ED for breathing or allergy symptoms since we might be able to fit you in for a sick visit. Feel free to contact us anytime with any questions, problems, or concerns.  It was a pleasure to see you again today!  Websites that have reliable patient information: 1. American Academy of Asthma, Allergy, and Immunology: www.aaaai.org 2. Food Allergy Research and Education (FARE): foodallergy.org 3. Mothers of Asthmatics: http://www.asthmacommunitynetwork.org 4. American College of Allergy, Asthma, and Immunology: www.acaai.org   COVID-19 Vaccine Information  can be found at: ShippingScam.co.uk For questions related to vaccine distribution or appointments, please email [email or web address] or call [phone].   We realize that you might be concerned about having an allergic reaction to the COVID19 vaccines. To help with that concern, WE ARE OFFERING THE COVID19 VACCINES IN OUR OFFICE! Ask the front desk for dates!     "Like" [place] on Facebook and Instagram for our latest updates!      A healthy democracy works best when [employer] participate! Make sure you are registered to vote! If you have moved or changed any of your contact information, you will need to get this updated before voting!  In some cases, you MAY be able to register to vote online: [email or web address]

## 2021-05-10 NOTE — Progress Notes (Signed)
FOLLOW UP  Date of Service/Encounter:  05/10/21   Assessment:   Moderate persistent asthma, uncomplicated with worsening lung function over the last 6 months or so   Chronic rhinitis   Gastroesophageal reflux disease - on PPI  Left upper lobe lung nodule - needs repeat CT scan in April 2023 (follows with Dr. Elsworth Soho)  Recent COVID-19 infection in July 2022 - treated with Paxlovid   Shunte is overall doing very well.  Her breathing is very stable.  Although it has not normalized, and has remained stable since last visit which is impressive.  She is having trouble affording her Judithann Sauger which is $75 each month.  She would like to go back to the Advair.  However, I do not want to lose anything that we have lost lung function wise.  I did recommend that she get her labs so that we can see if she will qualify for an injectable asthma medication.  Hopefully we would be able to get that covered by the company [employer].  We will make next decisions once we get the blood work.   Plan/Recommendations:   Asthma - Lung testing is around 63% today which is stable. - We are not going to make any changes at this time.  - Get the blood work and we can see which injectable medication we will go with.  - This will help [place] go back to Advair since that is cheaper.  - Spacer use reviewed. - Daily controller medication(s): Breztri two puffs twice daily with spacer - Prior to physical activity: albuterol 2 puffs 10-15 minutes before physical activity. - Rescue medications: albuterol 4 puffs every 4-6 hours as needed - Asthma control goals:  * Full participation in all desired activities (may need albuterol before activity) * Albuterol use two time or less a week on average (not counting use with activity) * Cough interfering with sleep two time or less a month * Oral steroids no more than once a year * No hospitalizations  Chronic rhinitis - Continue saline nasal rinse as needed for nasal  symptoms. - Continue Dymista nasal spray-using 2 sprays each nostril twice a day to help with nasal congestion and runny nose. - Continue with loratadine '10mg'$  once daily AS NEEDED.    Reflux - Continue Protonix once a day to help with reflux. - Continue dietary and lifestyle modifications  Return in about 6 months (around 11/10/2021).    Subjective:   PAIDYN ASFOUR is a 70 y.o. female presenting today for follow up of  Chief Complaint  Patient presents with   Asthma    SHAWONA KOMPERDA has a history of the following: Patient Active Problem List   Diagnosis Date Noted   Restrictive lung disease 01/08/2021   Dyspepsia 06/09/2019   Hematochezia 06/09/2019   Moderate persistent asthma, uncomplicated [id number]   Acute non-recurrent maxillary sinusitis 03/11/2019   Chronic rhinitis 03/11/2019   Nausea without vomiting 07/08/2013   Need for prophylactic vaccination and inoculation against influenza 06/12/2013   Well controlled persistent asthma 06/12/2013   Multiple lung nodules 06/12/2013   Abdominal bloating 11/24/2011   Left lower quadrant pain 10/22/2011   HEMORRHOIDS, INTERNAL 05/29/2009   GERD 05/29/2009   HEMATOCHEZIA 05/29/2009   ANXIETY NEUROSIS 05/28/2009   IRRITABLE BOWEL SYNDROME 05/28/2009   DIARRHEA 05/28/2009   ALLERGY 05/28/2009    History obtained from: chart review and patient.  Kemper is a 70 y.o. female presenting for a follow up visit.  She was last seen  in May 2022.  At that time, her lung testing was around 65% and improving.  We continued her on Breztri 2 puffs twice daily.  Prior to that visit, she had undergone several months of recurrent prednisone burst as well as Depo-Medrol injections.  Since the last visit, she has mostly done well. She was diagnosed with COVID in July 2022. She was diagnosed with a sinus infection and ear infection. She did have a COVID test that was positive and she had a CXR.   Asthma/Respiratory Symptom History: She remains on  the Breztri 2 puffs twice daily.  She has not been using her rescue inhaler much at all.  Overall, she feels that her breathing is under good control.  However, it should be noted that she has always felt that her breathing is under fairly good control.  In any case, her breathing function never really match her clinical reporting of her symptoms. Her Judithann Sauger is $75 per month, which is 50 more dollars per month and her Advair that she was on before.  She never did get the lab work from the last visit.  Allergic Rhinitis Symptom History: She has a nose spray that she uses as needed.  She has an over-the-counter antihistamine that she uses every day at as well.  She is wondering if she can use the antihistamine only on the worst days.    Otherwise, there have been no changes to her past medical history, surgical history, family history, or social history.    Review of Systems  Constitutional: Negative.  Negative for fever, malaise/fatigue and weight loss.  HENT: Negative.  Negative for congestion, ear discharge, ear pain and nosebleeds.   Eyes:  Negative for pain, discharge and redness.  Respiratory:  Positive for cough. Negative for sputum production, shortness of breath and wheezing.   Cardiovascular: Negative.  Negative for chest pain and palpitations.  Gastrointestinal:  Negative for abdominal pain, heartburn, nausea and vomiting.  Skin: Negative.  Negative for itching and rash.  Neurological:  Negative for dizziness and headaches.  Endo/Heme/Allergies:  Negative for environmental allergies. Does not bruise/bleed easily.      Objective:   Blood pressure 140/78, pulse 64, temperature 98.2 F (36.8 C), temperature source Temporal, resp. rate 16, height '5\' 5"'$  (1.651 m), weight 186 lb 12.8 oz (84.7 kg), SpO2 95 %. Body mass index is 31.09 kg/m.   Physical Exam:  Physical Exam Vitals reviewed.  Constitutional:      Appearance: She is well-developed.     Comments: Some hoarseness  today.  HENT:     Head: Normocephalic and atraumatic.     Right Ear: Tympanic membrane, ear canal and external ear normal.     Left Ear: Tympanic membrane, ear canal and external ear normal.     Nose: No nasal deformity, septal deviation, mucosal edema or rhinorrhea.     Right Turbinates: Not enlarged, swollen or pale.     Left Turbinates: Not enlarged, swollen or pale.     Right Sinus: No maxillary sinus tenderness or frontal sinus tenderness.     Left Sinus: No maxillary sinus tenderness or frontal sinus tenderness.     Mouth/Throat:     Mouth: Mucous membranes are not pale and not dry.     Pharynx: Uvula midline.  Eyes:     General: Lids are normal. No allergic shiner.       Right eye: No discharge.        Left eye: No discharge.  Conjunctiva/sclera: Conjunctivae normal.     Right eye: Right conjunctiva is not injected. No chemosis.    Left eye: Left conjunctiva is not injected. No chemosis.    Pupils: Pupils are equal, round, and reactive to light.  Cardiovascular:     Rate and Rhythm: Normal rate and regular rhythm.     Heart sounds: Normal heart sounds.  Pulmonary:     Effort: Pulmonary effort is normal. No tachypnea, accessory muscle usage or respiratory distress.     Breath sounds: Normal breath sounds. No wheezing, rhonchi or rales.     Comments: Moving air well in all lung fields.  No increased work of breathing. Chest:     Chest wall: No tenderness.  Lymphadenopathy:     Cervical: No cervical adenopathy.  Skin:    Coloration: Skin is not pale.     Findings: No abrasion, erythema, petechiae or rash. Rash is not papular, urticarial or vesicular.  Neurological:     Mental Status: She is alert.  Psychiatric:        Behavior: Behavior is cooperative.     Diagnostic studies:    Spirometry: results abnormal (FEV1: 1.36/62%, FVC: 1.81/63%, FEV1/FVC: 75%).    Spirometry consistent with possible restrictive disease.  Overall, values are stable.    Allergy Studies:  none       Salvatore Marvel, MD  [facility]

## 2021-07-15 ENCOUNTER — Ambulatory Visit (HOSPITAL_COMMUNITY): Payer: Medicare Other

## 2021-07-30 ENCOUNTER — Ambulatory Visit (HOSPITAL_COMMUNITY): Payer: Medicare Other

## 2021-08-06 NOTE — Patient Instructions (Addendum)
Asthma -Continue to follow-up with pulmonary concerning your restrictive lung disease/pulmonary nodules. - Get CT scan as ordered by pulmonary -For now and with upper respiratory infections/ asthma flares start budesonide 0.5 mg twice a day with albuterol for the next week - Daily controller medication(s): Advair two puffs twice daily with spacer - Prior to physical activity: albuterol 2 puffs 10-15 minutes before physical activity. - Rescue medications: albuterol 4 puffs every 4-6 hours as needed - Asthma control goals:  * Full participation in all desired activities (may need albuterol before activity) * Albuterol use two time or less a week on average (not counting use with activity) * Cough interfering with sleep two time or less a month * Oral steroids no more than once a year * No hospitalizations  Chronic rhinitis - Continue saline nasal rinse as needed for nasal symptoms. - Continue Dymista nasal spray-using 2 sprays each nostril twice a day to help with nasal congestion and runny nose. - Continue with loratadine 10mg  once daily AS NEEDED.    Reflux - Continue Protonix once a day to help with reflux. - Continue dietary and lifestyle modifications  Schedule a follow up appointment in 2 weeks or sooner if needed

## 2021-08-07 ENCOUNTER — Other Ambulatory Visit: Payer: Self-pay

## 2021-08-07 ENCOUNTER — Encounter: Payer: Self-pay | Admitting: Family

## 2021-08-07 ENCOUNTER — Ambulatory Visit (INDEPENDENT_AMBULATORY_CARE_PROVIDER_SITE_OTHER): Payer: Medicare Other | Admitting: Family

## 2021-08-07 VITALS — BP 120/74 | HR 72 | Temp 98.0°F | Resp 20

## 2021-08-07 DIAGNOSIS — R058 Other specified cough: Secondary | ICD-10-CM | POA: Diagnosis not present

## 2021-08-07 DIAGNOSIS — K219 Gastro-esophageal reflux disease without esophagitis: Secondary | ICD-10-CM

## 2021-08-07 DIAGNOSIS — J4540 Moderate persistent asthma, uncomplicated: Secondary | ICD-10-CM | POA: Diagnosis not present

## 2021-08-07 DIAGNOSIS — J310 Chronic rhinitis: Secondary | ICD-10-CM

## 2021-08-07 MED ORDER — BUDESONIDE 0.5 MG/2ML IN SUSP: RESPIRATORY_TRACT | 3 refills | Status: DC

## 2021-08-07 MED ORDER — ALBUTEROL SULFATE (2.5 MG/3ML) 0.083% IN NEBU: INHALATION_SOLUTION | RESPIRATORY_TRACT | 3 refills | Status: DC

## 2021-08-07 NOTE — Progress Notes (Signed)
[place], [address] [place] [address] Dept: [phone]  FOLLOW UP NOTE  Patient ID: Caitlin Tanner, female    DOB: October 18, 1950  Age: 70 y.o. MRN: [id number] Date of Office Visit: 08/07/2021  Assessment  Chief Complaint: Cough (X's 3 weeks especially at night) and Wheezing (X's 3 wks expecially at night)  HPI Caitlin Tanner is a 70 year old female who presents today for an acute visit.  She was last seen on May 10, 2021 by Dr. Ernst Bowler for moderate persistent asthma, uncomplicated with worsening lung function over the last 6 months or so, chronic rhinitis, reflux, and left upper lobe lung nodule needs repeat CT scan in December 2022-follows with Dr. Elsworth Soho.  Moderate persistent asthma is reported as not well controlled with Advair HFA 2 puffs twice a day and albuterol as needed.  She has been using her albuterol 3-4 times a week.  She reports that since October 20 she has been sick.  She reports that she was originally coughing up greenish sputum now it is white with some green.  She also reports wheezing, tightness in her chest, and nocturnal awakenings due to breathing problems.  In the beginning she had body aches but not now.  She just reports feeling exhausted.  She reports her cough and wheezing are worse at night.  She denies fever, chills, and shortness of breath.  She has been to the urgent care twice with these symptoms.  On October 24 she was placed on doxycycline and given prednisone.  She went back on October 31 and was instructed to stop the doxycycline and start azithromycin.  She was given an additional prednisone and cough syrup.  While she was on the prednisone it did help with her symptoms but would come back as soon as she finished.  Her influenza test was negative.  She reports that these medications have not really helped that much.  She did have a chest x-ray that she reports was normal.  After reviewing her chest x-ray from July 29, 2021 it shows, "small  nodular densities projecting over the right lung, suspicious for pulmonary nodules.  Recommend follow-up CT chest and further characterization.".  She reports that she was supposed to have a CT chest follow-up for Dr. Elsworth Soho, her pulmonologist, this month but did not go because of her coughing so much.  She mentions that her husband has the same symptoms.  Chronic rhinitis is reported as not well controlled with Dymista nasal spray as needed and loratadine 10 mg once a day as needed.  She reports sneezing, rhinorrhea that was green, but is now clear, nasal congestion, and postnasal drip.  She has been treated for 1 sinus infection since we last saw her.  Reflux is reported as moderately controlled with Protonix once a day.  She reports she will occasionally have heartburn depending on what she eats.   Drug Allergies:  Allergies  Allergen Reactions   Conj Estrog-Medroxyprogest Ace     REACTION: UNKNOWN REACTION   Ketorolac Tromethamine     Felt like she had "bugs in her head"   Sulfa Antibiotics Nausea And Vomiting   Tramadol Hcl Nausea Only    Review of Systems: Review of Systems  Constitutional:  Positive for malaise/fatigue. Negative for chills and fever.  HENT:         Reports clear rhinorrhea, nasal congestion, sneezing, and postnasal drip  Eyes:        Denies itchy watery eyes  Respiratory:  Positive for cough  and wheezing. Negative for shortness of breath.        Reports a cough that is productive with sometimes white with a little green in it.  Also reports wheezing, tightness in her chest, and nocturnal awakenings due to breathing problems.  Denies dyspnea  Cardiovascular:  Negative for chest pain and palpitations.  Gastrointestinal:        Reports heartburn depending on what she is eating  Genitourinary:  Negative for frequency.  Skin:  Negative for itching and rash.  Neurological:  Negative for headaches.  Endo/Heme/Allergies:  Positive for environmental allergies.     Physical Exam: BP 120/74 (BP Location: Left Arm, Patient Position: Sitting, Cuff Size: Normal)   Pulse 72   Temp 98 F (36.7 C) (Temporal)   Resp 20   SpO2 96%    Physical Exam Constitutional:      Appearance: Normal appearance.  HENT:     Head: Normocephalic and atraumatic.     Comments: Pharynx normal, eyes normal, ears normal, nose: Bilateral lower turbinates mildly edematous and slightly erythematous with no drainage noted    Right Ear: Tympanic membrane, ear canal and external ear normal.     Left Ear: Tympanic membrane, ear canal and external ear normal.     Mouth/Throat:     Mouth: Mucous membranes are moist.     Pharynx: Oropharynx is clear.  Eyes:     Conjunctiva/sclera: Conjunctivae normal.  Cardiovascular:     Rate and Rhythm: Regular rhythm.     Heart sounds: Normal heart sounds.  Pulmonary:     Effort: Pulmonary effort is normal.     Breath sounds: Normal breath sounds.     Comments: Lungs clear to auscultation Musculoskeletal:     Cervical back: Neck supple.  Skin:    General: Skin is warm.  Neurological:     Mental Status: She is alert and oriented to person, place, and time.  Psychiatric:        Mood and Affect: Mood normal.        Behavior: Behavior normal.        Thought Content: Thought content normal.        Judgment: Judgment normal.    Diagnostics: FVC 1.78 L, FEV1 1.26 L.(57%)  Predicted FVC 2.86 L, predicted FEV1 2.20 L.  Spirometry indicates possible moderately severe restriction  Assessment and Plan: 1. Not well controlled moderate persistent asthma   2. Other cough   3. Chronic rhinitis   4. Gastroesophageal reflux disease, unspecified whether esophagitis present     Meds ordered this encounter  Medications   budesonide (PULMICORT) 0.5 MG/2ML nebulizer solution    Sig: Use 1 unit dose twice a day for 7 days with upper respiratory tract infections/asthma flares    Dispense:  120 mL    Refill:  3   albuterol (PROVENTIL) (2.5  MG/3ML) 0.083% nebulizer solution    Sig: 1 unit dose via nebulizer every 4-6 hours as needed for cough, wheeze, tightness in chest, shortness of breath.    Dispense:  75 mL    Refill:  3     Patient Instructions  Asthma -Continue to follow-up with pulmonary concerning your restrictive lung disease/pulmonary nodules. - Get CT scan as ordered by pulmonary -For now and with upper respiratory infections/ asthma flares start budesonide 0.5 mg twice a day with albuterol for the next week - Daily controller medication(s): Advair two puffs twice daily with spacer - Prior to physical activity: albuterol 2 puffs 10-15 minutes before  physical activity. - Rescue medications: albuterol 4 puffs every 4-6 hours as needed - Asthma control goals:  * Full participation in all desired activities (may need albuterol before activity) * Albuterol use two time or less a week on average (not counting use with activity) * Cough interfering with sleep two time or less a month * Oral steroids no more than once a year * No hospitalizations  Chronic rhinitis - Continue saline nasal rinse as needed for nasal symptoms. - Continue Dymista nasal spray-using 2 sprays each nostril twice a day to help with nasal congestion and runny nose. - Continue with loratadine 10mg  once daily AS NEEDED.    Reflux - Continue Protonix once a day to help with reflux. - Continue dietary and lifestyle modifications  Schedule a follow up appointment in 2 weeks or sooner if needed   Return in about 2 weeks (around 08/21/2021), or if symptoms worsen or fail to improve.    Thank you for the opportunity to care for this patient.  Please do not hesitate to contact me with questions.  Althea Charon, FNP [facility]

## 2021-08-08 ENCOUNTER — Telehealth: Payer: Self-pay | Admitting: Family

## 2021-08-08 MED ORDER — BUDESONIDE 0.5 MG/2ML IN SUSP: RESPIRATORY_TRACT | 3 refills | Status: DC

## 2021-08-08 NOTE — Telephone Encounter (Signed)
Pulmicort needs to be filed on part B medicare with dx code in notes to pharmacy I [facility] and dx code

## 2021-08-08 NOTE — Telephone Encounter (Signed)
Patient called and said that the [facility] needs prio auth. For the pulmicort nebulizer solution . [facility] [place] [phone].

## 2021-08-09 ENCOUNTER — Other Ambulatory Visit: Payer: Self-pay | Admitting: Allergy & Immunology

## 2021-08-12 ENCOUNTER — Telehealth: Payer: Self-pay | Admitting: Family

## 2021-08-12 MED ORDER — ALBUTEROL SULFATE HFA 108 (90 BASE) MCG/ACT IN AERS: INHALATION_SPRAY | RESPIRATORY_TRACT | 1 refills | Status: DC

## 2021-08-12 MED ORDER — ALBUTEROL SULFATE (2.5 MG/3ML) 0.083% IN NEBU: INHALATION_SOLUTION | RESPIRATORY_TRACT | 3 refills | Status: DC

## 2021-08-12 NOTE — Telephone Encounter (Signed)
Caitlin Tanner is wanting an update on the albuterol nebulizer solution.  The pharmacy told her it wasn't approved yet and she was calling wanting an update.  She would like a call back.  Please advise.

## 2021-08-12 NOTE — Telephone Encounter (Signed)
Albuterol needs to be filed on part B medicare with dx code in notes to pharmacy, I have resent [facility] with [facility] and dx code. Patient has been made aware. I did discontinue her albuterol inhaler by mistake and resent it in also.

## 2021-08-13 ENCOUNTER — Other Ambulatory Visit: Payer: Self-pay | Admitting: *Deleted

## 2021-08-19 NOTE — Telephone Encounter (Signed)
Additional paperwork has been filled out and placed in [place] office for her to review and sign for patients albuterol nebulizer medication.

## 2021-08-21 ENCOUNTER — Ambulatory Visit (HOSPITAL_COMMUNITY)
Admission: RE | Admit: 2021-08-21 | Discharge: 2021-08-21 | Disposition: A | Discharge status: Home / Self Care | Payer: Medicare Other | Source: Ambulatory Visit | Attending: Family Medicine | Admitting: Family Medicine

## 2021-08-21 ENCOUNTER — Ambulatory Visit (INDEPENDENT_AMBULATORY_CARE_PROVIDER_SITE_OTHER): Payer: Medicare Other | Admitting: Family Medicine

## 2021-08-21 ENCOUNTER — Other Ambulatory Visit: Payer: Self-pay

## 2021-08-21 VITALS — BP 138/78 | HR 95 | Temp 98.7°F | Resp 18 | Ht 67.0 in | Wt 179.8 lb

## 2021-08-21 DIAGNOSIS — J4540 Moderate persistent asthma, uncomplicated: Secondary | ICD-10-CM | POA: Diagnosis not present

## 2021-08-21 DIAGNOSIS — R051 Acute cough: Secondary | ICD-10-CM | POA: Insufficient documentation

## 2021-08-21 DIAGNOSIS — J310 Chronic rhinitis: Secondary | ICD-10-CM | POA: Diagnosis not present

## 2021-08-21 DIAGNOSIS — K219 Gastro-esophageal reflux disease without esophagitis: Secondary | ICD-10-CM

## 2021-08-21 MED ORDER — PREDNISONE 10 MG PO TABS: ORAL_TABLET | ORAL | 0 refills | Status: DC

## 2021-08-21 NOTE — Patient Instructions (Addendum)
Asthma - Get a chest xray. I will call you when the result becomes available -Continue budesonide 0.5 by nebulizer twice a day - Continue to follow-up with pulmonary concerning your restrictive lung disease/pulmonary nodules. - Get CT scan as ordered by pulmonary - Daily controller medication(s): Advair two puffs twice daily with spacer - Prior to physical activity: albuterol 2 puffs 10-15 minutes before physical activity. - Rescue medications: albuterol 4 puffs every 4-6 hours as needed - Asthma control goals:  * Full participation in all desired activities (may need albuterol before activity) * Albuterol use two time or less a week on average (not counting use with activity) * Cough interfering with sleep two time or less a month * Oral steroids no more than once a year * No hospitalizations  Chronic rhinitis - Continue saline nasal rinse as needed for nasal symptoms. - Continue Dymista nasal spray-using 2 sprays each nostril twice a day to help with nasal congestion and runny nose. - Continue with loratadine 10mg  once daily AS NEEDED.    Reflux - Continue Protonix once a day to help with reflux. - Continue dietary and lifestyle modifications  Schedule a follow up appointment in 2 weeks or sooner if needed   Lifestyle Changes for Controlling GERD When you have GERD, stomach acid feels as if it's backing up toward your mouth. Whether or not you take medication to control your GERD, your symptoms can often be improved with lifestyle changes.   Raise Your Head Reflux is more likely to strike when you're lying down flat, because stomach fluid can flow backward more easily. Raising the head of your bed 4-6 inches can help. To do this: Slide blocks or books under the legs at the head of your bed. Or, place a wedge under the mattress. Many foam stores can make a suitable wedge for you. The wedge should run from your waist to the top of your head. Don't just prop your head on several  pillows. This increases pressure on your stomach. It can make GERD worse.  Watch Your Eating Habits Certain foods may increase the acid in your stomach or relax the lower esophageal sphincter, making GERD more likely. It's best to avoid the following: Coffee, tea, and carbonated drinks (with and without caffeine) Fatty, fried, or spicy food Mint, chocolate, onions, and tomatoes Any other foods that seem to irritate your stomach or cause you pain  Relieve the Pressure Eat smaller meals, even if you have to eat more often. Don't lie down right after you eat. Wait a few hours for your stomach to empty. Avoid tight belts and tight-fitting clothes. Lose excess weight.  Tobacco and Alcohol Avoid smoking tobacco and drinking alcohol. They can make GERD symptoms worse.

## 2021-08-21 NOTE — Progress Notes (Signed)
[place], [address] [place]  [address] Dept: [phone]  FOLLOW UP NOTE  Patient ID: Caitlin Tanner, female    DOB: [birth year]  Age: 70 y.o. MRN: [id number] Date of Office Visit: 08/21/2021  Assessment  Chief Complaint: Asthma (Says it is ok.)  HPI Caitlin Tanner is a 70 year old female who presents the clinic for follow-up visit.  She was last seen in this clinic on 08/07/2021 by Althea Charon, FNP, for evaluation of asthma with acute exacerbation, chronic rhinitis, and reflux.  She reports that she has been to urgent care 2 times and received antibiotic and prednisone each time.  At her last visit to our clinic it was recommended that she follow-up with her pulmonary doctor concerning her breathing.  At today's visit, she reports her asthma has been poorly controlled with symptoms including shortness of breath, wheezing occurring daytime and nighttime, and cough producing mucus ranging in color from clear to brown to green.  She continues Advair 2 puffs twice a day and has not used albuterol since her last visit to this clinic.  She does report that she has recently been able to get the budesonide via nebulizer and has used this for the last 5 days with moderate improvement.  She reports that her cough has improved each time she used prednisone, however, once the taper was over the cough returned.  Chronic rhinitis is reported as not well controlled with symptoms including clear rhinorrhea, nasal congestion, sneezing, and dry throat.  She continues Dymista, loratadine, and saline nasal rinses.  Reflux is reported as well controlled with no symptoms including heartburn or vomiting.  She continues pantoprazole daily.  She does see Dr. Elsworth Soho, pulmonary specialist, for management of lung nodules with her last visit on 01/08/2021.  Follow-up CT chest is ordered for 08/2021.  Her current medications are listed in the chart.  Drug Allergies:  Allergies  Allergen Reactions   Conj  Estrog-Medroxyprogest Ace     REACTION: UNKNOWN REACTION   Ketorolac Tromethamine     Felt like she had "bugs in her head"   Sulfa Antibiotics Nausea And Vomiting   Tramadol Hcl Nausea Only    Physical Exam: BP 138/78   Pulse 95   Temp 98.7 F (37.1 C) (Temporal)   Resp 18   Ht 5\' 7"  (1.702 m)   Wt 179 lb 12.8 oz (81.6 kg)   SpO2 95%   BMI 28.16 kg/m    Physical Exam Vitals reviewed.  Constitutional:      Appearance: Normal appearance.  HENT:     Head: Normocephalic and atraumatic.     Right Ear: Tympanic membrane normal.     Left Ear: Tympanic membrane normal.     Nose:     Comments: Bilateral nares slightly erythematous with clear nasal drainage noted.  Pharynx normal.  Ears normal.  Eyes normal.    Mouth/Throat:     Pharynx: Oropharynx is clear.  Eyes:     Conjunctiva/sclera: Conjunctivae normal.  Cardiovascular:     Rate and Rhythm: Normal rate and regular rhythm.     Heart sounds: Normal heart sounds. No murmur heard. Musculoskeletal:        General: Normal range of motion.     Cervical back: Normal range of motion and neck supple.  Skin:    General: Skin is warm and dry.  Neurological:     Mental Status: She is alert and oriented to person, place, and time.  Psychiatric:  Mood and Affect: Mood normal.        Behavior: Behavior normal.        Thought Content: Thought content normal.        Judgment: Judgment normal.    Diagnostics: FVC 1.64, FEV1 1.19.  Predicted FVC 2.85, predicted FEV1 2.20.  Spirometry indicates restriction.  This is consistent with previous spirometry readings  Assessment and Plan: 1. Not well controlled moderate persistent asthma   2. Acute cough   3. Gastroesophageal reflux disease, unspecified whether esophagitis present   4. Chronic rhinitis     Meds ordered this encounter  Medications   predniSONE (DELTASONE) 10 MG tablet    Sig: take 2 tablets twice a day for 4 days, then take 2 tablets once a day for 4 days, then  take 1 tablet once a day for 4 days.    Dispense:  28 tablet    Refill:  0    Patient Instructions  Asthma - Get a chest xray. I will call you when the result becomes available -Continue budesonide 0.5 by nebulizer twice a day - Continue to follow-up with pulmonary concerning your restrictive lung disease/pulmonary nodules. - Get CT scan as ordered by pulmonary - Daily controller medication(s): Advair two puffs twice daily with spacer - Prior to physical activity: albuterol 2 puffs 10-15 minutes before physical activity. - Rescue medications: albuterol 4 puffs every 4-6 hours as needed - Asthma control goals:  * Full participation in all desired activities (may need albuterol before activity) * Albuterol use two time or less a week on average (not counting use with activity) * Cough interfering with sleep two time or less a month * Oral steroids no more than once a year * No hospitalizations  Chronic rhinitis - Continue saline nasal rinse as needed for nasal symptoms. - Continue Dymista nasal spray-using 2 sprays each nostril twice a day to help with nasal congestion and runny nose. - Continue with loratadine 10mg  once daily AS NEEDED.    Reflux - Continue Protonix once a day to help with reflux. - Continue dietary and lifestyle modifications  Schedule a follow up appointment in 2 weeks or sooner if needed   Return in about 2 weeks (around 09/04/2021), or if symptoms worsen or fail to improve.    Thank you for the opportunity to care for this patient.  Please do not hesitate to contact me with questions.  Gareth Morgan, FNP [facility]

## 2021-08-21 NOTE — Progress Notes (Signed)
Can you please call this patient and let her know that her chest xray did not show a pneumonia or infiltrate. Please let her know we are going to do a slow prednisone taper in addition to the current therapy she is doing now. Please call in prednisone 10 mg tablets- take 2 tablets twice a day for 4 days, then take 2 tablets once a day for 4 days, then take 1 tablet once a day for 4 days. Total 28 tablets lease. Thank you

## 2021-08-23 ENCOUNTER — Encounter: Payer: Self-pay | Admitting: Family Medicine

## 2021-08-23 DIAGNOSIS — R051 Acute cough: Secondary | ICD-10-CM | POA: Insufficient documentation

## 2021-08-24 ENCOUNTER — Encounter (HOSPITAL_COMMUNITY): Payer: Self-pay | Admitting: *Deleted

## 2021-08-24 ENCOUNTER — Other Ambulatory Visit: Payer: Self-pay

## 2021-08-24 ENCOUNTER — Emergency Department (HOSPITAL_COMMUNITY)
Admission: EM | Admit: 2021-08-24 | Discharge: 2021-08-24 | Disposition: A | Discharge status: Home / Self Care | Payer: Medicare Other | Attending: Emergency Medicine | Admitting: Emergency Medicine

## 2021-08-24 DIAGNOSIS — Z79899 Other long term (current) drug therapy: Secondary | ICD-10-CM | POA: Insufficient documentation

## 2021-08-24 DIAGNOSIS — R739 Hyperglycemia, unspecified: Secondary | ICD-10-CM | POA: Insufficient documentation

## 2021-08-24 DIAGNOSIS — J45909 Unspecified asthma, uncomplicated: Secondary | ICD-10-CM | POA: Diagnosis not present

## 2021-08-24 DIAGNOSIS — I10 Essential (primary) hypertension: Secondary | ICD-10-CM | POA: Insufficient documentation

## 2021-08-24 DIAGNOSIS — Z87891 Personal history of nicotine dependence: Secondary | ICD-10-CM | POA: Diagnosis not present

## 2021-08-24 DIAGNOSIS — Z7951 Long term (current) use of inhaled steroids: Secondary | ICD-10-CM | POA: Insufficient documentation

## 2021-08-24 DIAGNOSIS — Z7982 Long term (current) use of aspirin: Secondary | ICD-10-CM | POA: Diagnosis not present

## 2021-08-24 DIAGNOSIS — Z7984 Long term (current) use of oral hypoglycemic drugs: Secondary | ICD-10-CM | POA: Diagnosis not present

## 2021-08-24 DIAGNOSIS — R059 Cough, unspecified: Secondary | ICD-10-CM | POA: Diagnosis not present

## 2021-08-24 LAB — CBC WITH DIFFERENTIAL/PLATELET
Abs Immature Granulocytes: 0.08 10*3/uL — ABNORMAL HIGH (ref 0.00–0.07)
Basophils Absolute: 0 10*3/uL (ref 0.0–0.1)
Basophils Relative: 0 %
Eosinophils Absolute: 0 10*3/uL (ref 0.0–0.5)
Eosinophils Relative: 0 %
HCT: 35.6 % — ABNORMAL LOW (ref 36.0–46.0)
Hemoglobin: 11.9 g/dL — ABNORMAL LOW (ref 12.0–15.0)
Immature Granulocytes: 1 %
Lymphocytes Relative: 19 %
Lymphs Abs: 2.1 10*3/uL (ref 0.7–4.0)
MCH: 31.1 pg (ref 26.0–34.0)
MCHC: 33.4 g/dL (ref 30.0–36.0)
MCV: 93 fL (ref 80.0–100.0)
Monocytes Absolute: 1 10*3/uL (ref 0.1–1.0)
Monocytes Relative: 9 %
Neutro Abs: 7.7 10*3/uL (ref 1.7–7.7)
Neutrophils Relative %: 71 %
Platelets: 268 10*3/uL (ref 150–400)
RBC: 3.83 MIL/uL — ABNORMAL LOW (ref 3.87–5.11)
RDW: 11.8 % (ref 11.5–15.5)
WBC: 10.9 10*3/uL — ABNORMAL HIGH (ref 4.0–10.5)
nRBC: 0 % (ref 0.0–0.2)

## 2021-08-24 LAB — BASIC METABOLIC PANEL
Anion gap: 10 (ref 5–15)
BUN: 31 mg/dL — ABNORMAL HIGH (ref 8–23)
CO2: 24 mmol/L (ref 22–32)
Calcium: 9.1 mg/dL (ref 8.9–10.3)
Chloride: 98 mmol/L (ref 98–111)
Creatinine, Ser: 1.11 mg/dL — ABNORMAL HIGH (ref 0.44–1.00)
GFR, Estimated: 53 mL/min — ABNORMAL LOW (ref >60)
Glucose, Bld: 224 mg/dL — ABNORMAL HIGH (ref 70–99)
Potassium: 3.9 mmol/L (ref 3.5–5.1)
Sodium: 132 mmol/L — ABNORMAL LOW (ref 135–145)

## 2021-08-24 LAB — CBG MONITORING, ED: Glucose-Capillary: 253 mg/dL — ABNORMAL HIGH (ref 70–99)

## 2021-08-24 NOTE — ED Provider Notes (Signed)
[facility] EMERGENCY DEPARTMENT Provider Note   CSN: [id number] Arrival date & time: 08/24/21  1306     History Chief Complaint  Patient presents with   Hyperglycemia    Caitlin Tanner is a 70 y.o. female.  Patient presents with chief complaint of elevated blood sugar.  She checked her blood sugar this morning was greater than 400.  She states that she was recently prescribed metformin in October, but she has not taken it yet.  She was also started on a prednisone course by her primary care doctor for bronchitis.  This has caused her blood sugars to be high for the last several days.  Patient otherwise denies any fevers or vomiting or diarrhea.  She is had a persistent cough for the past several weeks unchanged today.      Past Medical History:  Diagnosis Date   Anxiety disorder    Asthma    Borderline diabetes    Diverticulosis    GERD (gastroesophageal reflux disease)    Hemorrhoids    Hypertension    IBS (irritable bowel syndrome)    S/P colonoscopy 12/23/2010   Dr Rourk-diverticulosis, anal canal hemorhhoids, anal papilla, random bx benign   S/P endoscopy 12/23/2010   Dr Girard Cooter, tubular esophagus, sm HH, minimal retained food, bile-stained mucus, otherwise normal,, esophageal bx  benign    Patient Active Problem List   Diagnosis Date Noted   Acute cough 08/23/2021   Restrictive lung disease 01/08/2021   Dyspepsia 06/09/2019   Hematochezia 06/09/2019   Not well controlled moderate persistent asthma 03/11/2019   Acute non-recurrent maxillary sinusitis 03/11/2019   Chronic rhinitis 03/11/2019   Nausea without vomiting 07/08/2013   Need for prophylactic vaccination and inoculation against influenza 06/12/2013   Well controlled persistent asthma 06/12/2013   Multiple lung nodules 06/12/2013   Abdominal bloating 11/24/2011   Left lower quadrant pain 10/22/2011   HEMORRHOIDS, INTERNAL 05/29/2009   Gastroesophageal reflux disease 05/29/2009   HEMATOCHEZIA  05/29/2009   ANXIETY NEUROSIS 05/28/2009   IRRITABLE BOWEL SYNDROME 05/28/2009   DIARRHEA 05/28/2009   ALLERGY 05/28/2009    Past Surgical History:  Procedure Laterality Date   CHOLECYSTECTOMY     cold knife conization     COLONOSCOPY  06/08/2006   internal hemorrhoids otherwise noraml colon and rectum   COLONOSCOPY  11/2010   Dr. Gala Romney: Anal canal hemorrhoids, diverticulosis, random colon biopsies unremarkable.   COLONOSCOPY WITH PROPOFOL N/A 12/15/2019   Procedure: COLONOSCOPY WITH PROPOFOL;  Surgeon: Daneil Dolin, MD;  Location: [facility];  Service: Endoscopy;  Laterality: N/A;  9:00am   egd/tcs  11/2010   see PMH   ESOPHAGOGASTRODUODENOSCOPY  06/08/2006   single distal erosion otherwise normal   ESOPHAGOGASTRODUODENOSCOPY  11/2010   Dr. Gala Romney: Longitudinal for reading of the tubular esophagus, no evidence of eosinophilic esophagitis on biopsy, small hiatal hernia, minimal amount of retained food, bile-stained mucosa.   ESOPHAGOGASTRODUODENOSCOPY (EGD) WITH PROPOFOL N/A 12/15/2019   Procedure: ESOPHAGOGASTRODUODENOSCOPY (EGD) WITH PROPOFOL;  Surgeon: Daneil Dolin, MD;  Location: [facility];  Service: Endoscopy;  Laterality: N/A;   left great toe surgery     POLYPECTOMY  12/15/2019   Procedure: POLYPECTOMY;  Surgeon: Daneil Dolin, MD;  Location: [facility];  Service: Endoscopy;;  colon   TONSILLECTOMY     WRIST FRACTURE SURGERY  07/2013   right     OB History   No obstetric history on file.     Family History  Problem Relation Age  of Onset   Stomach cancer Father 28   Throat cancer Mother 68   Diabetes Brother    Transient ischemic attack Brother    Colon cancer Neg Hx    Allergic rhinitis Neg Hx    Angioedema Neg Hx    Asthma Neg Hx    Atopy Neg Hx    Eczema Neg Hx    Immunodeficiency Neg Hx    Urticaria Neg Hx     Social History   Tobacco Use   Smoking status: Former    Packs/day: 0.10    Years: 3.00    Pack years: 0.30    Types: Cigarettes     Quit date: 09/30/1987    Years since quitting: 33.9   Smokeless tobacco: Never   Tobacco comments:    pt states she smoked socially  Vaping Use   Vaping Use: Never used  Substance Use Topics   Alcohol use: No   Drug use: No    Home Medications Prior to Admission medications   Medication Sig Start Date End Date Taking? Authorizing Provider  albuterol (PROVENTIL) (2.5 MG/3ML) 0.083% nebulizer solution 1 unit dose via nebulizer every 4-6 hours as needed for cough, wheeze, tightness in chest, shortness of breath. 08/12/21  Yes Althea Charon, FNP  albuterol (VENTOLIN HFA) 108 (90 Base) MCG/ACT inhaler USE 2 PUFFS EVERY 4 HOURS AS NEEDED FOR COUGH OR WHEEZE. MAY USE 2 PUFFS 10-20 MINUTES PRIOR TO EXERCISE 08/12/21  Yes Althea Charon, FNP  aspirin 81 MG EC tablet Take by mouth. 09/08/17  Yes [provider]  Azelastine-Fluticasone (DYMISTA) 137-50 MCG/ACT SUSP Use one spray in each nostril one to two times a day as directed. Patient taking differently: Place 2 sprays into the nose 2 (two) times daily. Use one spray in each nostril one to two times a day as directed. 11/21/20  Yes Althea Charon, FNP  bismuth subsalicylate (PEPTO BISMOL) 262 MG/15ML suspension Take 30 mLs by mouth daily as needed for indigestion or diarrhea or loose stools.    Yes [provider]  budesonide (PULMICORT) 0.5 MG/2ML nebulizer solution Use 1 unit dose twice a day for 7 days with upper respiratory tract infections/asthma flares 08/08/21  Yes Althea Charon, FNP  calcium carbonate (TUMS - DOSED IN MG ELEMENTAL CALCIUM) 500 MG chewable tablet Chew 1 tablet by mouth as needed for indigestion or heartburn.   Yes [provider]  Cholecalciferol (VITAMIN D3) 25 MCG (1000 UT) CAPS Take 2,000 Units by mouth daily.    Yes [provider]  Cranberry-Vitamin C-Probiotic (AZO CRANBERRY PO) Take 1 tablet by mouth daily as needed (UTI).    Yes [provider]  escitalopram  (LEXAPRO) 10 MG tablet Take 10 mg by mouth daily.    Yes [provider]  ezetimibe (ZETIA) 10 MG tablet Take 10 mg by mouth daily.   Yes [provider]  fluticasone-salmeterol (ADVAIR HFA) 230-21 MCG/ACT inhaler TAKE 2 PUFFS BY MOUTH TWICE A DAY 11/21/20  Yes Althea Charon, FNP  loratadine (CLARITIN) 10 MG tablet TAKE 1 TABLET BY MOUTH EVERY DAY AS NEEDED FOR ALLERGY 09/11/20  Yes Althea Charon, FNP  LORazepam (ATIVAN) 0.5 MG tablet Take 0.5 mg by mouth daily as needed for anxiety.    Yes [provider]  losartan-hydrochlorothiazide (HYZAAR) 50-12.5 MG per tablet Take 1 tablet by mouth daily.   Yes [provider]  metoprolol (TOPROL-XL) 50 MG 24 hr tablet Take 50 mg by mouth daily.   Yes [provider]  Naproxen Sodium (ALEVE PO) Take 1 tablet by mouth every 12 (twelve) hours as needed. 09/08/17  Yes [provider]  nystatin (MYCOSTATIN) 100000 UNIT/ML suspension Take 5 mLs by mouth 3 (three) times daily. 04/27/21  Yes [provider]  pantoprazole (PROTONIX) 40 MG tablet TAKE 1 TABLET BY MOUTH EVERY DAY 02/28/21  Yes Mahala Menghini, PA-C  predniSONE (DELTASONE) 10 MG tablet take 2 tablets twice a day for 4 days, then take 2 tablets once a day for 4 days, then take 1 tablet once a day for 4 days. 08/21/21  Yes Ambs, Kathrine Cords, FNP  tretinoin (RETIN-A) 0.1 % cream Apply 1 application topically at bedtime. 07/18/21  Yes [provider]  GARLIC PO  0 Refill(s) Patient not taking: Reported on 08/24/2021 03/31/20   [provider]  metFORMIN (GLUCOPHAGE) 500 MG tablet Take 500 mg by mouth 2 (two) times daily. Patient not taking: Reported on 08/21/2021 07/15/21   [provider]  multivitamin [facility]) per tablet Take 1 tablet by mouth daily. Patient not taking: Reported on 08/24/2021    [provider]  Omega-3 Fatty Acids (FISH OIL) 1000 MG CAPS Take 2,000 mg by mouth daily. Patient not taking:  Reported on 08/24/2021    [provider]  promethazine-dextromethorphan (PROMETHAZINE-DM) 6.25-15 MG/5ML syrup Take 5 mLs by mouth every 6 (six) hours as needed. Patient not taking: Reported on 08/21/2021 07/29/21   [provider]  Zinc Acetate 50 MG CAPS Take by mouth. Patient not taking: Reported on 08/24/2021 12/04/20   [provider]  zinc gluconate 50 MG tablet Take 50 mg by mouth daily. Patient not taking: Reported on 08/24/2021    [provider]    Allergies    Conj estrog-medroxyprogest ace, Ketorolac tromethamine, Sulfa antibiotics, and Tramadol hcl  Review of Systems   Review of Systems  Constitutional:  Negative for fever.  HENT:  Negative for ear pain.   Eyes:  Negative for pain.  Respiratory:  Negative for cough.   Cardiovascular:  Negative for chest pain.  Gastrointestinal:  Negative for abdominal pain.  Genitourinary:  Negative for flank pain.  Musculoskeletal:  Negative for back pain.  Skin:  Negative for rash.  Neurological:  Negative for headaches.   Physical Exam Updated Vital Signs BP (!) 150/87 (BP Location: Right Arm)   Pulse 66   Temp 98 F (36.7 C) (Oral)   Resp 18   Ht 5\' 7"  (1.702 m)   Wt 81.2 kg   SpO2 96%   BMI 28.04 kg/m   Physical Exam Constitutional:      General: She is not in acute distress.    Appearance: Normal appearance.  HENT:     Head: Normocephalic.     Nose: Nose normal.  Eyes:     Extraocular Movements: Extraocular movements intact.  Cardiovascular:     Rate and Rhythm: Normal rate.  Pulmonary:     Effort: Pulmonary effort is normal.  Musculoskeletal:        General: Normal range of motion.     Cervical back: Normal range of motion.  Neurological:     General: No focal deficit present.     Mental Status: She is alert. Mental status is at baseline.    ED Results / Procedures / Treatments   Labs (all labs ordered are listed, but only abnormal results are displayed) Labs Reviewed   CBC WITH DIFFERENTIAL/PLATELET - Abnormal; Notable for the following components:      Result Value  WBC 10.9 (*)    RBC 3.83 (*)    Hemoglobin 11.9 (*)    HCT 35.6 (*)    Abs Immature Granulocytes 0.08 (*)    All other components within normal limits  BASIC METABOLIC PANEL - Abnormal; Notable for the following components:   Sodium 132 (*)    Glucose, Bld 224 (*)    BUN 31 (*)    Creatinine, Ser 1.11 (*)    GFR, Estimated 53 (*)    All other components within normal limits  CBG MONITORING, ED - Abnormal; Notable for the following components:   Glucose-Capillary 253 (*)    All other components within normal limits    EKG None  Radiology No results found.  Procedures Procedures   Medications Ordered in ED Medications - No data to display  ED Course  I have reviewed the triage vital signs and the nursing notes.  Pertinent labs & imaging results that were available during my care of the patient were reviewed by me and considered in my medical decision making (see chart for details).    MDM Rules/Calculators/A&P                           Labs are sent white count and hemoglobin 12.  Chemistry shows blood sugar mildly elevated at 224.  I advised the patient to take her metformin as prescribed by her doctor.  I advised her to follow-up with her primary care doctor within the week.  I advised her to return back to the ER if her blood sugars persistently remain high, she has fevers or pain difficulty breathing or any additional concerns.  Final Clinical Impression(s) / ED Diagnoses Final diagnoses:  Hyperglycemia    Rx / DC Orders ED Discharge Orders     None        Luna Fuse, MD 08/24/21 1640

## 2021-08-24 NOTE — Discharge Instructions (Addendum)
Take your metformin as written by your doctor.  Return to the ER if you have fevers or difficulty breathing or worsening symptoms.  Otherwise follow-up with your doctor within the week for repeat evaluation.

## 2021-08-24 NOTE — ED Triage Notes (Signed)
Pt states blood sugars were 485 this am. Pt is on steroids for past couple months due to chronic bronchitis.

## 2021-08-26 ENCOUNTER — Telehealth: Payer: Self-pay | Admitting: *Deleted

## 2021-08-26 NOTE — Telephone Encounter (Signed)
Patient called back and stated that she is doing a little better. She states that when she exerts a lot is when she gets winded. She stated that Saturday her blood sugar was 400 and she went to the ER at [facility] and she is now starting Metformin. The ER doctor thinks that it is from the dose of Prednisone that increased her blood sugar. She did state that she still has a "terrible cough" and is coughing up green phlegm mainly but sometimes it is tinged brown. She was confused about the Budesonide nebulizer medication. She stated that she used it for 7 days, I did advise that per the prescription she can still use the Budesonide if she is having a flair or upper respiratory symptoms. She is still using her ALbuterol PRN as well and used the nebulizer twice yesterday.

## 2021-08-26 NOTE — Telephone Encounter (Signed)
Called and left a voicemail asking for patient to return call to discuss.  °

## 2021-08-26 NOTE — Telephone Encounter (Signed)
-----   Message from Dara Hoyer, FNP sent at 08/26/2021  8:47 AM EST ----- Can you please call this patient and check on her breathing and cough? Thank you

## 2021-08-26 NOTE — Telephone Encounter (Signed)
Patient reports that she is able to do more activity now than earlier in the week last week.  She does continue to cough.  She is willing to begin Breztri 2 puffs twice a day with a spacer that will replace Advair for the next 1 month and then return to Advair at her usual dose.  Samples are available at the checkout counter in the [place] office.  She agrees to follow-up with Dr. Elsworth Soho regarding continuing cough.  She will call the clinic with any further questions.

## 2021-08-29 NOTE — Telephone Encounter (Signed)
PA was denied stating that it needs to be filed under part B. The medication should be taken care of and there should be no further issues.

## 2021-08-30 ENCOUNTER — Ambulatory Visit (HOSPITAL_COMMUNITY): Payer: Medicare Other

## 2021-09-09 ENCOUNTER — Ambulatory Visit: Payer: Medicare Other | Admitting: Family Medicine

## 2021-09-20 ENCOUNTER — Other Ambulatory Visit: Payer: Self-pay

## 2021-09-20 ENCOUNTER — Ambulatory Visit (HOSPITAL_COMMUNITY)
Admission: RE | Admit: 2021-09-20 | Discharge: 2021-09-20 | Disposition: A | Discharge status: Home / Self Care | Payer: Medicare Other | Source: Ambulatory Visit | Attending: Pulmonary Disease | Admitting: Pulmonary Disease

## 2021-09-20 DIAGNOSIS — R911 Solitary pulmonary nodule: Secondary | ICD-10-CM | POA: Diagnosis not present

## 2021-10-14 ENCOUNTER — Encounter: Payer: Self-pay | Admitting: Pulmonary Disease

## 2021-10-14 ENCOUNTER — Ambulatory Visit (INDEPENDENT_AMBULATORY_CARE_PROVIDER_SITE_OTHER): Payer: Medicare Other | Admitting: Pulmonary Disease

## 2021-10-14 ENCOUNTER — Other Ambulatory Visit: Payer: Self-pay

## 2021-10-14 DIAGNOSIS — J984 Other disorders of lung: Secondary | ICD-10-CM

## 2021-10-14 DIAGNOSIS — J45998 Other asthma: Secondary | ICD-10-CM

## 2021-10-14 NOTE — Patient Instructions (Signed)
°  Continue on advair

## 2021-10-14 NOTE — Progress Notes (Signed)
° °  Subjective:    Patient ID: Caitlin Tanner, female    DOB: 1951-04-21, 71 y.o.   MRN: [id number]  HPI  71 yo remote smoker with asthma for FU of "restrictive lung disease" She was evaluated in 2015 at the pulmonary clinic for multiple nodules which were noted to be benign  She reports asthma onset in her 76s with repeated attacks of asthmatic bronchitis especially in the spring and fall.  She was under the care of Dr. Neldon Mc initially and now with Dr. Ernst Bowler.  She has been on steroid inhalers for more than 10 years, initially [place] and Advair and now [employer].  PFTs : There is 1 spirometry in the past which does show a low FEV1 ratio, mild variability in FEV1.  17-month follow-up visit.  She had an uneventful year other than a flareup during Thanksgiving for which she required antibiotics and then a course of prednisone which seemed to knock it out.  She was diagnosed with "chronic bronchitis".  She has been on Breztri for the last 3 months this is very expensive so she has been requesting samples.  We reviewed CT scan, her HRCT did not show any evidence of ILD but showed a new nodule. Repeat CT chest in December showed resolution of new nodule but scattered areas of tree-in-bud nodular density   Significant tests/ events reviewed  Spirometry: 11/2020 FEV1: 1.24/50%, FVC: 1.66/50%, FEV1/FVC: 75 02/2020 spirometry ratio 66, FEV1 1.14/44% 07/2012 FEV1 was 1.4 L/54% predicted.  alpha-1 antitrypsin and MM type and normal levels.   IgE 206   08/2021 CT chest wo con >> scattered areas of peribronchovascular tree-in-bud nodular clusters  HRCT 12/2020 no ILD, 6 mm LUL nodule  05/2015 CT chest Stable bilateral pulmonary nodularity appear benign.  Stable mild bronchiectasis and linear scarring at the lung bases. Compared to 02/2014  Review of Systems neg for any significant sore throat, dysphagia, itching, sneezing, nasal congestion or excess/ purulent secretions, fever, chills, sweats, unintended  wt loss, pleuritic or exertional cp, hempoptysis, orthopnea pnd or change in chronic leg swelling. Also denies presyncope, palpitations, heartburn, abdominal pain, nausea, vomiting, diarrhea or change in bowel or urinary habits, dysuria,hematuria, rash, arthralgias, visual complaints, headache, numbness weakness or ataxia.     Objective:   Physical Exam  Gen. Pleasant, well-nourished, in no distress ENT - no thrush, no pallor/icterus,no post nasal drip Neck: No JVD, no thyromegaly, no carotid bruits Lungs: no use of accessory muscles, no dullness to percussion, clear without rales or rhonchi  Cardiovascular: Rhythm regular, heart sounds  normal, no murmurs or gallops, no peripheral edema Musculoskeletal: No deformities, no cyanosis or clubbing        Assessment & Plan:

## 2021-10-14 NOTE — Assessment & Plan Note (Signed)
No evidence of ILD on HRCT. CT chest shows scattered clusters of tree-in-bud nodular densities.  The CT scan was taken during an episode of acute bronchitis and feel that this is likely infectious/inflammatory, doubt MAC  We will follow-up in 1 year

## 2021-10-14 NOTE — Assessment & Plan Note (Signed)
Due to frequent exacerbations, therapy has been stepped up from Advair to [place] This seems to be working better and okay to continue this especially if she can afford

## 2021-11-29 ENCOUNTER — Ambulatory Visit: Payer: Medicare Other | Admitting: Allergy & Immunology

## 2021-12-19 ENCOUNTER — Other Ambulatory Visit: Payer: Self-pay | Admitting: Gastroenterology

## 2021-12-19 NOTE — Telephone Encounter (Signed)
Last ov 06/09/19 ?

## 2021-12-20 ENCOUNTER — Encounter: Payer: Self-pay | Admitting: Internal Medicine

## 2021-12-20 NOTE — Telephone Encounter (Signed)
Needs office visit. Unable to provide further refills ?

## 2022-02-26 ENCOUNTER — Other Ambulatory Visit: Payer: Self-pay | Admitting: Family

## 2022-02-26 ENCOUNTER — Other Ambulatory Visit: Payer: Self-pay | Admitting: Gastroenterology

## 2022-02-26 NOTE — Telephone Encounter (Signed)
Needs office visit prior to further refills.

## 2022-02-27 ENCOUNTER — Encounter: Payer: Self-pay | Admitting: Internal Medicine

## 2022-03-22 ENCOUNTER — Other Ambulatory Visit: Payer: Self-pay | Admitting: Family Medicine

## 2022-06-17 ENCOUNTER — Ambulatory Visit (INDEPENDENT_AMBULATORY_CARE_PROVIDER_SITE_OTHER): Payer: Medicare Other | Admitting: Internal Medicine

## 2022-06-17 ENCOUNTER — Encounter: Payer: Self-pay | Admitting: Internal Medicine

## 2022-06-17 VITALS — BP 159/74 | HR 69 | Temp 97.5°F | Ht 67.0 in | Wt 178.6 lb

## 2022-06-17 DIAGNOSIS — K219 Gastro-esophageal reflux disease without esophagitis: Secondary | ICD-10-CM | POA: Diagnosis not present

## 2022-06-17 DIAGNOSIS — R194 Change in bowel habit: Secondary | ICD-10-CM

## 2022-06-17 NOTE — Progress Notes (Unsigned)
Primary Care Physician:  Thea Alken Primary Gastroenterologist:  Dr. Gala Romney  Pre-Procedure History & Physical: HPI:  Caitlin Tanner is a 71 y.o. female here for further evaluation change in bowel habits.  Recently put on metformin.  She has gone from 1-2 formed bowel movements daily to 3-4 semiformed bowel movements daily.  No incontinence.  No rectal bleeding.  History of serrated polyp and adenoma removed 2021; due for surveillance colonoscopy March 2024. History of GERD well-controlled on Protonix pantoprazole 40 mg daily.  Prior EGD negative for eosinophilic esophagitis/Barrett's epithelium.  Denies dysphagia.  Past Medical History:  Diagnosis Date   Anxiety disorder    Asthma    Borderline diabetes    Diverticulosis    GERD (gastroesophageal reflux disease)    Hemorrhoids    Hypertension    IBS (irritable bowel syndrome)    S/P colonoscopy 12/23/2010   Dr Sreekar Broyhill-diverticulosis, anal canal hemorhhoids, anal papilla, random bx benign   S/P endoscopy 12/23/2010   Dr Girard Cooter, tubular esophagus, sm HH, minimal retained food, bile-stained mucus, otherwise normal,, esophageal bx  benign    Past Surgical History:  Procedure Laterality Date   CHOLECYSTECTOMY     cold knife conization     COLONOSCOPY  06/08/2006   internal hemorrhoids otherwise noraml colon and rectum   COLONOSCOPY  11/2010   Dr. Gala Romney: Anal canal hemorrhoids, diverticulosis, random colon biopsies unremarkable.   COLONOSCOPY WITH PROPOFOL N/A 12/15/2019   Procedure: COLONOSCOPY WITH PROPOFOL;  Surgeon: Daneil Dolin, MD;  [facility]: [facility];  Service: Endoscopy;  Laterality: N/A;  9:00am   egd/tcs  11/2010   see PMH   ESOPHAGOGASTRODUODENOSCOPY  06/08/2006   single distal erosion otherwise normal   ESOPHAGOGASTRODUODENOSCOPY  11/2010   Dr. Gala Romney: Longitudinal for reading of the tubular esophagus, no evidence of eosinophilic esophagitis on biopsy, small hiatal hernia, minimal amount of retained food,  bile-stained mucosa.   ESOPHAGOGASTRODUODENOSCOPY (EGD) WITH PROPOFOL N/A 12/15/2019   Procedure: ESOPHAGOGASTRODUODENOSCOPY (EGD) WITH PROPOFOL;  Surgeon: Daneil Dolin, MD;  Location: [facility];  Service: Endoscopy;  Laterality: N/A;   left great toe surgery     POLYPECTOMY  12/15/2019   Procedure: POLYPECTOMY;  Surgeon: Daneil Dolin, MD;  Location: [facility];  Service: Endoscopy;;  colon   TONSILLECTOMY     WRIST FRACTURE SURGERY  07/2013   right    Prior to Admission medications   Medication Sig Start Date End Date Taking? Authorizing Provider  albuterol (VENTOLIN HFA) 108 (90 Base) MCG/ACT inhaler USE 2 PUFFS EVERY 4 HOURS AS NEEDED FOR COUGH OR WHEEZE. MAY USE 2 PUFFS 10-20 MINUTES PRIOR TO EXERCISE 08/12/21  Yes Althea Charon, FNP  aspirin 81 MG EC tablet Take by mouth. 09/08/17  Yes [provider]  Azelastine-Fluticasone (DYMISTA) 137-50 MCG/ACT SUSP Use one spray in each nostril one to two times a day as directed. Patient taking differently: Place 2 sprays into the nose 2 (two) times daily. Use one spray in each nostril one to two times a day as directed. 11/21/20  Yes Althea Charon, FNP  bismuth subsalicylate (PEPTO BISMOL) 262 MG/15ML suspension Take 30 mLs by mouth daily as needed for indigestion or diarrhea or loose stools.    Yes [provider]  calcium carbonate (TUMS - DOSED IN MG ELEMENTAL CALCIUM) 500 MG chewable tablet Chew 1 tablet by mouth as needed for indigestion or heartburn.   Yes [provider]  Cholecalciferol (VITAMIN D3) 25 MCG (1000 UT)  CAPS Take 2,000 Units by mouth daily.    Yes [provider]  Cranberry-Vitamin C-Probiotic (AZO CRANBERRY PO) Take 1 tablet by mouth daily as needed (UTI).    Yes [provider]  escitalopram (LEXAPRO) 10 MG tablet Take 10 mg by mouth daily.    Yes [provider]  ezetimibe (ZETIA) 10 MG tablet Take 10 mg by mouth daily.   Yes [provider]   fluticasone-salmeterol (ADVAIR HFA) 230-21 MCG/ACT inhaler TAKE 2 PUFFS BY MOUTH TWICE A DAY 02/27/22  Yes Ambs, Kathrine Cords, FNP  GARLIC PO  04/30/43  Yes [provider]  loratadine (CLARITIN) 10 MG tablet TAKE 1 TABLET BY MOUTH EVERY DAY AS NEEDED FOR ALLERGY 09/11/20  Yes Althea Charon, FNP  LORazepam (ATIVAN) 0.5 MG tablet Take 0.5 mg by mouth daily as needed for anxiety.    Yes [provider]  losartan-hydrochlorothiazide (HYZAAR) 50-12.5 MG per tablet Take 1 tablet by mouth daily.   Yes [provider]  meloxicam (MOBIC) 7.5 MG tablet Take 7.5 mg by mouth 2 (two) times daily. 06/04/22  Yes [provider]  metFORMIN (GLUCOPHAGE) 500 MG tablet Take 500 mg by mouth 2 (two) times daily. 07/15/21  Yes [provider]  metoprolol (TOPROL-XL) 50 MG 24 hr tablet Take 50 mg by mouth daily.   Yes [provider]  multivitamin [facility]) per tablet Take 1 tablet by mouth daily.   Yes [provider]  Omega-3 Fatty Acids (FISH OIL) 1000 MG CAPS Take 2,000 mg by mouth daily.   Yes [provider]  pantoprazole (PROTONIX) 40 MG tablet TAKE 1 TABLET BY MOUTH EVERY DAY 02/28/21  Yes Mahala Menghini, PA-C  tretinoin (RETIN-A) 0.1 % cream Apply 1 application topically at bedtime. 07/18/21  Yes [provider]  Zinc Acetate 50 MG CAPS Take by mouth. 12/04/20  Yes [provider]    Allergies as of 06/17/2022 - Review Complete 06/17/2022  Allergen Reaction Noted   Conj estrog-medroxyprogest ace     Ketorolac tromethamine     Sulfa antibiotics Nausea And Vomiting 09/22/2019   Tramadol hcl Nausea Only     Family History  Problem Relation Age of Onset   Stomach cancer Father 48   Throat cancer Mother 54   Diabetes Brother    Transient ischemic attack Brother    Colon cancer Neg Hx    Allergic rhinitis Neg Hx    Angioedema Neg Hx    Asthma Neg Hx    Atopy Neg Hx    Eczema Neg Hx    Immunodeficiency Neg Hx     Urticaria Neg Hx     Social History   Socioeconomic History   Marital status: Married    Spouse name: Not on file   Number of children: 1   Years of education: Not on file   Highest education level: Not on file  Occupational History    Employer: UNEMPLOYED  Tobacco Use   Smoking status: Former    Packs/day: 0.10    Years: 3.00    Total pack years: 0.30    Types: Cigarettes    Quit date: 09/30/1987    Years since quitting: 34.7   Smokeless tobacco: Never   Tobacco comments:    pt states she smoked socially  Vaping Use   Vaping Use: Never used  Substance and Sexual Activity   Alcohol use: No   Drug use: No   Sexual activity: Not Currently  Other Topics Concern  Not on file  Social History Narrative   Not on file   Social Determinants of Health   Financial Resource Strain: Not on file  Food Insecurity: Not on file  Transportation Needs: Not on file  Physical Activity: Not on file  Stress: Not on file  Social Connections: Not on file  Intimate Partner Violence: Not on file    Review of Systems: See HPI, otherwise negative ROS  Physical Exam: BP (!) 159/74 (BP Location: Right Arm, Patient Position: Sitting, Cuff Size: Normal)   Pulse 69   Temp (!) 97.5 F (36.4 C) (Temporal)   Ht '5\' 7"'$  (1.702 m)   Wt 178 lb 9.6 oz (81 kg)   SpO2 97%   BMI 27.97 kg/m  General:   Alert,  Well-developed, well-nourished, pleasant and cooperative in NAD Neck:  Supple; no masses or thyromegaly. No significant cervical adenopathy. Lungs:  Clear throughout to auscultation.   No wheezes, crackles, or rhonchi. No acute distress. Heart:  Regular rate and rhythm; no murmurs, clicks, rubs,  or gallops. Abdomen: Non-distended, normal bowel sounds.  Soft and nontender without appreciable mass or hepatosplenomegaly.  Pulses:  Normal pulses noted. Extremities:  Without clubbing or edema.  Impression/Plan: Pleasant 71 year old lady with a history of colonic adenoma; due for surveillance  colonoscopy March 2024. Change in bowel habits recently strongly correlated with addition of metformin to her regimen.  This is likely the culprit.  With other entities such as microscopic colitis being less likely.  No alarm symptoms. At this point, the benefits of metformin appear to outweigh the downside.  GERD well-controlled on Protonix 40 mg daily  Recommendations:  I recommend you simply try some over-the-counter Imodium (1) 2 mg tablet to improve bowel function.  We will plan to schedule a surveillance colonoscopy for history of colon polyps (ASA 2) in March of next year  Continue Protonix or pantoprazole 40 mg each day 30 minutes before breakfast for acid reflux.  If you have any interim problems or questions, please do not hesitate to call.   Notice: This dictation was prepared with Dragon dictation along with smaller phrase technology. Any transcriptional errors that result from this process are unintentional and may not be corrected upon review.

## 2022-06-17 NOTE — Patient Instructions (Signed)
It was good to see you again today!  As discussed, the metformin is the likely culprit and loosening your stools.  However, the benefits of continuing metformin outweigh side effects at this time.  I recommend you simply try some over-the-counter Imodium (1) 2 mg tablet to improve bowel function.  We will plan to schedule a surveillance colonoscopy for history of colon polyps (ASA 2) in March of next year  Continue Protonix or pantoprazole 40 mg each day 30 minutes before breakfast for acid reflux.  If you have any interim problems or questions, please do not hesitate to call.

## 2022-09-25 NOTE — Progress Notes (Signed)
[place], [address] [place] [address] Dept: [phone]  FOLLOW UP NOTE  Patient ID: Caitlin Tanner, female    DOB: August 06, 1951  Age: 71 y.o. MRN: [id number] Date of Office Visit: 09/26/2022  Assessment  Chief Complaint: Follow-up (Follow up)  HPI Caitlin Tanner is a 71 year old female who presents to the clinic for follow-up visit.  She was last seen in this clinic on 09/20/2021 by Gareth Morgan, FNP, for evaluation of asthma, chronic rhinitis, and reflux.  At today's visit, she reports her asthma has been moderately well controlled with occasional dry cough . She denies shortness of breath or wheeze with rest or activity. She continues Advair 115-2 puffs twice a day and uses her albuterol inhaler 2-3 times a week with relief of symptoms. She reports that dusting can aggravate her asthma. She continues to follow Dr. Elsworth Soho, pulmonary specialist, for evaluation of pulmonary nodules via chest CT. Allergic rhinitis is reported as well controlled with no symptoms currently. She continues Dymista and loratadine as needed with relief of symptoms. Reflux is reported as well controlled with no symptoms including heartburn or vomiting. She continues pantoprazole 40 mg once a day.  She continues to follow Dr. Gala Romney, GI specialist, for history of colonic adenoma with suggested follow-up EGD March 2024. She reports intermittent soreness occurring near her left ear and left TMJ area when chewing food that developed about 2 weeks ago. She denies changes in hearing, ear pain, or recent dental work. Her current medications are listed in the chart.    Drug Allergies:  Allergies  Allergen Reactions   Conj Estrog-Medroxyprogest Ace     REACTION: UNKNOWN REACTION   Ketorolac Tromethamine     Felt like she had "bugs in her head"   Sulfa Antibiotics Nausea And Vomiting   Tramadol Hcl Nausea Only    Physical Exam: BP 122/62   Pulse 77   Temp 98 F (36.7 C)   Resp 20   Ht '5\' 7"'$  (1.702 m)   Wt 174  lb (78.9 kg)   SpO2 97%   BMI 27.25 kg/m    Physical Exam Vitals reviewed.  Constitutional:      Appearance: Normal appearance.  HENT:     Head: Normocephalic and atraumatic.     Right Ear: Tympanic membrane normal.     Left Ear: Tympanic membrane normal.     Nose:     Comments: Bilateral nares slightly erythematous with clear nasal drainage noted.  Pharynx normal.  Ears normal.  Eyes normal.    Mouth/Throat:     Pharynx: Oropharynx is clear.  Eyes:     Conjunctiva/sclera: Conjunctivae normal.  Cardiovascular:     Rate and Rhythm: Normal rate and regular rhythm.     Heart sounds: Normal heart sounds. No murmur heard. Pulmonary:     Effort: Pulmonary effort is normal.     Breath sounds: Normal breath sounds.     Comments: Lungs clear to ausculation Musculoskeletal:        General: Normal range of motion.     Cervical back: Normal range of motion and neck supple.  Skin:    General: Skin is warm and dry.  Neurological:     Mental Status: She is alert and oriented to person, place, and time.  Psychiatric:        Mood and Affect: Mood normal.        Behavior: Behavior normal.        Thought Content: Thought content normal.  Judgment: Judgment normal.     Diagnostics: FVC 1.86, FEV1 1.43. Predicted FVC 3.02, predicted FEV1 2.31. Spirometry indicates restriction. This is consistent with previous spirometry readings.   Assessment and Plan: 1. Moderate persistent asthma, uncomplicated   2. Chronic rhinitis   3. Gastroesophageal reflux disease, unspecified whether esophagitis present     Meds ordered this encounter  Medications   albuterol (VENTOLIN HFA) 108 (90 Base) MCG/ACT inhaler    Sig: USE 2 PUFFS EVERY 4 HOURS AS NEEDED FOR COUGH OR WHEEZE. MAY USE 2 PUFFS 10-20 MINUTES PRIOR TO EXERCISE    Dispense:  18 g    Refill:  1   Azelastine-Fluticasone (DYMISTA) 137-50 MCG/ACT SUSP    Sig: Place 2 sprays into the nose 2 (two) times daily. Use one spray in each  nostril one to two times a day as directed.    Dispense:  23 g    Refill:  5   loratadine (CLARITIN) 10 MG tablet    Sig: Take 1 tablet (10 mg total) by mouth daily as needed for allergies.    Dispense:  90 tablet    Refill:  1   fluticasone-salmeterol (ADVAIR HFA) 230-21 MCG/ACT inhaler    Sig: Inhale 2 puffs into the lungs 2 (two) times daily.    Dispense:  12 g    Refill:  5    COURTESY REFILL. NEEDS OV FOR ADDITIONAL REFILLS.   pantoprazole (PROTONIX) 40 MG tablet    Sig: Take 1 tablet (40 mg total) by mouth daily.    Dispense:  90 tablet    Refill:  1    Patient Instructions  Asthma - Daily controller medication(s): Advair 115-two puffs twice daily with spacer - Prior to physical activity: albuterol 2 puffs 10-15 minutes before physical activity. - Rescue medications: albuterol 4 puffs every 4-6 hours as needed - Asthma control goals:  * Full participation in all desired activities (may need albuterol before activity) * Albuterol use two time or less a week on average (not counting use with activity) * Cough interfering with sleep two time or less a month * Oral steroids no more than once a year * No hospitalizations - Follow up with Dr. Elsworth Soho, pulmonary specialist, as recommended (around January 2024)  Chronic rhinitis - Continue saline nasal rinse as needed for nasal symptoms. - Continue Dymista nasal spray-using 2 sprays each nostril twice a day to help with nasal congestion and runny nose. - Continue with loratadine '10mg'$  once daily as needed    Reflux - Continue Protonix once a day to help with reflux as previously prescribed - Continue dietary and lifestyle modifications  TMJ Talk with your dentist about a mouth guard Give the printed exercises a try  Call the clinic if this treatment plan is not working well for you  Follow up in 6 months or sooner if needed.   Return in about 6 months (around 03/28/2023), or if symptoms worsen or fail to improve.    Thank  you for the opportunity to care for this patient.  Please do not hesitate to contact me with questions.  Gareth Morgan, FNP [facility]

## 2022-09-25 NOTE — Patient Instructions (Addendum)
Asthma - Daily controller medication(s): Advair 115-two puffs twice daily with spacer - Prior to physical activity: albuterol 2 puffs 10-15 minutes before physical activity. - Rescue medications: albuterol 4 puffs every 4-6 hours as needed - Asthma control goals:  * Full participation in all desired activities (may need albuterol before activity) * Albuterol use two time or less a week on average (not counting use with activity) * Cough interfering with sleep two time or less a month * Oral steroids no more than once a year * No hospitalizations - Follow up with Dr. Elsworth Soho, pulmonary specialist, as recommended (around January 2024)  Chronic rhinitis - Continue saline nasal rinse as needed for nasal symptoms. - Continue Dymista nasal spray-using 2 sprays each nostril twice a day to help with nasal congestion and runny nose. - Continue with loratadine '10mg'$  once daily as needed    Reflux - Continue Protonix once a day to help with reflux as previously prescribed - Continue dietary and lifestyle modifications  TMJ Talk with your dentist about a mouth guard Give the printed exercises a try  Call the clinic if this treatment plan is not working well for you  Follow up in 6 months or sooner if needed.    Lifestyle Changes for Controlling GERD When you have GERD, stomach acid feels as if it's backing up toward your mouth. Whether or not you take medication to control your GERD, your symptoms can often be improved with lifestyle changes.   Raise Your Head Reflux is more likely to strike when you're lying down flat, because stomach fluid can flow backward more easily. Raising the head of your bed 4-6 inches can help. To do this: Slide blocks or books under the legs at the head of your bed. Or, place a wedge under the mattress. Many foam stores can make a suitable wedge for you. The wedge should run from your waist to the top of your head. Don't just prop your head on several pillows.  This increases pressure on your stomach. It can make GERD worse.  Watch Your Eating Habits Certain foods may increase the acid in your stomach or relax the lower esophageal sphincter, making GERD more likely. It's best to avoid the following: Coffee, tea, and carbonated drinks (with and without caffeine) Fatty, fried, or spicy food Mint, chocolate, onions, and tomatoes Any other foods that seem to irritate your stomach or cause you pain  Relieve the Pressure Eat smaller meals, even if you have to eat more often. Don't lie down right after you eat. Wait a few hours for your stomach to empty. Avoid tight belts and tight-fitting clothes. Lose excess weight.  Tobacco and Alcohol Avoid smoking tobacco and drinking alcohol. They can make GERD symptoms worse.

## 2022-09-26 ENCOUNTER — Ambulatory Visit (INDEPENDENT_AMBULATORY_CARE_PROVIDER_SITE_OTHER): Payer: Medicare Other | Admitting: Family Medicine

## 2022-09-26 ENCOUNTER — Other Ambulatory Visit: Payer: Self-pay

## 2022-09-26 ENCOUNTER — Encounter: Payer: Self-pay | Admitting: Family Medicine

## 2022-09-26 VITALS — BP 122/62 | HR 77 | Temp 98.0°F | Resp 20 | Ht 67.0 in | Wt 174.0 lb

## 2022-09-26 DIAGNOSIS — J310 Chronic rhinitis: Secondary | ICD-10-CM

## 2022-09-26 DIAGNOSIS — K219 Gastro-esophageal reflux disease without esophagitis: Secondary | ICD-10-CM | POA: Diagnosis not present

## 2022-09-26 DIAGNOSIS — J4540 Moderate persistent asthma, uncomplicated: Secondary | ICD-10-CM | POA: Diagnosis not present

## 2022-09-26 MED ORDER — LORATADINE 10 MG PO TABS: 10.0000 mg | ORAL_TABLET | Freq: Every day | ORAL | 1 refills | Status: DC | PRN

## 2022-09-26 MED ORDER — PANTOPRAZOLE SODIUM 40 MG PO TBEC
40.0000 mg | DELAYED_RELEASE_TABLET | Freq: Every day | ORAL | 1 refills | Status: DC
Start: 2022-09-26 — End: 2023-07-29

## 2022-09-26 MED ORDER — AZELASTINE-FLUTICASONE 137-50 MCG/ACT NA SUSP: 2.0000 | Freq: Two times a day (BID) | NASAL | 5 refills | Status: DC

## 2022-09-26 MED ORDER — ALBUTEROL SULFATE HFA 108 (90 BASE) MCG/ACT IN AERS
INHALATION_SPRAY | RESPIRATORY_TRACT | 1 refills | Status: DC
Start: 2022-09-26 — End: 2022-11-25

## 2022-09-26 MED ORDER — FLUTICASONE-SALMETEROL 230-21 MCG/ACT IN AERO
2.0000 | INHALATION_SPRAY | Freq: Two times a day (BID) | RESPIRATORY_TRACT | 5 refills | Status: DC
Start: 2022-09-26 — End: 2022-12-22

## 2022-11-05 ENCOUNTER — Encounter: Payer: Self-pay | Admitting: Pulmonary Disease

## 2022-11-05 ENCOUNTER — Ambulatory Visit (INDEPENDENT_AMBULATORY_CARE_PROVIDER_SITE_OTHER): Payer: Medicare Other | Admitting: Pulmonary Disease

## 2022-11-05 VITALS — BP 144/88 | HR 78 | Ht 67.0 in | Wt 180.6 lb

## 2022-11-05 DIAGNOSIS — J0100 Acute maxillary sinusitis, unspecified: Secondary | ICD-10-CM | POA: Diagnosis not present

## 2022-11-05 DIAGNOSIS — J4540 Moderate persistent asthma, uncomplicated: Secondary | ICD-10-CM | POA: Diagnosis not present

## 2022-11-05 NOTE — Assessment & Plan Note (Addendum)
Continue Advair. Use albuterol for rescue. We discussed action plan for asthma exacerbation

## 2022-11-05 NOTE — Patient Instructions (Signed)
  Continue on advair  OTC mucinex twice daily x 5 days

## 2022-11-05 NOTE — Progress Notes (Signed)
   Subjective:    Patient ID: Caitlin Tanner, female    DOB: 02/18/1951, 72 y.o.   MRN: [id number]  HPI  72 yo remote smoker with asthma for FU of "restrictive lung disease" She was evaluated in 2015 at the pulmonary clinic for multiple nodules which were noted to be benign   She reports asthma onset in her 43s with repeated attacks of asthmatic bronchitis especially in the spring and fall.  She was under the care of Dr. Neldon Mc initially and now with Dr. Ernst Bowler.  She has been on steroid inhalers for more than 10 years, initially [place] and Breztri.now on  Advair    PFTs : There is 1 spirometry in the past which does show a low FEV1 ratio, mild variability in FEV1.  Chief Complaint  Patient presents with   Follow-up    Feels she is doing well. Thinks some of her breathing concerns could be allergy related    Annual follow-up visit. Reviewed her visit with allergist 08/2022 She is maintained on Advair and continues to use Dymista nasal spray OTC.  Previously she was on Breztri She complains of head congestion. She has been given ciprofloxacin for UTI  Significant tests/ events reviewed  Spirometry: 11/2020 FEV1: 1.24/50%, FVC: 1.66/50%, FEV1/FVC: 75 02/2020 spirometry ratio 66, FEV1 1.14/44% 07/2012 FEV1 was 1.4 L/54% predicted.  alpha-1 antitrypsin and MM type and normal levels.   IgE 206   08/2021 CT chest wo con >> scattered areas of peribronchovascular tree-in-bud nodular clusters   HRCT 12/2020 no ILD, 6 mm LUL nodule   05/2015 CT chest Stable bilateral pulmonary nodularity appear benign.  Stable mild bronchiectasis and linear scarring at the lung bases. Compared to 02/2014  Review of Systems neg for any significant sore throat, dysphagia, itching, sneezing, nasal congestion or excess/ purulent secretions, fever, chills, sweats, unintended wt loss, pleuritic or exertional cp, hempoptysis, orthopnea pnd or change in chronic leg swelling. Also denies presyncope, palpitations,  heartburn, abdominal pain, nausea, vomiting, diarrhea or change in bowel or urinary habits, dysuria,hematuria, rash, arthralgias, visual complaints, headache, numbness weakness or ataxia.     Objective:   Physical Exam  Gen. Pleasant, elderly, well-nourished, in no distress ENT - no thrush, no pallor/icterus,no post nasal drip Neck: No JVD, no thyromegaly, no carotid bruits Lungs: no use of accessory muscles, no dullness to percussion, clear without rales or rhonchi  Cardiovascular: Rhythm regular, heart sounds  normal, no murmurs or gallops, no peripheral edema Musculoskeletal: No deformities, no cyanosis or clubbing         Assessment & Plan:

## 2022-11-05 NOTE — Assessment & Plan Note (Signed)
She is on ciprofloxacin already for UTI. Asked her to take Mucinex OTC and continue Dymista nasal spray

## 2022-11-13 ENCOUNTER — Encounter: Payer: Self-pay | Admitting: *Deleted

## 2022-11-25 ENCOUNTER — Other Ambulatory Visit: Payer: Self-pay | Admitting: Family Medicine

## 2022-12-11 ENCOUNTER — Telehealth (INDEPENDENT_AMBULATORY_CARE_PROVIDER_SITE_OTHER): Payer: Self-pay | Admitting: *Deleted

## 2022-12-11 NOTE — Telephone Encounter (Signed)
Procedure: Colonoscopy  Height: 5'7 Weight: 175lbs       Have you had a colonoscopy before?  12/15/19, Dr. Gala Romney  Do you have family history of colon cancer?  no  Do you have a family history of polyps? yes  Previous colonoscopy with polyps removed? yes  Do you have a history colorectal cancer?   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 no  Do you have a pacemaker/defibrillator?   no  Have you had endocarditis/atrial fibrillation?  no  Do you use supplemental oxygen/CPAP?  no  Have you had joint replacement within the last 12 months?  no  Do you tend to be constipated or have to use laxatives?  no   Do you have history of alcohol use? If yes, how much and how often.  no  Do you have history or are you using drugs? If yes, what do are you  using?  no  Have you ever had a stroke/heart attack?  no  Have you ever had a heart or other vascular stent placed,?no  Do you take weight loss medication? no  female patients,: have you had a hysterectomy? no                              are you post menopausal?                                do you still have your menstrual cycle? no    Date of last menstrual period?   Do you take any blood-thinning medications such as: (Plavix, aspirin, Coumadin, Aggrenox, Brilinta, Xarelto, Eliquis, Pradaxa, Savaysa or Effient)? Aspirin 81  If yes we need the name, milligram, dosage and who is prescribing doctor:               Current Outpatient Medications  Medication Sig Dispense Refill   albuterol (VENTOLIN HFA) 108 (90 Base) MCG/ACT inhaler USE 2 PUFFS EVERY 4 HOURS AS NEEDED FOR COUGH OR WHEEZE. MAY USE 2 PUFFS 10-20 MINUTES PRIOR TO EXERCISE 18 each 1   aspirin 81 MG EC tablet Take by mouth.     Azelastine-Fluticasone (DYMISTA) 137-50 MCG/ACT SUSP Place 2 sprays into the nose 2 (two) times daily. Use one spray in each nostril one to two times a day as directed. 23 g 5   bismuth subsalicylate  (PEPTO BISMOL) 262 MG/15ML suspension Take 30 mLs by mouth daily as needed for indigestion or diarrhea or loose stools.      calcium carbonate (TUMS - DOSED IN MG ELEMENTAL CALCIUM) 500 MG chewable tablet Chew 1 tablet by mouth as needed for indigestion or heartburn.     Cholecalciferol (VITAMIN D3) 25 MCG (1000 UT) CAPS Take 2,000 Units by mouth daily.      Cranberry-Vitamin C-Probiotic (AZO CRANBERRY PO) Take 1 tablet by mouth daily as needed (UTI).      escitalopram (LEXAPRO) 10 MG tablet Take 10 mg by mouth daily.      ezetimibe (ZETIA) 10 MG tablet Take 10 mg by mouth daily.     fluticasone-salmeterol (ADVAIR HFA) 230-21 MCG/ACT inhaler Inhale 2 puffs into the lungs 2 (two) times daily. 12 g 5   GARLIC PO      loratadine (CLARITIN) 10 MG tablet Take 1 tablet (10 mg total) by mouth daily as needed  for allergies. 90 tablet 1   LORazepam (ATIVAN) 0.5 MG tablet Take 0.5 mg by mouth daily as needed for anxiety.      losartan-hydrochlorothiazide (HYZAAR) 50-12.5 MG per tablet Take 1 tablet by mouth daily.     meloxicam (MOBIC) 7.5 MG tablet Take 7.5 mg by mouth 2 (two) times daily.     metFORMIN (GLUCOPHAGE) 500 MG tablet Take 500 mg by mouth 2 (two) times daily.     metoprolol (TOPROL-XL) 50 MG 24 hr tablet Take 50 mg by mouth daily.     multivitamin (THERAGRAN) per tablet Take 1 tablet by mouth daily.     Omega-3 Fatty Acids (FISH OIL) 1000 MG CAPS Take 2,000 mg by mouth daily.     pantoprazole (PROTONIX) 40 MG tablet Take 1 tablet (40 mg total) by mouth daily. 90 tablet 1   tretinoin (RETIN-A) 0.1 % cream Apply 1 application topically at bedtime.     Zinc Acetate 50 MG CAPS Take by mouth.     No current facility-administered medications for this visit.    Allergies  Allergen Reactions   Conj Estrog-Medroxyprogest Ace     REACTION: UNKNOWN REACTION   Ketorolac Tromethamine     Felt like she had "bugs in her head"   Sulfa Antibiotics Nausea And Vomiting   Tramadol Hcl Nausea Only

## 2022-12-18 NOTE — Telephone Encounter (Signed)
OK to schedule. ASA 2.   BMP prior to procedure.   1 day prior: Take metformin in the morning only.  Day of: No AM diabetes medications.

## 2022-12-19 ENCOUNTER — Encounter: Payer: Self-pay | Admitting: *Deleted

## 2022-12-19 ENCOUNTER — Other Ambulatory Visit: Payer: Self-pay | Admitting: *Deleted

## 2022-12-19 DIAGNOSIS — Z86010 Personal history of colonic polyps: Secondary | ICD-10-CM

## 2022-12-19 MED ORDER — PEG 3350-KCL-NA BICARB-NACL 420 G PO SOLR: 4000.0000 mL | Freq: Once | ORAL | 0 refills | Status: AC

## 2022-12-19 NOTE — Telephone Encounter (Signed)
Pt has been scheduled for 01/01/23 with Dr.Rourk. Instructions will be picked up by pt next week. Prep sent to pharmacy

## 2022-12-19 NOTE — Telephone Encounter (Signed)
Questionnaire from recall, no referral needed  

## 2022-12-22 ENCOUNTER — Other Ambulatory Visit: Payer: Self-pay

## 2022-12-22 ENCOUNTER — Ambulatory Visit (INDEPENDENT_AMBULATORY_CARE_PROVIDER_SITE_OTHER): Payer: Medicare Other | Admitting: Internal Medicine

## 2022-12-22 ENCOUNTER — Encounter: Payer: Self-pay | Admitting: Internal Medicine

## 2022-12-22 VITALS — BP 138/82 | HR 83 | Temp 98.0°F | Resp 20 | Ht 67.0 in | Wt 179.0 lb

## 2022-12-22 DIAGNOSIS — J310 Chronic rhinitis: Secondary | ICD-10-CM | POA: Diagnosis not present

## 2022-12-22 DIAGNOSIS — K219 Gastro-esophageal reflux disease without esophagitis: Secondary | ICD-10-CM

## 2022-12-22 DIAGNOSIS — R052 Subacute cough: Secondary | ICD-10-CM | POA: Diagnosis not present

## 2022-12-22 DIAGNOSIS — J4540 Moderate persistent asthma, uncomplicated: Secondary | ICD-10-CM

## 2022-12-22 MED ORDER — ALBUTEROL SULFATE HFA 108 (90 BASE) MCG/ACT IN AERS: 2.0000 | INHALATION_SPRAY | Freq: Four times a day (QID) | RESPIRATORY_TRACT | 1 refills | Status: DC | PRN

## 2022-12-22 MED ORDER — AZELASTINE-FLUTICASONE 137-50 MCG/ACT NA SUSP: 2.0000 | Freq: Two times a day (BID) | NASAL | 5 refills | Status: DC

## 2022-12-22 MED ORDER — FLUTICASONE-SALMETEROL 230-21 MCG/ACT IN AERO: 2.0000 | INHALATION_SPRAY | Freq: Two times a day (BID) | RESPIRATORY_TRACT | 5 refills | Status: DC

## 2022-12-22 MED ORDER — LORATADINE 10 MG PO TABS: 10.0000 mg | ORAL_TABLET | Freq: Every day | ORAL | 1 refills | Status: DC

## 2022-12-22 NOTE — Progress Notes (Signed)
FOLLOW UP Date of Service/Encounter:  12/22/22   Subjective:  Caitlin Tanner (DOB: Sep 01, 1951) is a 72 y.o. female who returns to the [facility] on 12/22/2022 for follow up for an acute visit.   History obtained from: chart review and patient. Last visit was with Gareth Morgan for moderate persistent asthma, chronic rhinitis and GERD.  On Advair 130mcg 2 puffs BID.  Also on Dymista Claritin, Protonix.  Was controlled.   Ongoing for almost 3 weeks with wet cough, lots of post nasal drainage and rhinorrhea, chest tenderness from coughing, some ear pain but also has TMJ.  Has also had some questionable wheezing.  No fevers/chills. No sick contacts.  Thinks it is her allergies flaring up.   She is using Dymista/Claritin daily, not doing rinses.  Using Advair 2 puffs daily and also tried using her Albuterol about 2 days ago with minimal relief.   Reports doing breathing exercises at home.    Past Medical History: Past Medical History:  Diagnosis Date   Anxiety disorder    Asthma    Borderline diabetes    Diverticulosis    GERD (gastroesophageal reflux disease)    Hemorrhoids    Hypertension    IBS (irritable bowel syndrome)    S/P colonoscopy 12/23/2010   Dr Rourk-diverticulosis, anal canal hemorhhoids, anal papilla, random bx benign   S/P endoscopy 12/23/2010   Dr Girard Cooter, tubular esophagus, sm HH, minimal retained food, bile-stained mucus, otherwise normal,, esophageal bx  benign    Objective:  BP 138/82   Pulse 83   Temp 98 F (36.7 C)   Resp 20   Ht 5\' 7"  (1.702 m)   Wt 179 lb (81.2 kg)   SpO2 95%   BMI 28.04 kg/m  Body mass index is 28.04 kg/m. Physical Exam: GEN: alert, well developed HEENT: clear conjunctiva, TM grey and translucent, nose with mild inferior turbinate hypertrophy, pink nasal mucosa, clear rhinorrhea, + cobblestoning HEART: regular rate and rhythm, no murmur LUNGS: clear to auscultation bilaterally, + wet cough, unlabored  respiration SKIN: no rashes or lesions  Spirometry:  Tracings reviewed. Her effort: It was hard to get consistent efforts and there is a question as to whether this reflects a maximal maneuver. FVC: 1.79L FEV1: 1.20L, 52% predicted FEV1/FVC ratio: 67% Interpretation: Spirometry consistent with mixed obstructive and restrictive disease.  Please see scanned spirometry results for details.  Assessment:   1. Moderate persistent asthma, uncomplicated   2. Chronic rhinitis   3. Gastroesophageal reflux disease, unspecified whether esophagitis present   4. Subacute cough     Plan/Recommendations:   Moderate Persistent Asthma - Spirometry with mixed restriction/obstruction but similar to prior.  No wheezing on exam today so will try to increase inhaled steroids.  Discussed increasing Advair dosing to BID.  If persistent cough/wheeze, will consider oral prednisone course/CXR.   - Daily controller medication(s): increase to Advair 115-two puffs twice daily with spacer - Prior to physical activity: albuterol 2 puffs 10-15 minutes before physical activity. - Rescue medications: albuterol 4 puffs every 4-6 hours as needed - Asthma control goals:  * Full participation in all desired activities (may need albuterol before activity) * Albuterol use two time or less a week on average (not counting use with activity) * Cough interfering with sleep two time or less a month * Oral steroids no more than once a year * No hospitalizations - Follow up with Dr. Elsworth Soho, pulmonary specialist, as recommended (around January 2024)  Chronic rhinitis -  No recent aeroallergen testing but if symptoms persist, discuss obtain bloodwork or skin test for repeat testing.  Discussed this could be related to flare up of allergies vs. Possible viral infection.  - Use nasal saline rinses before nose sprays such as with Neilmed Sinus Rinse.  Use distilled water.   - Continue Dymista nasal spray- 2 sprays each nostril twice a  day. Can wean off to daily once better.   - Use Loratadine 10 mg daily.  - Use Mucinex up to twice daily as needed.   Reflux - Continue Protonix once a day to help with reflux as previously prescribed - Continue dietary and lifestyle modifications  Keep routine follow up.   Harlon Flor, MD [facility]

## 2022-12-22 NOTE — Patient Instructions (Addendum)
Asthma - Daily controller medication(s): increase to Advair 115-two puffs twice daily with spacer - Prior to physical activity: albuterol 2 puffs 10-15 minutes before physical activity. - Rescue medications: albuterol 4 puffs every 4-6 hours as needed - Asthma control goals:  * Full participation in all desired activities (may need albuterol before activity) * Albuterol use two time or less a week on average (not counting use with activity) * Cough interfering with sleep two time or less a month * Oral steroids no more than once a year * No hospitalizations - Follow up with Dr. Elsworth Soho, pulmonary specialist, as recommended (around January 2024)  Chronic rhinitis - Use nasal saline rinses before nose sprays such as with Neilmed Sinus Rinse.  Use distilled water.   - Continue Dymista nasal spray- 2 sprays each nostril twice a day. Can wean off to daily once better.   - Use Loratadine 10 mg daily.  - Use Mucinex up to twice daily as needed.   Reflux - Continue Protonix once a day to help with reflux as previously prescribed - Continue dietary and lifestyle modifications  Follow up around June 2024.

## 2022-12-23 ENCOUNTER — Telehealth: Payer: Self-pay | Admitting: *Deleted

## 2022-12-23 ENCOUNTER — Encounter: Payer: Self-pay | Admitting: *Deleted

## 2022-12-23 ENCOUNTER — Other Ambulatory Visit: Payer: Self-pay | Admitting: *Deleted

## 2022-12-23 ENCOUNTER — Other Ambulatory Visit (HOSPITAL_COMMUNITY)
Admission: RE | Admit: 2022-12-23 | Discharge: 2022-12-23 | Disposition: A | Discharge status: Home / Self Care | Payer: Medicare Other | Source: Ambulatory Visit | Attending: Internal Medicine | Admitting: Internal Medicine

## 2022-12-23 DIAGNOSIS — Z86010 Personal history of colonic polyps: Secondary | ICD-10-CM | POA: Insufficient documentation

## 2022-12-23 LAB — BASIC METABOLIC PANEL
Anion gap: 8 (ref 5–15)
BUN: 25 mg/dL — ABNORMAL HIGH (ref 8–23)
CO2: 27 mmol/L (ref 22–32)
Calcium: 9.1 mg/dL (ref 8.9–10.3)
Chloride: 100 mmol/L (ref 98–111)
Creatinine, Ser: 1.02 mg/dL — ABNORMAL HIGH (ref 0.44–1.00)
GFR, Estimated: 58 mL/min — ABNORMAL LOW (ref >60)
Glucose, Bld: 150 mg/dL — ABNORMAL HIGH (ref 70–99)
Potassium: 3.9 mmol/L (ref 3.5–5.1)
Sodium: 135 mmol/L (ref 135–145)

## 2022-12-23 MED ORDER — NA SULFATE-K SULFATE-MG SULF 17.5-3.13-1.6 GM/177ML PO SOLN: ORAL | 0 refills | Status: DC

## 2022-12-23 NOTE — Telephone Encounter (Signed)
Pt called in and requested to have different prep because last time she vomited up the trilyte. Will send in [place] and send new instructions via mychart.

## 2023-01-01 ENCOUNTER — Ambulatory Visit (HOSPITAL_BASED_OUTPATIENT_CLINIC_OR_DEPARTMENT_OTHER): Payer: Medicare Other | Admitting: Anesthesiology

## 2023-01-01 ENCOUNTER — Ambulatory Visit (HOSPITAL_COMMUNITY): Payer: Medicare Other | Admitting: Anesthesiology

## 2023-01-01 ENCOUNTER — Ambulatory Visit (HOSPITAL_COMMUNITY)
Admission: RE | Admit: 2023-01-01 | Discharge: 2023-01-01 | Disposition: A | Discharge status: Home / Self Care | Payer: Medicare Other | Attending: Internal Medicine | Admitting: Internal Medicine

## 2023-01-01 ENCOUNTER — Encounter (HOSPITAL_COMMUNITY)
Admission: RE | Disposition: A | Discharge status: Home / Self Care | Payer: Self-pay | Source: Home / Self Care | Attending: Internal Medicine

## 2023-01-01 ENCOUNTER — Other Ambulatory Visit: Payer: Self-pay

## 2023-01-01 ENCOUNTER — Encounter (HOSPITAL_COMMUNITY): Payer: Self-pay | Admitting: Internal Medicine

## 2023-01-01 DIAGNOSIS — K635 Polyp of colon: Secondary | ICD-10-CM | POA: Diagnosis not present

## 2023-01-01 DIAGNOSIS — D122 Benign neoplasm of ascending colon: Secondary | ICD-10-CM

## 2023-01-01 DIAGNOSIS — K5730 Diverticulosis of large intestine without perforation or abscess without bleeding: Secondary | ICD-10-CM

## 2023-01-01 DIAGNOSIS — K589 Irritable bowel syndrome without diarrhea: Secondary | ICD-10-CM | POA: Insufficient documentation

## 2023-01-01 DIAGNOSIS — F419 Anxiety disorder, unspecified: Secondary | ICD-10-CM | POA: Insufficient documentation

## 2023-01-01 DIAGNOSIS — Z09 Encounter for follow-up examination after completed treatment for conditions other than malignant neoplasm: Secondary | ICD-10-CM | POA: Diagnosis present

## 2023-01-01 DIAGNOSIS — I10 Essential (primary) hypertension: Secondary | ICD-10-CM | POA: Insufficient documentation

## 2023-01-01 DIAGNOSIS — Z1211 Encounter for screening for malignant neoplasm of colon: Secondary | ICD-10-CM | POA: Diagnosis not present

## 2023-01-01 DIAGNOSIS — K649 Unspecified hemorrhoids: Secondary | ICD-10-CM | POA: Diagnosis not present

## 2023-01-01 DIAGNOSIS — Z86010 Personal history of colonic polyps: Secondary | ICD-10-CM | POA: Diagnosis not present

## 2023-01-01 DIAGNOSIS — K641 Second degree hemorrhoids: Secondary | ICD-10-CM | POA: Diagnosis not present

## 2023-01-01 DIAGNOSIS — Z87891 Personal history of nicotine dependence: Secondary | ICD-10-CM | POA: Diagnosis not present

## 2023-01-01 DIAGNOSIS — K219 Gastro-esophageal reflux disease without esophagitis: Secondary | ICD-10-CM | POA: Insufficient documentation

## 2023-01-01 HISTORY — PX: POLYPECTOMY: SHX5525

## 2023-01-01 HISTORY — PX: COLONOSCOPY WITH PROPOFOL: SHX5780

## 2023-01-01 LAB — GLUCOSE, CAPILLARY: Glucose-Capillary: 147 mg/dL — ABNORMAL HIGH (ref 70–99)

## 2023-01-01 SURGERY — COLONOSCOPY WITH PROPOFOL
Anesthesia: General

## 2023-01-01 MED ORDER — PROPOFOL 10 MG/ML IV BOLUS
INTRAVENOUS | Status: DC | PRN
  Administered 2023-01-01: 140 mg via INTRAVENOUS
  Administered 2023-01-01: 50 mg via INTRAVENOUS
  Administered 2023-01-01: 30 mg via INTRAVENOUS

## 2023-01-01 MED ORDER — LACTATED RINGERS IV SOLN: INTRAVENOUS | Status: DC

## 2023-01-01 NOTE — Transfer of Care (Signed)
Immediate Anesthesia Transfer of Care Note  Patient: Caitlin Tanner  Procedure(s) Performed: COLONOSCOPY WITH PROPOFOL POLYPECTOMY  Patient Location: Endoscopy Unit  Anesthesia Type:General  Level of Consciousness: awake and patient cooperative  Airway & Oxygen Therapy: Patient Spontanous Breathing  Post-op Assessment: Report given to RN and Post -op Vital signs reviewed and stable  Post vital signs: Reviewed and stable  Last Vitals:  Vitals Value Taken Time  BP 117/59 01/01/23 1228  Temp 36.5 C 01/01/23 1228  Pulse 79 01/01/23 1228  Resp 18 01/01/23 1228  SpO2 96 % 01/01/23 1228    Last Pain:  Vitals:   01/01/23 1228  TempSrc: Oral  PainSc: 0-No pain      Patients Stated Pain Goal: 7 ([id number])  Complications: No notable events documented.

## 2023-01-01 NOTE — Discharge Instructions (Addendum)
  Colonoscopy Discharge Instructions  Read the instructions outlined below and refer to this sheet in the next few weeks. These discharge instructions provide you with general information on caring for yourself after you leave the hospital. Your doctor may also give you specific instructions. While your treatment has been planned according to the most current medical practices available, unavoidable complications occasionally occur. If you have any problems or questions after discharge, call Dr. Gala Romney at [phone]. ACTIVITY You may resume your regular activity, but move at a slower pace for the next 24 hours.  Take frequent rest periods for the next 24 hours.  Walking will help get rid of the air and reduce the bloated feeling in your belly (abdomen).  No driving for 24 hours (because of the medicine (anesthesia) used during the test).   Do not sign any important legal documents or operate any machinery for 24 hours (because of the anesthesia used during the test).  NUTRITION Drink plenty of fluids.  You may resume your normal diet as instructed by your doctor.  Begin with a light meal and progress to your normal diet. Heavy or fried foods are harder to digest and may make you feel sick to your stomach (nauseated).  Avoid alcoholic beverages for 24 hours or as instructed.  MEDICATIONS You may resume your normal medications unless your doctor tells you otherwise.  WHAT YOU CAN EXPECT TODAY Some feelings of bloating in the abdomen.  Passage of more gas than usual.  Spotting of blood in your stool or on the toilet paper.  IF YOU HAD POLYPS REMOVED DURING THE COLONOSCOPY: No aspirin products for 7 days or as instructed.  No alcohol for 7 days or as instructed.  Eat a soft diet for the next 24 hours.  FINDING OUT THE RESULTS OF YOUR TEST Not all test results are available during your visit. If your test results are not back during the visit, make an appointment with your caregiver to find out the  results. Do not assume everything is normal if you have not heard from your caregiver or the medical facility. It is important for you to follow up on all of your test results.  SEEK IMMEDIATE MEDICAL ATTENTION IF: You have more than a spotting of blood in your stool.  Your belly is swollen (abdominal distention).  You are nauseated or vomiting.  You have a temperature over 101.  You have abdominal pain or discomfort that is severe or gets worse throughout the day.       1 polyp found and removed today  Diverticulosis and colon polyp information provided   further recommendations to follow pending review of pathology report   at patient request, I called Mikeal Hawthorne at [phone] -   Discussed findings and recommendations.   patient may well be a good hemorrhoid banding candidate.  Provided hemorrhoid banding  pamphlet upon discharge.    Office visit with Roseanne Kaufman in 6 weeks.

## 2023-01-01 NOTE — Anesthesia Preprocedure Evaluation (Signed)
Anesthesia Evaluation  Patient identified by MRN, date of birth, ID band Patient awake    Reviewed: Allergy & Precautions, H&P , NPO status , Patient's Chart, lab work & pertinent test results, reviewed documented beta blocker date and time   Airway Mallampati: II  TM Distance: >3 FB Neck ROM: full    Dental no notable dental hx.    Pulmonary neg pulmonary ROS, asthma , former smoker   Pulmonary exam normal breath sounds clear to auscultation       Cardiovascular Exercise Tolerance: Good hypertension, negative cardio ROS  Rhythm:regular Rate:Normal     Neuro/Psych  PSYCHIATRIC DISORDERS Anxiety     negative neurological ROS  negative psych ROS   GI/Hepatic negative GI ROS, Neg liver ROS,GERD  ,,  Endo/Other  negative endocrine ROS    Renal/GU negative Renal ROS  negative genitourinary   Musculoskeletal negative musculoskeletal ROS (+)    Abdominal   Peds negative pediatric ROS (+)  Hematology negative hematology ROS (+)   Anesthesia Other Findings   Reproductive/Obstetrics negative OB ROS                             Anesthesia Physical Anesthesia Plan  ASA: 2  Anesthesia Plan: General   Post-op Pain Management:    Induction:   PONV Risk Score and Plan: Propofol infusion  Airway Management Planned:   Additional Equipment:   Intra-op Plan:   Post-operative Plan:   Informed Consent: I have reviewed the patients History and Physical, chart, labs and discussed the procedure including the risks, benefits and alternatives for the proposed anesthesia with the patient or authorized representative who has indicated his/her understanding and acceptance.     Dental Advisory Given  Plan Discussed with: CRNA  Anesthesia Plan Comments:        Anesthesia Quick Evaluation

## 2023-01-01 NOTE — Op Note (Addendum)
[facility] Patient Name: Caitlin Tanner Procedure Date: 01/01/2023 11:52 AM MRN: [id number] Date of Birth: 1950-10-21 Attending MD: Caitlin Tanner , MD, [id number] CSN: [id number] Age: 72 Admit Type: Outpatient Procedure:                Colonoscopy Indications:              High risk colon cancer surveillance: Personal                            history of colonic polyps Providers:                Caitlin Richards, MD, Caitlin Lowenstein, RN, Caitlin Tanner, Technician Referring MD:              Medicines:                Propofol per Anesthesia Complications:            No immediate complications. Estimated Blood Loss:     Estimated blood loss was minimal. Procedure:                Pre-Anesthesia Assessment:                           - Prior to the procedure, a History and Physical                            was performed, and patient medications and                            allergies were reviewed. The patient's tolerance of                            previous anesthesia was also reviewed. The risks                            and benefits of the procedure and the sedation                            options and risks were discussed with the patient.                            All questions were answered, and informed consent                            was obtained. Prior Anticoagulants: The patient has                            taken no anticoagulant or antiplatelet agents. ASA                            Grade Assessment: II - A patient with mild systemic  disease. After reviewing the risks and benefits,                            the patient was deemed in satisfactory condition to                            undergo the procedure.                           After obtaining informed consent, the colonoscope                            was passed under direct vision. Throughout the                            procedure, the  patient's blood pressure, pulse, and                            oxygen saturations were monitored continuously. The                            [id number]) scope was introduced through the                            anus and advanced to the the cecum, identified by                            appendiceal orifice and ileocecal valve. The                            colonoscopy was performed without difficulty. The                            patient tolerated the procedure well. The quality                            of the bowel preparation was adequate. The                            ileocecal valve, appendiceal orifice, and rectum                            were photographed. Scope In: 12:12:22 PM Scope Out: 12:23:37 PM Scope Withdrawal Time: 0 hours 7 minutes 2 seconds  Total Procedure Duration: 0 hours 11 minutes 15 seconds  Findings:      Hemorrhoids were found on perianal exam.      Scattered small-mouthed diverticula were found in the entire colon.      A 7 mm polyp was found in the proximal ascending colon. The polyp was       semi-pedunculated. The polyp was removed with a cold snare. Resection       and retrieval were complete.      Non-bleeding internal hemorrhoids were found during retroflexion. The       hemorrhoids were moderate, medium-sized and Grade II (internal  hemorrhoids that prolapse but reduce spontaneously).      The exam was otherwise without abnormality on direct and retroflexion       views. Impression:               - Hemorrhoids found on perianal exam.                           - Diverticulosis in the entire examined colon.                           - One 7 mm polyp in the proximal ascending colon,                            removed with a cold snare. Resected and retrieved.                           - Non-bleeding internal hemorrhoids.                           - The examination was otherwise normal on direct                            and  retroflexion views. Moderate Sedation:      Moderate (conscious) sedation was personally administered by an       anesthesia professional. The following parameters were monitored: oxygen       saturation, heart rate, blood pressure, and response to care. Total       physician intraservice time was 15 minutes. Recommendation:           - Repeat colonoscopy date to be determined after                            pending pathology results are reviewed for                            surveillance.                           - Return to GI office (date not yet determined).                            Patient interested in hemorrhoid banding. Will                            provide pamphlet on hemorrhoid banding and have her                            return to see Caitlin Tanner in 6 weeks Procedure Code(s):        --- Professional ---                           [phone], Colonoscopy, flexible; with removal of                            tumor(s), polyp(s), or other lesion(s) by  snare                            technique Diagnosis Code(s):        --- Professional ---                           Z86.010, Personal history of colonic polyps                           K64.1, Second degree hemorrhoids                           D12.2, Benign neoplasm of ascending colon                           K57.30, Diverticulosis of large intestine without                            perforation or abscess without bleeding CPT copyright 2022 [employer]. All rights reserved. The codes documented in this report are preliminary and upon coder review may  be revised to meet current compliance requirements. Caitlin Tanner. Caitlin Laughman, MD Caitlin Richards, MD 01/01/2023 12:28:05 PM This report has been signed electronically. Number of Addenda: 0

## 2023-01-01 NOTE — Anesthesia Procedure Notes (Signed)
Date/Time: 01/01/2023 12:06 PM  Performed by: Vista Deck, CRNAPre-anesthesia Checklist: Patient identified, Emergency Drugs available, Suction available, Timeout performed and Patient being monitored Patient Re-evaluated:Patient Re-evaluated prior to induction Oxygen Delivery Method: Nasal Cannula

## 2023-01-01 NOTE — H&P (Signed)
@LOGO @   Primary Care Physician:  Thea Alken Primary Gastroenterologist:  Dr. Gala Romney  Pre-Procedure History & Physical: HPI:  Caitlin Tanner is a 72 y.o. female here for   For surveillance colonoscopy.  History of multiple adenomas and serrated polyp removed 3 years ago.  Past Medical History:  Diagnosis Date   Anxiety disorder    Asthma    Borderline diabetes    Diverticulosis    GERD (gastroesophageal reflux disease)    Hemorrhoids    Hypertension    IBS (irritable bowel syndrome)    S/P colonoscopy 12/23/2010   Dr Kennedy Brines-diverticulosis, anal canal hemorhhoids, anal papilla, random bx benign   S/P endoscopy 12/23/2010   Dr Girard Cooter, tubular esophagus, sm HH, minimal retained food, bile-stained mucus, otherwise normal,, esophageal bx  benign    Past Surgical History:  Procedure Laterality Date   CHOLECYSTECTOMY     cold knife conization     COLONOSCOPY  06/08/2006   internal hemorrhoids otherwise noraml colon and rectum   COLONOSCOPY  11/2010   Dr. Gala Romney: Anal canal hemorrhoids, diverticulosis, random colon biopsies unremarkable.   COLONOSCOPY WITH PROPOFOL N/A 12/15/2019   Procedure: COLONOSCOPY WITH PROPOFOL;  Surgeon: Daneil Dolin, MD;  [facility];  Service: Endoscopy;  Laterality: N/A;  9:00am   egd/tcs  11/2010   see PMH   ESOPHAGOGASTRODUODENOSCOPY  06/08/2006   single distal erosion otherwise normal   ESOPHAGOGASTRODUODENOSCOPY  11/2010   Dr. Gala Romney: Longitudinal for reading of the tubular esophagus, no evidence of eosinophilic esophagitis on biopsy, small hiatal hernia, minimal amount of retained food, bile-stained mucosa.   ESOPHAGOGASTRODUODENOSCOPY (EGD) WITH PROPOFOL N/A 12/15/2019   Procedure: ESOPHAGOGASTRODUODENOSCOPY (EGD) WITH PROPOFOL;  Surgeon: Daneil Dolin, MD;  [facility];  Service: Endoscopy;  Laterality: N/A;   left great toe surgery     POLYPECTOMY  12/15/2019   Procedure: POLYPECTOMY;  Surgeon: Daneil Dolin, MD;   [facility];  Service: Endoscopy;;  colon   TONSILLECTOMY     WRIST FRACTURE SURGERY  07/2013   right    Prior to Admission medications   Medication Sig Start Date End Date Taking? Authorizing Provider  albuterol (VENTOLIN HFA) 108 (90 Base) MCG/ACT inhaler Inhale 2 puffs into the lungs every 6 (six) hours as needed for wheezing or shortness of breath. 12/22/22  Yes Larose Kells, MD  aspirin 81 MG EC tablet Take 81 mg by mouth daily. 09/08/17  Yes [provider]  Azelastine-Fluticasone (DYMISTA) 137-50 MCG/ACT SUSP Place 2 sprays into the nose 2 (two) times daily. 12/22/22  Yes Larose Kells, MD  bismuth subsalicylate (PEPTO BISMOL) 262 MG/15ML suspension Take 30 mLs by mouth daily as needed for indigestion or diarrhea or loose stools.    Yes [provider]  Cholecalciferol (VITAMIN D3) 25 MCG (1000 UT) CAPS Take 2,000 Units by mouth daily.    Yes [provider]  Cranberry-Vitamin C-Probiotic (AZO CRANBERRY PO) Take 1 tablet by mouth in the morning and at bedtime.   Yes [provider]  escitalopram (LEXAPRO) 10 MG tablet Take 10 mg by mouth daily.    Yes [provider]  ezetimibe (ZETIA) 10 MG tablet Take 10 mg by mouth daily.   Yes [provider]  fluticasone-salmeterol (ADVAIR HFA) 230-21 MCG/ACT inhaler Inhale 2 puffs into the lungs 2 (two) times daily. 12/22/22  Yes Larose Kells, MD  GARLIC PO Take 1 capsule by mouth daily. 03/31/20  Yes [provider]  loratadine (CLARITIN) 10 MG tablet Take 1 tablet (10 mg total) by mouth daily. 12/22/22  Yes Larose Kells, MD  losartan-hydrochlorothiazide (HYZAAR) 50-12.5 MG per tablet Take 1 tablet by mouth daily.   Yes [provider]  meloxicam (MOBIC) 7.5 MG tablet Take 7.5 mg by mouth 2 (two) times daily. 06/04/22  Yes [provider]  metFORMIN (GLUCOPHAGE) 500 MG tablet Take 500 mg by mouth See admin instructions. 500 mg in the morning if blood sugar  is above 140, 500 mg every evening 07/15/21  Yes [provider]  metoprolol (TOPROL-XL) 50 MG 24 hr tablet Take 50 mg by mouth daily.   Yes [provider]  Multiple Vitamins-Minerals (PRESERVISION AREDS 2) CAPS Take 1 capsule by mouth in the morning and at bedtime.   Yes [provider]  multivitamin [facility]) per tablet Take 1 tablet by mouth daily.   Yes [provider]  Omega-3 Fatty Acids (FISH OIL) 1000 MG CAPS Take 1,000 mg by mouth daily.   Yes [provider]  pantoprazole (PROTONIX) 40 MG tablet Take 1 tablet (40 mg total) by mouth daily. 09/26/22  Yes Ambs, Kathrine Cords, FNP  calcium carbonate (TUMS - DOSED IN MG ELEMENTAL CALCIUM) 500 MG chewable tablet Chew 1 tablet by mouth as needed for indigestion or heartburn.    [provider]  LORazepam (ATIVAN) 0.5 MG tablet Take 0.5 mg by mouth daily as needed for anxiety.     [provider]  Na Sulfate-K Sulfate-Mg Sulf 17.5-3.13-1.6 GM/177ML SOLN As directed 12/23/22   Khelani Kops, Cristopher Estimable, MD  tretinoin (RETIN-A) 0.05 % cream Apply 1 application  topically at bedtime. 07/18/21   [provider]    Allergies as of 12/19/2022 - Review Complete 12/11/2022  Allergen Reaction Noted   Conj estrog-medroxyprogest ace     Ketorolac tromethamine     Sulfa antibiotics Nausea And Vomiting 09/22/2019   Tramadol hcl Nausea Only     Family History  Problem Relation Age of Onset   Stomach cancer Father 73   Throat cancer Mother 37   Diabetes Brother    Transient ischemic attack Brother    Colon cancer Neg Hx    Allergic rhinitis Neg Hx    Angioedema Neg Hx    Asthma Neg Hx    Atopy Neg Hx    Eczema Neg Hx    Immunodeficiency Neg Hx    Urticaria Neg Hx     Social History   Socioeconomic History   Marital status: Married    Spouse name: Not on file   Number of children: 1   Years of education: Not on file   Highest education level: Not on file  Occupational History     Employer: UNEMPLOYED  Tobacco Use   Smoking status: Former    Packs/day: 0.10    Years: 3.00    Additional pack years: 0.00    Total pack years: 0.30    Types: Cigarettes    Quit date: 09/30/1987    Years since quitting: 35.2   Smokeless tobacco: Never   Tobacco comments:    pt states she smoked socially  Vaping Use   Vaping Use: Never used  Substance and Sexual Activity   Alcohol use: No   Drug use: No   Sexual activity: Not Currently  Other Topics Concern   Not on file  Social History Narrative   Not on file   Social Determinants of Health   Financial Resource Strain:  Not on file  Food Insecurity: Not on file  Transportation Needs: Not on file  Physical Activity: Not on file  Stress: Not on file  Social Connections: Not on file  Intimate Partner Violence: Not on file    Review of Systems: See HPI, otherwise negative ROS  Physical Exam: BP (!) 157/85   Pulse 80   Temp 98.2 F (36.8 C) (Oral)   Resp 18   Wt 77.6 kg   SpO2 96%   BMI 26.78 kg/m  General:   Alert,  Well-developed, well-nourished, pleasant and cooperative in NAD Neck:  Supple; no masses or thyromegaly. No significant cervical adenopathy. Lungs:  Clear throughout to auscultation.   No wheezes, crackles, or rhonchi. No acute distress. Heart:  Regular rate and rhythm; no murmurs, clicks, rubs,  or gallops. Abdomen: Non-distended, normal bowel sounds.  Soft and nontender without appreciable mass or hepatosplenomegaly.  Pulses:  Normal pulses noted. Extremities:  Without clubbing or edema.  Impression/Plan:    72 year old lady with a history of colonic adenoma/serrated polyps here for surveillance colonoscopy.  I have offered the patient a surveillance colonoscopy today per plan. The risks, benefits, limitations, alternatives and imponderables have been reviewed with the patient. Questions have been answered. All parties are agreeable.       Notice: This dictation was prepared with Dragon dictation  along with smaller phrase technology. Any transcriptional errors that result from this process are unintentional and may not be corrected upon review.

## 2023-01-02 LAB — SURGICAL PATHOLOGY

## 2023-01-02 NOTE — Anesthesia Postprocedure Evaluation (Signed)
Anesthesia Post Note  Patient: Caitlin Tanner  Procedure(s) Performed: COLONOSCOPY WITH PROPOFOL POLYPECTOMY  Patient location during evaluation: Phase II Anesthesia Type: General Level of consciousness: awake Pain management: pain level controlled Vital Signs Assessment: post-procedure vital signs reviewed and stable Respiratory status: spontaneous breathing and respiratory function stable Cardiovascular status: blood pressure returned to baseline and stable Postop Assessment: no headache and no apparent nausea or vomiting Anesthetic complications: no Comments: Late entry   No notable events documented.   Last Vitals:  Vitals:   01/01/23 1126 01/01/23 1228  BP: (!) 157/85 (!) 117/59  Pulse: 80 79  Resp: 18 18  Temp: 36.8 C 36.5 C  SpO2: 96% 96%    Last Pain:  Vitals:   01/01/23 1228  TempSrc: Oral  PainSc: 0-No pain                 Windell Norfolk

## 2023-01-03 ENCOUNTER — Encounter: Payer: Self-pay | Admitting: Internal Medicine

## 2023-01-09 ENCOUNTER — Encounter (HOSPITAL_COMMUNITY): Payer: Self-pay | Admitting: Internal Medicine

## 2023-02-12 ENCOUNTER — Encounter: Payer: Self-pay | Admitting: Gastroenterology

## 2023-02-12 ENCOUNTER — Ambulatory Visit (INDEPENDENT_AMBULATORY_CARE_PROVIDER_SITE_OTHER): Payer: Medicare Other | Admitting: Gastroenterology

## 2023-02-12 VITALS — BP 125/73 | HR 72 | Temp 97.3°F | Ht 67.0 in | Wt 175.7 lb

## 2023-02-12 DIAGNOSIS — K219 Gastro-esophageal reflux disease without esophagitis: Secondary | ICD-10-CM | POA: Diagnosis not present

## 2023-02-12 DIAGNOSIS — K642 Third degree hemorrhoids: Secondary | ICD-10-CM

## 2023-02-12 NOTE — Patient Instructions (Signed)
Avoid straining and limit toilet time to 2-3 minutes.  I recommend Benefiber 2 teaspoons daily in the beverage of your choice!  I will see you in a few weeks for hemorrhoid banding!  I enjoyed seeing you again today! I value our relationship and want to provide genuine, compassionate, and quality care. You may receive a survey regarding your visit with me, and I welcome your feedback! Thanks so much for taking the time to complete this. I look forward to seeing you again.      Gelene Mink, PhD, ANP-BC [facility]

## 2023-02-12 NOTE — Progress Notes (Signed)
Gastroenterology Office Note     Primary Care Physician:  Garald Braver  Primary Gastroenterologist: Dr. Jena Gauss    Chief Complaint   Chief Complaint  Patient presents with   Follow-up    Patient here today for a follow up visit. Patient says pantoprazole has helped to control her GERD symptoms. She may have an episode once and a while.      History of Present Illness   Caitlin Tanner is a delightful 72 y.o. female presenting today in follow-up with a history of GERD and recently underwent surveillance colonoscopy. She is here to discuss potential hemorrhoid banding.    Pantoprazole once daily. Takes in morning. Doing well on this. Sometimes will take an evening dose. Feels like fiber has backed her up in the past. Has tried metamucil in the past.   Grade 3 hemorrhoids. Occasional bleeding. No straining.   April 2024 colonoscopy: Hemorrhoids found on perianal exam.                           - Diverticulosis in the entire examined colon.                           - One 7 mm polyp in the proximal ascending colon,                            removed with a cold snare. Resected and retrieved.                           - Non-bleeding internal hemorrhoids.                           - The examination was otherwise normal on direct                            and retroflexion views. (Tubular adenoma. 7 year surveillance  Past Medical History:  Diagnosis Date   Anxiety disorder    Asthma    Borderline diabetes    Diverticulosis    GERD (gastroesophageal reflux disease)    Hemorrhoids    Hypertension    IBS (irritable bowel syndrome)    S/P colonoscopy 12/23/2010   Dr Rourk-diverticulosis, anal canal hemorhhoids, anal papilla, random bx benign   S/P endoscopy 12/23/2010   Dr Mancel Parsons, tubular esophagus, sm HH, minimal retained food, bile-stained mucus, otherwise normal,, esophageal bx  benign    Past Surgical History:  Procedure Laterality Date    CHOLECYSTECTOMY     cold knife conization     COLONOSCOPY  06/08/2006   internal hemorrhoids otherwise noraml colon and rectum   COLONOSCOPY  11/2010   Dr. Jena Gauss: Anal canal hemorrhoids, diverticulosis, random colon biopsies unremarkable.   COLONOSCOPY WITH PROPOFOL N/A 12/15/2019   Procedure: COLONOSCOPY WITH PROPOFOL;  Surgeon: Corbin Ade, MD;  [facility];  Service: Endoscopy;  Laterality: N/A;  9:00am   COLONOSCOPY WITH PROPOFOL N/A 01/01/2023   Procedure: COLONOSCOPY WITH PROPOFOL;  Surgeon: Corbin Ade, MD;  [facility];  Service: Endoscopy;  Laterality: N/A;  12:45 am   egd/tcs  11/2010   see PMH   ESOPHAGOGASTRODUODENOSCOPY  06/08/2006   single distal erosion otherwise normal   ESOPHAGOGASTRODUODENOSCOPY  11/2010   Dr. Jena Gauss: Longitudinal for reading of the tubular esophagus, no evidence of eosinophilic esophagitis on biopsy, small hiatal hernia, minimal amount of retained food, bile-stained mucosa.   ESOPHAGOGASTRODUODENOSCOPY (EGD) WITH PROPOFOL N/A 12/15/2019   Procedure: ESOPHAGOGASTRODUODENOSCOPY (EGD) WITH PROPOFOL;  Surgeon: Corbin Ade, MD;  [facility];  Service: Endoscopy;  Laterality: N/A;   left great toe surgery     POLYPECTOMY  12/15/2019   Procedure: POLYPECTOMY;  Surgeon: Corbin Ade, MD;  [facility];  Service: Endoscopy;;  colon   POLYPECTOMY  01/01/2023   Procedure: POLYPECTOMY;  Surgeon: Corbin Ade, MD;  [facility];  Service: Endoscopy;;   TONSILLECTOMY     WRIST FRACTURE SURGERY  07/2013   right    Current Outpatient Medications  Medication Sig Dispense Refill   albuterol (VENTOLIN HFA) 108 (90 Base) MCG/ACT inhaler Inhale 2 puffs into the lungs every 6 (six) hours as needed for wheezing or shortness of breath. 18 each 1   aspirin 81 MG EC tablet Take 81 mg by mouth daily.     Azelastine-Fluticasone (DYMISTA) 137-50 MCG/ACT SUSP Place 2 sprays into the nose 2 (two) times daily. 23 g 5    bismuth subsalicylate (PEPTO BISMOL) 262 MG/15ML suspension Take 30 mLs by mouth daily as needed for indigestion or diarrhea or loose stools.      Cholecalciferol (VITAMIN D3) 25 MCG (1000 UT) CAPS Take 2,000 Units by mouth daily.      Cranberry-Vitamin C-Probiotic (AZO CRANBERRY PO) Take 1 tablet by mouth in the morning and at bedtime.     escitalopram (LEXAPRO) 10 MG tablet Take 10 mg by mouth daily.      ezetimibe (ZETIA) 10 MG tablet Take 10 mg by mouth daily.     fluticasone-salmeterol (ADVAIR HFA) 230-21 MCG/ACT inhaler Inhale 2 puffs into the lungs 2 (two) times daily. 12 g 5   GARLIC PO Take 1 capsule by mouth daily.     loratadine (CLARITIN) 10 MG tablet Take 1 tablet (10 mg total) by mouth daily. 90 tablet 1   LORazepam (ATIVAN) 0.5 MG tablet Take 0.5 mg by mouth daily as needed for anxiety.      losartan-hydrochlorothiazide (HYZAAR) 50-12.5 MG per tablet Take 1 tablet by mouth daily.     meloxicam (MOBIC) 7.5 MG tablet Take 7.5 mg by mouth 2 (two) times daily.     metFORMIN (GLUCOPHAGE) 500 MG tablet Take 500 mg by mouth See admin instructions. 500 mg in the morning if blood sugar is above 140, 500 mg every evening     metoprolol (TOPROL-XL) 50 MG 24 hr tablet Take 50 mg by mouth daily.     Multiple Vitamins-Minerals (PRESERVISION AREDS 2) CAPS Take 1 capsule by mouth in the morning and at bedtime.     multivitamin (THERAGRAN) per tablet Take 1 tablet by mouth daily.     Omega-3 Fatty Acids (FISH OIL) 1000 MG CAPS Take 1,000 mg by mouth daily.     pantoprazole (PROTONIX) 40 MG tablet Take 1 tablet (40 mg total) by mouth daily. 90 tablet 1   tretinoin (RETIN-A) 0.05 % cream Apply 1 application  topically at bedtime.     No current facility-administered medications for this visit.    Allergies as of 02/12/2023 - Review Complete 02/12/2023  Allergen Reaction Noted   Conj estrog-medroxyprogest ace     Ketorolac tromethamine     Sulfa antibiotics Nausea And Vomiting 09/22/2019    Tramadol hcl  Nausea Only     Family History  Problem Relation Age of Onset   Stomach cancer Father 38   Throat cancer Mother 54   Diabetes Brother    Transient ischemic attack Brother    Colon cancer Neg Hx    Allergic rhinitis Neg Hx    Angioedema Neg Hx    Asthma Neg Hx    Atopy Neg Hx    Eczema Neg Hx    Immunodeficiency Neg Hx    Urticaria Neg Hx     Social History   Socioeconomic History   Marital status: Married    Spouse name: Not on file   Number of children: 1   Years of education: Not on file   Highest education level: Not on file  Occupational History    Employer: UNEMPLOYED  Tobacco Use   Smoking status: Former    Packs/day: 0.10    Years: 3.00    Additional pack years: 0.00    Total pack years: 0.30    Types: Cigarettes    Quit date: 09/30/1987    Years since quitting: 35.3   Smokeless tobacco: Never   Tobacco comments:    pt states she smoked socially  Vaping Use   Vaping Use: Never used  Substance and Sexual Activity   Alcohol use: No   Drug use: No   Sexual activity: Not Currently  Other Topics Concern   Not on file  Social History Narrative   Not on file   Social Determinants of Health   Financial Resource Strain: Not on file  Food Insecurity: Not on file  Transportation Needs: Not on file  Physical Activity: Not on file  Stress: Not on file  Social Connections: Not on file  Intimate Partner Violence: Not on file     Review of Systems   Gen: Denies any fever, chills, fatigue, weight loss, lack of appetite.  CV: Denies chest pain, heart palpitations, peripheral edema, syncope.  Resp: Denies shortness of breath at rest or with exertion. Denies wheezing or cough.  GI: Denies dysphagia or odynophagia. Denies jaundice, hematemesis, fecal incontinence. GU : Denies urinary burning, urinary frequency, urinary hesitancy MS: Denies joint pain, muscle weakness, cramps, or limitation of movement.  Derm: Denies rash, itching, dry skin Psych:  Denies depression, anxiety, memory loss, and confusion Heme: Denies bruising, bleeding, and enlarged lymph nodes.   Physical Exam   BP 125/73 (BP Location: Left Arm, Patient Position: Sitting, Cuff Size: Large)   Pulse 72   Temp (!) 97.3 F (36.3 C) (Temporal)   Ht 5\' 7"  (1.702 m)   Wt 175 lb 11.2 oz (79.7 kg)   BMI 27.52 kg/m  General:   Alert and oriented. Pleasant and cooperative. Well-nourished and well-developed.  Head:  Normocephalic and atraumatic. Eyes:  Without icterus Rectal:  external hemorrhoid tags, no mass on DRE, no pain. Prominent left lateral and right posterior columns.  Msk:  Symmetrical without gross deformities. Normal posture. Extremities:  Without edema. Neurologic:  Alert and  oriented x4;  grossly normal neurologically. Skin:  Intact without significant lesions or rashes. Psych:  Alert and cooperative. Normal mood and affect.   Assessment   Caitlin Tanner is a delightful 72 y.o. female presenting today in follow-up with a history of GERD and symptomatic hemorrhoids to discuss hemorrhoid banding.   Grade 3 hemorrhoids clinically and on exam. She is an excellent candidate for banding. Colonoscopy April 2024 on file as noted above. We discussed in detail the procedure. She  wants to pursue but not today. She is eager to do this in the future.   GERD remains well controlled on pantoprazole once daily, with occasional need for BID.    PLAN    Add Benefiber daily PPI daily No straining, limit toilet time to 2-3 minutes Return in next few weeks for hemorrhoid banding. Will start with left lateral likely   Gelene Mink, PhD, [facility]

## 2023-02-17 ENCOUNTER — Other Ambulatory Visit: Payer: Self-pay | Admitting: Family Medicine

## 2023-02-26 ENCOUNTER — Encounter: Payer: Medicare Other | Admitting: Gastroenterology

## 2023-02-26 ENCOUNTER — Encounter: Payer: Self-pay | Admitting: Gastroenterology

## 2023-02-26 ENCOUNTER — Ambulatory Visit (INDEPENDENT_AMBULATORY_CARE_PROVIDER_SITE_OTHER): Payer: Medicare Other | Admitting: Gastroenterology

## 2023-02-26 ENCOUNTER — Encounter (INDEPENDENT_AMBULATORY_CARE_PROVIDER_SITE_OTHER): Payer: Self-pay

## 2023-02-26 VITALS — BP 135/80 | HR 71 | Temp 98.0°F | Ht 67.0 in | Wt 175.7 lb

## 2023-02-26 DIAGNOSIS — K642 Third degree hemorrhoids: Secondary | ICD-10-CM

## 2023-02-26 NOTE — Progress Notes (Signed)
    [facility] BANDING PROCEDURE NOTE  DENE DANTUONO is a 72 y.o. female presenting today for consideration of hemorrhoid banding. Last colonoscopy April 2024 with hemorrhoids and tubular adenoma with surveillance recommended in 7 years. Notes bleeding, itching, prolapsing, burning.    The patient presents with symptomatic grade 3 hemorrhoids, unresponsive to maximal medical therapy, requesting rubber band ligation of her hemorrhoidal disease. All risks, benefits, and alternative forms of therapy were described and informed consent was obtained.   The decision was made to band the left lateral internal hemorrhoid, but suction was difficult. I then banded neutrally and pulled ligator down more distally, with excellent results, and the [facility] O'Regan System was used to perform band ligation without complication By palpation, felt to be left lateral. Digital anorectal examination was then performed to assure proper positioning of the band, and to adjust the banded tissue as required. The patient was discharged home without pain or other issues. Dietary and behavioral recommendations were given, along with follow-up instructions. The patient will return in several weeks for followup and possible additional banding as required.  No complications were encountered and the patient tolerated the procedure well.   Gelene Mink, PhD, ANP-BC [facility]

## 2023-02-26 NOTE — Patient Instructions (Signed)
  Please avoid straining.  You should limit your toilet time to 2-3 minutes at the most.   I recommend Benefiber 2 teaspoons each morning in the beverage of your choice!  Please call me with any concerns or issues!  I will see you in follow-up for additional banding in several weeks.   I enjoyed seeing you again today! I value our relationship and want to provide genuine, compassionate, and quality care. You may receive a survey regarding your visit with me, and I welcome your feedback! Thanks so much for taking the time to complete this. I look forward to seeing you again.      Tyvion Edmondson W. Ezell Poke, PhD, ANP-BC [facility]       

## 2023-03-10 ENCOUNTER — Encounter: Payer: Self-pay | Admitting: Gastroenterology

## 2023-03-10 ENCOUNTER — Ambulatory Visit (INDEPENDENT_AMBULATORY_CARE_PROVIDER_SITE_OTHER): Payer: Medicare Other | Admitting: Gastroenterology

## 2023-03-10 VITALS — BP 125/77 | HR 77 | Temp 97.5°F | Ht 67.0 in | Wt 174.4 lb

## 2023-03-10 DIAGNOSIS — K642 Third degree hemorrhoids: Secondary | ICD-10-CM | POA: Diagnosis not present

## 2023-03-10 NOTE — Progress Notes (Signed)
    [facility] BANDING PROCEDURE NOTE  Caitlin Tanner is a 72 y.o. female presenting today for consideration of hemorrhoid banding. Last colonoscopy April 2024 with hemorrhoids and tubular adenoma with surveillance recommended in 7 years. Notes bleeding, itching, prolapsing, burning. She has had left lateral banding.    The patient presents with symptomatic grade 3 hemorrhoids, unresponsive to maximal medical therapy, requesting rubber band ligation of her hemorrhoidal disease. All risks, benefits, and alternative forms of therapy were described and informed consent was obtained.   The decision was made to band the right posterior internal hemorrhoid, and the [facility] O'Regan System was used to perform band ligation without complication. Digital anorectal examination was then performed to assure proper positioning of the band, and to adjust the banded tissue as required. The patient was discharged home without pain or other issues. Dietary and behavioral recommendations were given, along with follow-up instructions. The patient will return in several weeks for followup and possible additional banding as required.  No complications were encountered and the patient tolerated the procedure well.   Gelene Mink, PhD, ANP-BC [facility]

## 2023-03-10 NOTE — Patient Instructions (Signed)
  Please avoid straining.  You should limit your toilet time to 2-3 minutes at the most.   I recommend Benefiber 2 teaspoons each morning in the beverage of your choice!  Please call me with any concerns or issues!  I will see you in follow-up for additional banding in several weeks.   I enjoyed seeing you again today! I value our relationship and want to provide genuine, compassionate, and quality care. You may receive a survey regarding your visit with me, and I welcome your feedback! Thanks so much for taking the time to complete this. I look forward to seeing you again.      Maaz Spiering W. Ed Mandich, PhD, ANP-BC [facility]       

## 2023-03-27 ENCOUNTER — Ambulatory Visit (INDEPENDENT_AMBULATORY_CARE_PROVIDER_SITE_OTHER): Payer: Medicare Other | Admitting: Allergy & Immunology

## 2023-03-27 ENCOUNTER — Encounter: Payer: Self-pay | Admitting: Allergy & Immunology

## 2023-03-27 ENCOUNTER — Other Ambulatory Visit: Payer: Self-pay

## 2023-03-27 VITALS — BP 124/80 | HR 83 | Temp 98.0°F | Resp 14 | Ht 66.0 in | Wt 175.1 lb

## 2023-03-27 DIAGNOSIS — J4540 Moderate persistent asthma, uncomplicated: Secondary | ICD-10-CM

## 2023-03-27 DIAGNOSIS — J310 Chronic rhinitis: Secondary | ICD-10-CM

## 2023-03-27 DIAGNOSIS — K219 Gastro-esophageal reflux disease without esophagitis: Secondary | ICD-10-CM

## 2023-03-27 MED ORDER — LORATADINE 10 MG PO TABS: 10.0000 mg | ORAL_TABLET | Freq: Every day | ORAL | 1 refills | Status: DC | PRN

## 2023-03-27 MED ORDER — AZELASTINE-FLUTICASONE 137-50 MCG/ACT NA SUSP: 2.0000 | Freq: Two times a day (BID) | NASAL | 1 refills | Status: DC

## 2023-03-27 MED ORDER — SPACER/AERO-HOLDING CHAMBERS DEVI: 1.0000 | 1 refills | Status: AC

## 2023-03-27 MED ORDER — FLUTICASONE-SALMETEROL 230-21 MCG/ACT IN AERO: 2.0000 | INHALATION_SPRAY | Freq: Two times a day (BID) | RESPIRATORY_TRACT | 1 refills | Status: DC

## 2023-03-27 MED ORDER — ALBUTEROL SULFATE HFA 108 (90 BASE) MCG/ACT IN AERS: 2.0000 | INHALATION_SPRAY | RESPIRATORY_TRACT | 1 refills | Status: DC | PRN

## 2023-03-27 NOTE — Progress Notes (Unsigned)
FOLLOW UP  Date of Service/Encounter:  03/27/23   Assessment:   Moderate persistent asthma, uncomplicated with worsening lung function over the last 6 months or so   Chronic rhinitis   Gastroesophageal reflux disease - on PPI   Left upper lobe lung nodule - needs repeat CT scan in April 2023 (follows with Dr. Vassie Loll)   Recent COVID-19 infection in July 2022 - treated with Paxlovid  Plan/Recommendations:    Patient Instructions  Asthma - Lung testing is around 50% today which is slowly getting worse. - I am going to get some labs to look for weird causes of asthma/breathing problems.  - I am going to talk to Dr. Vassie Loll about what to do with your chest CT.  - We may consider starting an injectable asthma medication. - Daily controller medication(s): Advair two puffs twice daily with spacer - Prior to physical activity: albuterol 2 puffs 10-15 minutes before physical activity. - Rescue medications: albuterol 4 puffs every 4-6 hours as needed - Asthma control goals:  * Full participation in all desired activities (may need albuterol before activity) * Albuterol use two time or less a week on average (not counting use with activity) * Cough interfering with sleep two time or less a month * Oral steroids no more than once a year * No hospitalizations  Chronic rhinitis - Continue saline nasal rinse as needed for nasal symptoms. - Continue Dymista nasal spray-using 2 sprays each nostril twice a day to help with nasal congestion and runny nose. - Continue with loratadine 10mg  once daily AS NEEDED.    Reflux - Continue Protonix once a day to help with reflux. - Continue dietary and lifestyle modifications  Return in about 2 months (around 05/27/2023).    Please inform us of any Emergency Department visits, hospitalizations, or changes in symptoms. Call us before going to the ED for breathing or allergy symptoms since we might be able to fit you in for a sick visit. Feel free to  contact us anytime with any questions, problems, or concerns.  It was a pleasure to see you again today!  Websites that have reliable patient information: 1. American Academy of Asthma, Allergy, and Immunology: www.aaaai.org 2. Food Allergy Research and Education (FARE): foodallergy.org 3. Mothers of Asthmatics: http://www.asthmacommunitynetwork.org 4. American College of Allergy, Asthma, and Immunology: www.acaai.org   COVID-19 Vaccine Information can be found at: [email or web address] For questions related to vaccine distribution or appointments, please email [email or web address] or call [phone].   We realize that you might be concerned about having an allergic reaction to the COVID19 vaccines. To help with that concern, WE ARE OFFERING THE COVID19 VACCINES IN OUR OFFICE! Ask the front desk for dates!     "Like" [place] on Facebook and Instagram for our latest updates!      A healthy democracy works best when [employer] participate! Make sure you are registered to vote! If you have moved or changed any of your contact information, you will need to get this updated before voting!  In some cases, you MAY be able to register to vote online: [email or web address]       Subjective:   Caitlin Tanner is a 72 y.o. female presenting today for follow up of  Chief Complaint  Patient presents with   Follow-up    Runny nose and stopped up nose.     Caitlin Tanner has a history of the following: Patient Active Problem List   Diagnosis Date Noted   Prolapsed internal hemorrhoids,  grade 3 02/12/2023   Acute cough 08/23/2021   Restrictive lung disease 01/08/2021   Dyspepsia 06/09/2019   Hematochezia 06/09/2019   Moderate persistent asthma, uncomplicated 03/11/2019   Acute non-recurrent maxillary sinusitis 03/11/2019   Chronic rhinitis 03/11/2019   Nausea without vomiting 07/08/2013   Need for  prophylactic vaccination and inoculation against influenza 06/12/2013   Well controlled persistent asthma 06/12/2013   Multiple lung nodules 06/12/2013   Abdominal bloating 11/24/2011   Left lower quadrant pain 10/22/2011   HEMORRHOIDS, INTERNAL 05/29/2009   Gastroesophageal reflux disease 05/29/2009   HEMATOCHEZIA 05/29/2009   ANXIETY NEUROSIS 05/28/2009   IRRITABLE BOWEL SYNDROME 05/28/2009   DIARRHEA 05/28/2009   ALLERGY 05/28/2009    History obtained from: chart review and {Persons; PED relatives w/patient:19415::"patient"}.  Caitlin Tanner is a 72 y.o. female presenting for {Blank single:19197::"a food challenge","a drug challenge","skin testing","a sick visit","an evaluation of ***","a follow up visit"}.  She was last seen in March 2024 by Dr. Allena Katz.  At that time, she increased her Advair to 2 puffs twice daily with albuterol as needed.  She had a mixed restrictive and obstructive pattern.  For her rhinitis, we continue with Dymista as well as loratadine and Mucinex.  For her reflux, we continue with Protonix.  Since last visit, she has done well.   Asthma/Respiratory Symptom History: She does think that she has done well. She feels that things have gotten worse since, but she is not sure. She was a social smoker years ago in her 55s. She is having some coughing which has gotten worse during the warmer weather with the pollen seasons. She will use it when she feels that she is getting tighter. She does have a spacer.  She was on Breztri in the past. She is not sure whether it worked better, but it was more expensive. She thinks that might be why we changed it back to Advair.   She had a chest CT in December 2022. She still sees Dr. Vassie Loll annually. She saw him last in January 2024.   CT Chest IMPRESSION (09/19/21): Previously seen 6 mm left upper lobe pulmonary nodule nearly completely resolved with residual 1-2 mm nodule.   Clustered peribronchovascular tree-in-bud nodular densities in both  lungs, some improved while others are more prominent. This most likely reflects areas of chronic or acute bronchiolitis.   Coronary artery disease.   Aortic Atherosclerosis (ICD10-I70.0).  {Blank single:19197::"Allergic Rhinitis Symptom History: ***"," "}  {Blank single:19197::"Food Allergy Symptom History: ***"," "}  {Blank single:19197::"Skin Symptom History: ***"," "}  {Blank single:19197::"GERD Symptom History: ***"," "}  Otherwise, there have been no changes to her past medical history, surgical history, family history, or social history.    ROS     Objective:   Blood pressure 124/80, pulse 83, temperature 98 F (36.7 C), resp. rate 14, height 5\' 6"  (1.676 m), weight 175 lb 2 oz (79.4 kg), SpO2 94 %. Body mass index is 28.27 kg/m.    Physical Exam   Diagnostic studies:    Spirometry: results abnormal (FEV1: 1.11/50%, FVC: 1.61/56%, FEV1/FVC: 69%).    Spirometry consistent with possible restrictive disease. This is much worse than previous spirometries.     Allergy Studies: {Blank single:19197::"none","labs sent instead"," "}    {Blank single:19197::"Allergy testing results were read and interpreted by myself, documented by clinical staff."," "}      Malachi Bonds, MD  [facility]

## 2023-03-27 NOTE — Patient Instructions (Addendum)
Asthma - Lung testing is around 50% today which is slowly getting worse. - I am going to get some labs to look for weird causes of asthma/breathing problems.  - I am going to talk to Dr. Vassie Loll about what to do with your chest CT.  - We may consider starting an injectable asthma medication. - Daily controller medication(s): Advair two puffs twice daily with spacer - Prior to physical activity: albuterol 2 puffs 10-15 minutes before physical activity. - Rescue medications: albuterol 4 puffs every 4-6 hours as needed - Asthma control goals:  * Full participation in all desired activities (may need albuterol before activity) * Albuterol use two time or less a week on average (not counting use with activity) * Cough interfering with sleep two time or less a month * Oral steroids no more than once a year * No hospitalizations  Chronic rhinitis - Continue saline nasal rinse as needed for nasal symptoms. - Continue Dymista nasal spray-using 2 sprays each nostril twice a day to help with nasal congestion and runny nose. - Continue with loratadine 10mg  once daily AS NEEDED.    Reflux - Continue Protonix once a day to help with reflux. - Continue dietary and lifestyle modifications  Return in about 2 months (around 05/27/2023).    Please inform us of any Emergency Department visits, hospitalizations, or changes in symptoms. Call us before going to the ED for breathing or allergy symptoms since we might be able to fit you in for a sick visit. Feel free to contact us anytime with any questions, problems, or concerns.  It was a pleasure to see you again today!  Websites that have reliable patient information: 1. American Academy of Asthma, Allergy, and Immunology: www.aaaai.org 2. Food Allergy Research and Education (FARE): foodallergy.org 3. Mothers of Asthmatics: http://www.asthmacommunitynetwork.org 4. American College of Allergy, Asthma, and Immunology: www.acaai.org   COVID-19 Vaccine  Information can be found at: [email or web address] For questions related to vaccine distribution or appointments, please email [email or web address] or call [phone].   We realize that you might be concerned about having an allergic reaction to the COVID19 vaccines. To help with that concern, WE ARE OFFERING THE COVID19 VACCINES IN OUR OFFICE! Ask the front desk for dates!     "Like" [place] on Facebook and Instagram for our latest updates!      A healthy democracy works best when [employer] participate! Make sure you are registered to vote! If you have moved or changed any of your contact information, you will need to get this updated before voting!  In some cases, you MAY be able to register to vote online: [email or web address]

## 2023-03-29 ENCOUNTER — Encounter: Payer: Self-pay | Admitting: Allergy & Immunology

## 2023-03-31 ENCOUNTER — Other Ambulatory Visit: Payer: Self-pay | Admitting: Internal Medicine

## 2023-04-06 ENCOUNTER — Telehealth: Payer: Self-pay | Admitting: Pulmonary Disease

## 2023-04-06 DIAGNOSIS — R918 Other nonspecific abnormal finding of lung field: Secondary | ICD-10-CM

## 2023-04-06 DIAGNOSIS — R911 Solitary pulmonary nodule: Secondary | ICD-10-CM

## 2023-04-06 NOTE — Telephone Encounter (Signed)
I discussed with Dr. Dellis Anes Her spirometry shows decreasing flows. We wanted to proceed with CT chest without contrast to follow-up on changes noted on CT scan in 2022 Please arrange office visit after scan Reason -previous abnormal CT scan

## 2023-04-08 NOTE — Telephone Encounter (Signed)
I have ordered chest ct without with note that pt needs to be seen by Dr. Vassie Loll after scan.

## 2023-04-08 NOTE — Telephone Encounter (Signed)
Thanks much!  Syrenity Klepacki, MD [facility]  

## 2023-04-22 ENCOUNTER — Ambulatory Visit (HOSPITAL_COMMUNITY)
Admission: RE | Admit: 2023-04-22 | Discharge: 2023-04-22 | Disposition: A | Discharge status: Home / Self Care | Payer: Medicare Other | Source: Ambulatory Visit | Attending: Pulmonary Disease | Admitting: Pulmonary Disease

## 2023-04-22 DIAGNOSIS — R911 Solitary pulmonary nodule: Secondary | ICD-10-CM | POA: Diagnosis present

## 2023-04-22 DIAGNOSIS — R918 Other nonspecific abnormal finding of lung field: Secondary | ICD-10-CM | POA: Diagnosis not present

## 2023-04-23 ENCOUNTER — Encounter: Payer: Self-pay | Admitting: Gastroenterology

## 2023-04-23 ENCOUNTER — Ambulatory Visit: Payer: Medicare Other | Admitting: Gastroenterology

## 2023-04-23 VITALS — BP 135/70 | HR 72 | Temp 99.1°F | Ht 66.0 in | Wt 176.8 lb

## 2023-04-23 DIAGNOSIS — K642 Third degree hemorrhoids: Secondary | ICD-10-CM

## 2023-04-23 LAB — ALLERGENS W/COMP RFLX AREA 2

## 2023-04-23 LAB — CBC WITH DIFFERENTIAL
Basophils Absolute: 0.1 10*3/uL (ref 0.0–0.2)
Basos: 1 %
EOS (ABSOLUTE): 0.2 10*3/uL (ref 0.0–0.4)
Hematocrit: 39.3 % (ref 34.0–46.6)
Hemoglobin: 13.1 g/dL (ref 11.1–15.9)
Immature Grans (Abs): 0 10*3/uL (ref 0.0–0.1)
Immature Granulocytes: 0 %
Lymphocytes Absolute: 1.5 10*3/uL (ref 0.7–3.1)
Lymphs: 31 %
MCHC: 33.3 g/dL (ref 31.5–35.7)
MCV: 89 fL (ref 79–97)
Monocytes Absolute: 0.6 10*3/uL (ref 0.1–0.9)
Neutrophils Absolute: 2.4 10*3/uL (ref 1.4–7.0)
Neutrophils: 51 %
RBC: 4.4 x10E6/uL (ref 3.77–5.28)
RDW: 12.4 % (ref 11.7–15.4)
WBC: 4.9 10*3/uL (ref 3.4–10.8)

## 2023-04-23 LAB — ASPERGILLUS PRECIPITINS

## 2023-04-23 LAB — ANCA TITERS
Atypical pANCA: <1:20 {titer}
C-ANCA: <1:20 {titer}
P-ANCA: <1:20 {titer}

## 2023-04-23 NOTE — Patient Instructions (Signed)
  Please avoid straining.  You should limit your toilet time to 2-3 minutes at the most.   I recommend Benefiber 2 teaspoons each morning in the beverage of your choice!  Please call me with any concerns or issues!  I will see you in follow-up for additional banding in several weeks.   I enjoyed seeing you again today! I value our relationship and want to provide genuine, compassionate, and quality care. You may receive a survey regarding your visit with me, and I welcome your feedback! Thanks so much for taking the time to complete this. I look forward to seeing you again.      Anna W. Boone, PhD, ANP-BC [facility]       

## 2023-04-23 NOTE — Progress Notes (Signed)
    [facility] BANDING PROCEDURE NOTE  Caitlin Tanner is a 72 y.o. female presenting today for consideration of hemorrhoid banding. Last colonoscopy April 2024 with hemorrhoids and tubular adenoma with surveillance recommended in 7 years. Notes bleeding, itching, prolapsing, burning. She has had left lateral and right posterior banding.    The patient presents with symptomatic grade 3 hemorrhoids, unresponsive to maximal medical therapy, requesting rubber band ligation of her hemorrhoidal disease. All risks, benefits, and alternative forms of therapy were described and informed consent was obtained.  The decision was made to band neutrally, and the [facility] System was used to perform band ligation without complication. Appears to be left lateral. Digital anorectal examination was then performed to assure proper positioning of the band, and to adjust the banded tissue as required. The patient was discharged home without pain or other issues. Dietary and behavioral recommendations were given, along with follow-up instructions. The patient will return in several weeks for followup and possible additional banding as required. Will do anoscopy at next visit.   No complications were encountered and the patient tolerated the procedure well.   Gelene Mink, PhD, ANP-BC [facility]

## 2023-04-30 ENCOUNTER — Other Ambulatory Visit (HOSPITAL_COMMUNITY): Payer: Self-pay

## 2023-04-30 DIAGNOSIS — R918 Other nonspecific abnormal finding of lung field: Secondary | ICD-10-CM

## 2023-05-12 ENCOUNTER — Encounter: Payer: Self-pay | Admitting: Gastroenterology

## 2023-05-12 ENCOUNTER — Ambulatory Visit (INDEPENDENT_AMBULATORY_CARE_PROVIDER_SITE_OTHER): Payer: Medicare Other | Admitting: Gastroenterology

## 2023-05-12 VITALS — BP 127/71 | HR 87 | Temp 98.0°F | Ht 66.0 in | Wt 175.1 lb

## 2023-05-12 DIAGNOSIS — K642 Third degree hemorrhoids: Secondary | ICD-10-CM

## 2023-05-12 DIAGNOSIS — B372 Candidiasis of skin and nail: Secondary | ICD-10-CM

## 2023-05-12 MED ORDER — KETOCONAZOLE 2 % EX CREA: 1.0000 | TOPICAL_CREAM | Freq: Two times a day (BID) | CUTANEOUS | 0 refills | Status: DC | PRN

## 2023-05-12 NOTE — Patient Instructions (Signed)
I have sent in ketoconazole to use between buttocks twice a day. You can use the hydrocortisone for hemorrhoids.  Let me know if no improvement, and we will change the medication!  Make sure to keep the area dry as you can.  I will see you in a few weeks!  I enjoyed seeing you again today! I value our relationship and want to provide genuine, compassionate, and quality care. You may receive a survey regarding your visit with me, and I welcome your feedback! Thanks so much for taking the time to complete this. I look forward to seeing you again.      Gelene Mink, PhD, ANP-BC [facility]

## 2023-05-12 NOTE — Progress Notes (Signed)
Gastroenterology Office Note     Primary Care Physician:  Garald Braver  Primary Gastroenterologist: Dr. Jena Gauss    Chief Complaint   Chief Complaint  Patient presents with   Hemorrhoids    Hemorrhoid banding. Had diarrhea for a couple days and has a rash.      History of Present Illness   Caitlin Tanner is a 72 y.o. female presenting today with a history of chronic GERD, symptomatic hemorrhoids, banding several times this year. She presents for potential banding today.    However, she would like to hold off on this currently. She has had looser stools the past few days and a flare of hemorrhoids. We had discussed she may need more than 3 bandings in the future. She notes itching and burning between buttocks. Hydrocortisone cream has helped slightly.          Past Medical History:  Diagnosis Date   Anxiety disorder    Asthma    Borderline diabetes    Diverticulosis    GERD (gastroesophageal reflux disease)    Hemorrhoids    Hypertension    IBS (irritable bowel syndrome)    S/P colonoscopy 12/23/2010   Dr Rourk-diverticulosis, anal canal hemorhhoids, anal papilla, random bx benign   S/P endoscopy 12/23/2010   Dr Mancel Parsons, tubular esophagus, sm HH, minimal retained food, bile-stained mucus, otherwise normal,, esophageal bx  benign    Past Surgical History:  Procedure Laterality Date   CHOLECYSTECTOMY     cold knife conization     COLONOSCOPY  06/08/2006   internal hemorrhoids otherwise noraml colon and rectum   COLONOSCOPY  11/2010   Dr. Jena Gauss: Anal canal hemorrhoids, diverticulosis, random colon biopsies unremarkable.   COLONOSCOPY WITH PROPOFOL N/A 12/15/2019   Procedure: COLONOSCOPY WITH PROPOFOL;  Surgeon: Corbin Ade, MD;  [facility];  Service: Endoscopy;  Laterality: N/A;  9:00am   COLONOSCOPY WITH PROPOFOL N/A 01/01/2023   Procedure: COLONOSCOPY WITH PROPOFOL;  Surgeon: Corbin Ade, MD;  Location: [facility];  Service:  Endoscopy;  Laterality: N/A;  12:45 am   egd/tcs  11/2010   see PMH   ESOPHAGOGASTRODUODENOSCOPY  06/08/2006   single distal erosion otherwise normal   ESOPHAGOGASTRODUODENOSCOPY  11/2010   Dr. Jena Gauss: Longitudinal for reading of the tubular esophagus, no evidence of eosinophilic esophagitis on biopsy, small hiatal hernia, minimal amount of retained food, bile-stained mucosa.   ESOPHAGOGASTRODUODENOSCOPY (EGD) WITH PROPOFOL N/A 12/15/2019   Procedure: ESOPHAGOGASTRODUODENOSCOPY (EGD) WITH PROPOFOL;  Surgeon: Corbin Ade, MD;  [facility]: [facility];  Service: Endoscopy;  Laterality: N/A;   left great toe surgery     POLYPECTOMY  12/15/2019   Procedure: POLYPECTOMY;  Surgeon: Corbin Ade, MD;  [facility]: [facility];  Service: Endoscopy;;  colon   POLYPECTOMY  01/01/2023   Procedure: POLYPECTOMY;  Surgeon: Corbin Ade, MD;  [facility]: [facility];  Service: Endoscopy;;   TONSILLECTOMY     WRIST FRACTURE SURGERY  07/2013   right    Current Outpatient Medications  Medication Sig Dispense Refill   albuterol (VENTOLIN HFA) 108 (90 Base) MCG/ACT inhaler Inhale 2 puffs into the lungs every 4 (four) hours as needed for wheezing or shortness of breath. 18 each 1   aspirin 81 MG EC tablet Take 81 mg by mouth daily.     Azelastine-Fluticasone (DYMISTA) 137-50 MCG/ACT SUSP Place 2 sprays into the nose 2 (two) times daily. 69 g 1   bismuth subsalicylate (PEPTO BISMOL) 262  MG/15ML suspension Take 30 mLs by mouth daily as needed for indigestion or diarrhea or loose stools.      Cholecalciferol (VITAMIN D3) 25 MCG (1000 UT) CAPS Take 2,000 Units by mouth daily.      escitalopram (LEXAPRO) 10 MG tablet Take 10 mg by mouth daily.      ezetimibe (ZETIA) 10 MG tablet Take 10 mg by mouth daily.     fluticasone-salmeterol (ADVAIR HFA) 230-21 MCG/ACT inhaler Inhale 2 puffs into the lungs 2 (two) times daily. 36 g 1   GARLIC PO Take 1 capsule by mouth daily.     ketoconazole (NIZORAL) 2 % cream Apply 1  Application topically 2 (two) times daily as needed for irritation. Between buttocks. 30 g 0   loratadine (CLARITIN) 10 MG tablet Take 1 tablet (10 mg total) by mouth daily as needed for allergies (Can take an extra dose during flare ups.). 180 tablet 1   LORazepam (ATIVAN) 0.5 MG tablet Take 0.5 mg by mouth daily as needed for anxiety.      losartan-hydrochlorothiazide (HYZAAR) 50-12.5 MG per tablet Take 1 tablet by mouth daily.     meloxicam (MOBIC) 7.5 MG tablet Take 7.5 mg by mouth 2 (two) times daily.     metFORMIN (GLUCOPHAGE) 500 MG tablet Take 500 mg by mouth See admin instructions. 500 mg in the morning if blood sugar is above 140, 500 mg every evening     metoprolol (TOPROL-XL) 50 MG 24 hr tablet Take 50 mg by mouth daily.     Multiple Vitamin (MULTIVITAMIN) capsule Take 1 capsule by mouth daily.     Multiple Vitamins-Minerals (PRESERVISION AREDS 2) CAPS Take 1 capsule by mouth in the morning and at bedtime.     Omega-3 Fatty Acids (FISH OIL) 1000 MG CAPS Take 1,000 mg by mouth daily.     pantoprazole (PROTONIX) 40 MG tablet Take 1 tablet (40 mg total) by mouth daily. 90 tablet 1   Spacer/Aero-Holding Chambers DEVI 1 Device by Does not apply route as directed. 1 each 1   tretinoin (RETIN-A) 0.05 % cream Apply 1 application  topically at bedtime.     Cranberry-Vitamin C-Probiotic (AZO CRANBERRY PO) Take 1 tablet by mouth in the morning and at bedtime.     No current facility-administered medications for this visit.    Allergies as of 05/12/2023 - Review Complete 05/12/2023  Allergen Reaction Noted   Conj estrog-medroxyprogest ace     Ketorolac tromethamine     Sulfa antibiotics Nausea And Vomiting 09/22/2019   Tramadol hcl  04/23/2023    Family History  Problem Relation Age of Onset   Stomach cancer Father 68   Throat cancer Mother 42   Diabetes Brother    Transient ischemic attack Brother    Colon cancer Neg Hx    Allergic rhinitis Neg Hx    Angioedema Neg Hx    Asthma  Neg Hx    Atopy Neg Hx    Eczema Neg Hx    Immunodeficiency Neg Hx    Urticaria Neg Hx     Social History   Socioeconomic History   Marital status: Married    Spouse name: Not on file   Number of children: 1   Years of education: Not on file   Highest education level: Not on file  Occupational History    Employer: UNEMPLOYED  Tobacco Use   Smoking status: Former    Current packs/day: 0.00    Average packs/day: 0.1 packs/day for 3.0 years (  0.3 ttl pk-yrs)    Types: Cigarettes    Start date: 09/29/1984    Quit date: 09/30/1987    Years since quitting: 35.6   Smokeless tobacco: Never   Tobacco comments:    pt states she smoked socially  Vaping Use   Vaping status: Never Used  Substance and Sexual Activity   Alcohol use: No   Drug use: No   Sexual activity: Not Currently  Other Topics Concern   Not on file  Social History Narrative   Not on file   Social Determinants of Health   Financial Resource Strain: Not on file  Food Insecurity: Not on file  Transportation Needs: Not on file  Physical Activity: Not on file  Stress: Not on file  Social Connections: Not on file  Intimate Partner Violence: Not on file     Review of Systems   Gen: Denies any fever, chills, fatigue, weight loss, lack of appetite.  CV: Denies chest pain, heart palpitations, peripheral edema, syncope.  Resp: Denies shortness of breath at rest or with exertion. Denies wheezing or cough.  GI: Denies dysphagia or odynophagia. Denies jaundice, hematemesis, fecal incontinence. GU : Denies urinary burning, urinary frequency, urinary hesitancy MS: Denies joint pain, muscle weakness, cramps, or limitation of movement.  Derm: Denies rash, itching, dry skin Psych: Denies depression, anxiety, memory loss, and confusion Heme: Denies bruising, bleeding, and enlarged lymph nodes.   Physical Exam   BP 127/71 (BP Location: Left Arm, Patient Position: Sitting, Cuff Size: Normal)   Pulse 87   Temp 98 F (36.7  C) (Temporal)   Ht 5\' 6"  (1.676 m)   Wt 175 lb 1 oz (79.4 kg)   SpO2 97%   BMI 28.26 kg/m  General:   Alert and oriented. Pleasant and cooperative. Well-nourished and well-developed.  Head:  Normocephalic and atraumatic. Eyes:  Without icterus Rectal: small prolapsing Grade 3 hemorrhoid. Yeast between buttocks Msk:  Symmetrical without gross deformities. Normal posture. Extremities:  Without edema. Neurologic:  Alert and  oriented x4;  grossly normal neurologically. Skin:  Intact without significant lesions or rashes. Psych:  Alert and cooperative. Normal mood and affect.   Assessment   Caitlin Tanner is a 72 y.o. female presenting today with a history of symptomatic hemorrhoids s/p banding previously and would benefit from additional banding ;however, she now has yeast-like rash between buttocks likely brought on by moisture and hemorrhoids.  Will trial ketoconazole. If no improvement, can trial clobetasol.    PLAN   Ketoconazole BID Return in 2-3 weeks for banding Message if no improvement    Gelene Mink, PhD, [facility]

## 2023-05-14 ENCOUNTER — Encounter: Payer: Medicare Other | Admitting: Gastroenterology

## 2023-05-29 ENCOUNTER — Encounter: Payer: Self-pay | Admitting: Allergy & Immunology

## 2023-05-29 ENCOUNTER — Ambulatory Visit: Payer: Medicare Other | Admitting: Allergy & Immunology

## 2023-05-29 ENCOUNTER — Other Ambulatory Visit: Payer: Self-pay

## 2023-05-29 DIAGNOSIS — K219 Gastro-esophageal reflux disease without esophagitis: Secondary | ICD-10-CM

## 2023-05-29 DIAGNOSIS — R918 Other nonspecific abnormal finding of lung field: Secondary | ICD-10-CM

## 2023-05-29 DIAGNOSIS — J4540 Moderate persistent asthma, uncomplicated: Secondary | ICD-10-CM

## 2023-05-29 DIAGNOSIS — J310 Chronic rhinitis: Secondary | ICD-10-CM | POA: Diagnosis not present

## 2023-05-29 MED ORDER — FAMOTIDINE 40 MG PO TABS: 40.0000 mg | ORAL_TABLET | Freq: Every day | ORAL | 1 refills | Status: DC

## 2023-05-29 NOTE — Progress Notes (Signed)
FOLLOW UP  Date of Service/Encounter:  05/29/23   Assessment:   Moderate persistent asthma, uncomplicated with spirometry that has improved   Most recent chest CT with tree-in-bud nodular densities and new nodule - needs repeat chest CT in Jan 2025   Non-allergic rhinitis   Gastroesophageal reflux disease - on PPI   Left upper lobe lung nodule - needs repeat CT scan in April 2023 (follows with Dr. Vassie Loll)  Plan/Recommendations:   Asthma - Lung testing is around 71% which is much better. - This is much better than last time we saw you.  - Your chest CT showed an area that Dr. Vassie Loll wants to follow.  - I would definitely go back and see him to discuss the chest CT results. - I would call the office to make that appointment.  - Daily controller medication(s): Advair 230/21 mcg two puffs twice daily with spacer - Prior to physical activity: albuterol 2 puffs 10-15 minutes before physical activity. - Rescue medications: albuterol 4 puffs every 4-6 hours as needed - Asthma control goals:  * Full participation in all desired activities (may need albuterol before activity) * Albuterol use two time or less a week on average (not counting use with activity) * Cough interfering with sleep two time or less a month * Oral steroids no more than once a year * No hospitalizations  Chronic rhinitis - Continue saline nasal rinse as needed for nasal symptoms. - Continue Dymista nasal spray-using 2 sprays each nostril twice a day to help with nasal congestion and runny nose. - Continue with cetirizine 10mg  once daily. - You can always alternate the antihistamines to help with symptoms.   Reflux - Continue Protonix once a day to help with reflux. - Start Pepcid 40mg  at night.  - Continue dietary and lifestyle modifications.  Return in about 2 months (around 07/29/2023).    Subjective:   Caitlin Tanner is a 72 y.o. female presenting today for follow up of  Chief Complaint  Patient  presents with   Follow-up    Caitlin Tanner has a history of the following: Patient Active Problem List   Diagnosis Date Noted   Yeast dermatitis 05/12/2023   Prolapsed internal hemorrhoids, grade 3 02/12/2023   Acute cough 08/23/2021   Restrictive lung disease 01/08/2021   Dyspepsia 06/09/2019   Hematochezia 06/09/2019   Moderate persistent asthma, uncomplicated 03/11/2019   Acute non-recurrent maxillary sinusitis 03/11/2019   Chronic rhinitis 03/11/2019   Nausea without vomiting 07/08/2013   Need for prophylactic vaccination and inoculation against influenza 06/12/2013   Well controlled persistent asthma 06/12/2013   Multiple lung nodules 06/12/2013   Abdominal bloating 11/24/2011   Left lower quadrant pain 10/22/2011   HEMORRHOIDS, INTERNAL 05/29/2009   Gastroesophageal reflux disease 05/29/2009   HEMATOCHEZIA 05/29/2009   ANXIETY NEUROSIS 05/28/2009   IRRITABLE BOWEL SYNDROME 05/28/2009   DIARRHEA 05/28/2009   ALLERGY 05/28/2009    History obtained from: chart review and patient.  [name] is a 72 y.o. female presenting for a follow up visit.  She was last seen in June 2024.  At that time, her lung testing was around 50% and had been decreasing over time.  We sent some labs to look for weird causes of asthma.  We talked to Dr. Vassie Loll about what to do with her chest CT.  We talked about an injectable asthma medication as well.  We continue with Advair 2 puffs twice daily and albuterol as needed.  For her rhinitis, we  continue with Dymista as well as loratadine.  Reflux was under good control with Protonix and dietary changes.  Dr. Elbert Ewings recommended doing a chest CT without contrast to follow-up on changes from her CT scan in 2022.  This was done at the end of July.  It showed a new solid nodule within the medial right apex and multiple small tiny lung nodules.  These tiny nodules were felt to be benign.  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  A repeat chest CT was recommended in 3 to 6 months. She is supposed to have another appointment with Dr. Vassie Loll.    Since last visit,  Asthma/Respiratory Symptom History: She remains on the Advair one puff twice daily . This is the 231/21 mcg dose. We gave her the Markus Daft but she is  not sure whether it worked any better. She used her emergency inhaler two weeks ago, but she was active in cleaning which is why she needed to use it. She does do some walking without a problem. She will walk around her neighborhood. But now there are too many dogs in the area.   Allergic Rhinitis Symptom History: She does have a lot of congestion and sneezing. She is using the generic brand cetrizine.  She has done very well with this regimen. Her latest environmental testing was negative in July 2024 (blood work).   GERD Symptom History: She had some  heart burn. She is currently taking Protonix daily. She denies missed doses at all. She thinks that she ate something twice last week. She does take Tums as needed. She will take some Pepto Bismol sometimes.  Otherwise, there have been no changes to her past medical history, surgical history, family history, or social history.    Review of systems otherwise negative other than that mentioned in the HPI.    Objective:   There were no vitals taken for this visit. There is no height or weight on file to calculate BMI.    Physical Exam Vitals reviewed.  Constitutional:      Appearance: She is well-developed.     Comments: Some hoarseness today. Seems a bit worse than previous exams.   HENT:     Head: Normocephalic and atraumatic.     Right Ear: Tympanic membrane, ear canal and external ear normal.     Left Ear: Tympanic membrane, ear canal and external ear normal.     Nose: No nasal deformity, septal deviation, mucosal edema or rhinorrhea.     Right Turbinates: Enlarged, swollen and pale.     Left  Turbinates: Enlarged, swollen and pale.     Right Sinus: No maxillary sinus tenderness or frontal sinus tenderness.     Left Sinus: No maxillary sinus tenderness or frontal sinus tenderness.     Mouth/Throat:     Mouth: Mucous membranes are not pale and not dry.     Pharynx: Uvula midline.  Eyes:     General: Lids are normal. No allergic shiner.       Right eye: No discharge.        Left eye: No discharge.     Conjunctiva/sclera: Conjunctivae normal.     Right eye: Right conjunctiva is not injected. No chemosis.    Left eye: Left conjunctiva is not injected. No chemosis.    Pupils: Pupils are equal, round, and reactive to light.  Cardiovascular:     Rate and Rhythm: Normal rate and  regular rhythm.     Heart sounds: Normal heart sounds.  Pulmonary:     Effort: Pulmonary effort is normal. No tachypnea, accessory muscle usage or respiratory distress.     Breath sounds: Normal breath sounds. No wheezing, rhonchi or rales.     Comments: Moving air well in all lung fields.  No increased work of breathing. Chest:     Chest wall: No tenderness.  Lymphadenopathy:     Cervical: No cervical adenopathy.  Skin:    Coloration: Skin is not pale.     Findings: No abrasion, erythema, petechiae or rash. Rash is not papular, urticarial or vesicular.  Neurological:     Mental Status: She is alert.  Psychiatric:        Behavior: Behavior is cooperative.      Diagnostic studies:    Spirometry: results abnormal (FEV1: 1.58/71%, FVC: 2.34/81%, FEV1/FVC: 68%).    Spirometry consistent with possible restrictive disease.   Allergy Studies: none       Malachi Bonds, MD  [facility]

## 2023-05-29 NOTE — Patient Instructions (Addendum)
Asthma - Lung testing is around 71% which is much better. - This is much better than last time we saw you.  - Your chest CT showed an area that Dr. Vassie Loll wants to follow.  - I would definitely go back and see him to discuss the chest CT results. - I would call the office to make that appointment.  - Daily controller medication(s): Advair 230/21 mcg two puffs twice daily with spacer - Prior to physical activity: albuterol 2 puffs 10-15 minutes before physical activity. - Rescue medications: albuterol 4 puffs every 4-6 hours as needed - Asthma control goals:  * Full participation in all desired activities (may need albuterol before activity) * Albuterol use two time or less a week on average (not counting use with activity) * Cough interfering with sleep two time or less a month * Oral steroids no more than once a year * No hospitalizations  Chronic rhinitis - Continue saline nasal rinse as needed for nasal symptoms. - Continue Dymista nasal spray-using 2 sprays each nostril twice a day to help with nasal congestion and runny nose. - Continue with cetirizine 10mg  once daily. - You can always alternate the antihistamines to help with symptoms.   Reflux - Continue Protonix once a day to help with reflux. - Start Pepcid 40mg  at night.  - Continue dietary and lifestyle modifications.  Return in about 2 months (around 07/29/2023).    Please inform us of any Emergency Department visits, hospitalizations, or changes in symptoms. Call us before going to the ED for breathing or allergy symptoms since we might be able to fit you in for a sick visit. Feel free to contact us anytime with any questions, problems, or concerns.  It was a pleasure to see you again today!  Websites that have reliable patient information: 1. American Academy of Asthma, Allergy, and Immunology: www.aaaai.org 2. Food Allergy Research and Education (FARE): foodallergy.org 3. Mothers of Asthmatics:  http://www.asthmacommunitynetwork.org 4. American College of Allergy, Asthma, and Immunology: www.acaai.org   COVID-19 Vaccine Information can be found at: [email or web address] For questions related to vaccine distribution or appointments, please email [email or web address] or call [phone].   We realize that you might be concerned about having an allergic reaction to the COVID19 vaccines. To help with that concern, WE ARE OFFERING THE COVID19 VACCINES IN OUR OFFICE! Ask the front desk for dates!     "Like" [place] on Facebook and Instagram for our latest updates!      A healthy democracy works best when [employer] participate! Make sure you are registered to vote! If you have moved or changed any of your contact information, you will need to get this updated before voting!  In some cases, you MAY be able to register to vote online: [email or web address]

## 2023-06-04 ENCOUNTER — Ambulatory Visit (INDEPENDENT_AMBULATORY_CARE_PROVIDER_SITE_OTHER): Payer: Medicare Other | Admitting: Gastroenterology

## 2023-06-04 ENCOUNTER — Encounter: Payer: Self-pay | Admitting: Gastroenterology

## 2023-06-04 VITALS — BP 138/74 | HR 73 | Temp 98.1°F | Ht 66.0 in | Wt 177.8 lb

## 2023-06-04 DIAGNOSIS — K642 Third degree hemorrhoids: Secondary | ICD-10-CM

## 2023-06-04 NOTE — Patient Instructions (Signed)
  Please avoid straining.  You should limit your toilet time to 2-3 minutes at the most.   I recommend Benefiber 2 teaspoons each morning in the beverage of your choice!  Please call me with any concerns or issues!  I will see you back as needed!  I enjoyed seeing you again today! I value our relationship and want to provide genuine, compassionate, and quality care. You may receive a survey regarding your visit with me, and I welcome your feedback! Thanks so much for taking the time to complete this. I look forward to seeing you again.      Gelene Mink, PhD, ANP-BC [facility]

## 2023-06-04 NOTE — Progress Notes (Signed)
She has had left lateral, right posterior, and neutrally (which appeared to be in left lateral position).    Right posterior      [facility] BANDING PROCEDURE NOTE  Caitlin Tanner is a 72 y.o. female presenting today for consideration of hemorrhoid banding. Last colonoscopy  April 2024 with hemorrhoids and tubular adenoma with surveillance recommended in 7 years. She has had left lateral, right posterior, left lateral. Has persistent prolapsing.    The patient presents with symptomatic grade 2 hemorrhoids, unresponsive to maximal medical therapy, requesting rubber band ligation of her hemorrhoidal disease. All risks, benefits, and alternative forms of therapy were described and informed consent was obtained.  In the left lateral decubitus position, anoscopic examination revealed grade 2 hemorrhoids in the right posterior position (s).  The decision was made to band the right posterior internal hemorrhoid, and the [facility] O'Regan System was used to perform band ligation without complication. Digital anorectal examination was then performed to assure proper positioning of the band, and to adjust the banded tissue as required. The patient was discharged home without pain or other issues. Dietary and behavioral recommendations were given, along with follow-up instructions. The patient will return as needed.   No complications were encountered and the patient tolerated the procedure well.   Gelene Mink, PhD, ANP-BC [facility]

## 2023-07-29 ENCOUNTER — Ambulatory Visit: Payer: Medicare Other | Admitting: Allergy & Immunology

## 2023-07-29 VITALS — BP 150/84 | HR 81 | Temp 97.9°F | Resp 16 | Wt 177.4 lb

## 2023-07-29 DIAGNOSIS — J4540 Moderate persistent asthma, uncomplicated: Secondary | ICD-10-CM | POA: Diagnosis not present

## 2023-07-29 DIAGNOSIS — J310 Chronic rhinitis: Secondary | ICD-10-CM

## 2023-07-29 DIAGNOSIS — K219 Gastro-esophageal reflux disease without esophagitis: Secondary | ICD-10-CM | POA: Diagnosis not present

## 2023-07-29 NOTE — Patient Instructions (Addendum)
Asthma - Lung testing is around 62% which is a better value compared to last time.  - You have definitely been worse in the past. - We are not going to make any changes at this time.  - Daily controller medication(s): Advair 230/21 mcg two puffs twice daily with spacer - Prior to physical activity: albuterol 2 puffs 10-15 minutes before physical activity. - Rescue medications: albuterol 4 puffs every 4-6 hours as needed - Asthma control goals:  * Full participation in all desired activities (may need albuterol before activity) * Albuterol use two time or less a week on average (not counting use with activity) * Cough interfering with sleep two time or less a month * Oral steroids no more than once a year * No hospitalizations  Chronic rhinitis - Continue saline nasal rinse as needed for nasal symptoms. - Continue Dymista nasal spray-using 2 sprays each nostril twice a day to help with nasal congestion and runny nose. - Continue with cetirizine 10mg  once daily. - You can always alternate the antihistamines to help with symptoms.   Reflux - Continue Protonix once a day to help with reflux. - Continue with Pepcid 40mg  at night.  - Continue dietary and lifestyle modifications.  Return in about 6 months (around 01/27/2024). You can have the follow up appointment with Dr. Dellis Anes or a Nurse Practicioner (our Nurse Practitioners are excellent and always have Physician oversight!).    Please inform us of any Emergency Department visits, hospitalizations, or changes in symptoms. Call us before going to the ED for breathing or allergy symptoms since we might be able to fit you in for a sick visit. Feel free to contact us anytime with any questions, problems, or concerns.  It was a pleasure to see you again today!  Websites that have reliable patient information: 1. American Academy of Asthma, Allergy, and Immunology: www.aaaai.org 2. Food Allergy Research and Education (FARE):  foodallergy.org 3. Mothers of Asthmatics: http://www.asthmacommunitynetwork.org 4. American College of Allergy, Asthma, and Immunology: www.acaai.org      "Like" [place] on Facebook and Instagram for our latest updates!      A healthy democracy works best when [employer] participate! Make sure you are registered to vote! If you have moved or changed any of your contact information, you will need to get this updated before voting! Scan the QR codes below to learn more!

## 2023-07-29 NOTE — Progress Notes (Signed)
FOLLOW UP  Date of Service/Encounter:  07/29/23   Assessment:   Moderate persistent asthma, uncomplicated with spirometry that has improved   Most recent chest CT with tree-in-bud nodular densities and new nodule - needs repeat chest CT in Jan 2025   Non-allergic rhinitis   Gastroesophageal reflux disease - on PPI + H2 blocker   Hoarseness   Plan/Recommendations:   Asthma - Lung testing is around 62% which is a better value compared to last time.  - You have definitely been worse in the past. - We are not going to make any changes at this time.  - Daily controller medication(s): Advair 230/21 mcg two puffs twice daily with spacer - Prior to physical activity: albuterol 2 puffs 10-15 minutes before physical activity. - Rescue medications: albuterol 4 puffs every 4-6 hours as needed - Asthma control goals:  * Full participation in all desired activities (may need albuterol before activity) * Albuterol use two time or less a week on average (not counting use with activity) * Cough interfering with sleep two time or less a month * Oral steroids no more than once a year * No hospitalizations  Chronic rhinitis - Continue saline nasal rinse as needed for nasal symptoms. - Continue Dymista nasal spray-using 2 sprays each nostril twice a day to help with nasal congestion and runny nose. - Continue with cetirizine 10mg  once daily. - You can always alternate the antihistamines to help with symptoms.   Reflux - Continue Protonix once a day to help with reflux. - Continue with Pepcid 40mg  at night.  - Continue dietary and lifestyle modifications.  Return in about 6 months (around 01/27/2024). You can have the follow up appointment with Dr. Dellis Anes or a Nurse Practicioner (our Nurse Practitioners are excellent and always have Physician oversight!).   Subjective:   Caitlin Tanner is a 72 y.o. female presenting today for follow up of  Chief Complaint  Patient presents with    Follow-up    Will have a flare up once in a while     Caitlin Tanner has a history of the following: Patient Active Problem List   Diagnosis Date Noted   Yeast dermatitis 05/12/2023   Prolapsed internal hemorrhoids, grade 3 02/12/2023   Acute cough 08/23/2021   Restrictive lung disease 01/08/2021   Dyspepsia 06/09/2019   Hematochezia 06/09/2019   Moderate persistent asthma, uncomplicated 03/11/2019   Acute non-recurrent maxillary sinusitis 03/11/2019   Chronic rhinitis 03/11/2019   Nausea without vomiting 07/08/2013   Need for prophylactic vaccination and inoculation against influenza 06/12/2013   Well controlled persistent asthma 06/12/2013   Multiple lung nodules 06/12/2013   Abdominal bloating 11/24/2011   Left lower quadrant pain 10/22/2011   Internal hemorrhoids 05/29/2009   Gastroesophageal reflux disease 05/29/2009   HEMATOCHEZIA 05/29/2009   Anxiety state 05/28/2009   IRRITABLE BOWEL SYNDROME 05/28/2009   DIARRHEA 05/28/2009   Allergy 05/28/2009    History obtained from: chart review and patient.  Discussed the use of AI scribe software for clinical note transcription with the patient and/or guardian, who gave verbal consent to proceed.  Caitlin Tanner is a 72 y.o. female presenting for a follow up visit.  She was last seen in August 2024.  At that time, lung testing was around 71% which was much better.  Her chest CT showed an area that was followed by her pulmonologist.  We continue with Advair 230 mcg 2 puffs twice daily as well as albuterol as needed.  For her  rhinitis, we continue with Dymista and cetirizine.  Reflux was not under good control.  We continue Protonix and started Pepcid.  Since last visit, she has done well.   Asthma/Respiratory Symptom History: Caitlin Tanner presents with improved breathing. She reports occasional dryness in her breathing but denies any reflux or heartburn. Her pulmonary function test results show a slight decrease, but she has experienced worse in  the past. She continues to use Advair, two puffs twice a day, and reports no issues with affordability. She has been seen by a pulmonologist earlier this year and is due for a chest CT in the future to monitor her pulmonary nodules. She denies any hemoptysis or significant mucus production. She reports one to two bad mornings a week but has not needed to use her rescue inhaler since the last visit.  Allergic Rhinitis Symptom History: Caitlin Tanner also has occasional nasal congestion, which she manages with a nasal spray. She has been using the spray up to twice a day as needed. She also reports propping up multiple pillows to sleep at night.  The patient is also involved in craft work, which she enjoys. She denies any new injuries or issues related to her craft work. She had a recent dermatology visit where a lesion was removed from her ear. She denies any significant bleeding or discomfort from the procedure.  The patient's overall condition appears stable with her current management plan. She expresses a desire to improve her pulmonary function test results but is otherwise content with her health status.   Otherwise, there have been no changes to her past medical history, surgical history, family history, or social history.    Review of systems otherwise negative other than that mentioned in the HPI.    Objective:   Blood pressure (!) 150/84, pulse 81, temperature 97.9 F (36.6 C), resp. rate 16, weight 177 lb 6 oz (80.5 kg), SpO2 95%. Body mass index is 28.63 kg/m.    Physical Exam Vitals reviewed.  Constitutional:      Appearance: Normal appearance. She is well-developed.     Comments: Some hoarseness today. Seems stable.   HENT:     Head: Normocephalic and atraumatic.     Right Ear: Tympanic membrane, ear canal and external ear normal.     Left Ear: Tympanic membrane, ear canal and external ear normal.     Nose: No nasal deformity, septal deviation, mucosal edema or rhinorrhea.      Right Turbinates: Enlarged, swollen and pale.     Left Turbinates: Enlarged, swollen and pale.     Right Sinus: No maxillary sinus tenderness or frontal sinus tenderness.     Left Sinus: No maxillary sinus tenderness or frontal sinus tenderness.     Mouth/Throat:     Lips: Pink.     Mouth: Mucous membranes are moist. Mucous membranes are not pale and not dry.     Pharynx: Uvula midline.     Comments: Cobblestoning in the posterior oropharynx.  Eyes:     General: Lids are normal. No allergic shiner.       Right eye: No discharge.        Left eye: No discharge.     Conjunctiva/sclera: Conjunctivae normal.     Right eye: Right conjunctiva is not injected. No chemosis.    Left eye: Left conjunctiva is not injected. No chemosis.    Pupils: Pupils are equal, round, and reactive to light.  Cardiovascular:     Rate and Rhythm: Normal rate and regular  rhythm.     Heart sounds: Normal heart sounds.  Pulmonary:     Effort: Pulmonary effort is normal. No tachypnea, accessory muscle usage or respiratory distress.     Breath sounds: Normal breath sounds. No wheezing, rhonchi or rales.     Comments: Moving air well in all lung fields.  No increased work of breathing. Chest:     Chest wall: No tenderness.  Lymphadenopathy:     Cervical: No cervical adenopathy.  Skin:    Coloration: Skin is not pale.     Findings: No abrasion, erythema, petechiae or rash. Rash is not papular, urticarial or vesicular.  Neurological:     Mental Status: She is alert.  Psychiatric:        Behavior: Behavior is cooperative.      Diagnostic studies:    Spirometry: results abnormal (FEV1: 1.36/62%, FVC: 2.11/73%, FEV1/FVC: 64%).    Spirometry consistent with normal pattern.    Allergy Studies: none        Malachi Bonds, MD  [facility]

## 2023-07-31 ENCOUNTER — Encounter: Payer: Self-pay | Admitting: Allergy & Immunology

## 2023-07-31 MED ORDER — FLUTICASONE-SALMETEROL 230-21 MCG/ACT IN AERO: 2.0000 | INHALATION_SPRAY | Freq: Two times a day (BID) | RESPIRATORY_TRACT | 1 refills | Status: DC

## 2023-07-31 MED ORDER — ALBUTEROL SULFATE HFA 108 (90 BASE) MCG/ACT IN AERS: 2.0000 | INHALATION_SPRAY | RESPIRATORY_TRACT | 1 refills | Status: AC | PRN

## 2023-07-31 MED ORDER — AZELASTINE-FLUTICASONE 137-50 MCG/ACT NA SUSP: 2.0000 | Freq: Two times a day (BID) | NASAL | 1 refills | Status: DC

## 2023-07-31 MED ORDER — PANTOPRAZOLE SODIUM 40 MG PO TBEC: 40.0000 mg | DELAYED_RELEASE_TABLET | Freq: Every day | ORAL | 1 refills | Status: DC

## 2023-07-31 MED ORDER — FAMOTIDINE 40 MG PO TABS: 40.0000 mg | ORAL_TABLET | Freq: Every day | ORAL | 1 refills | Status: AC

## 2023-09-17 ENCOUNTER — Other Ambulatory Visit: Payer: Self-pay | Admitting: Allergy & Immunology

## 2023-09-17 ENCOUNTER — Telehealth: Payer: Self-pay | Admitting: Allergy & Immunology

## 2023-09-17 MED ORDER — FLUTICASONE-SALMETEROL 230-21 MCG/ACT IN AERO: 2.0000 | INHALATION_SPRAY | Freq: Two times a day (BID) | RESPIRATORY_TRACT | 0 refills | Status: DC

## 2023-09-17 NOTE — Telephone Encounter (Signed)
Patient Requesting a refill on medication fluticasone-salmeterol (ADVAIR HFA) 230-21 MCG/ACT inhaler [[id number]] please send to [facility] [address] [place]

## 2023-09-17 NOTE — Telephone Encounter (Signed)
Advair inhaler has been sent in to [facility] [place] [facility] [address]. I called patient and left a message to call the office back to inform.

## 2023-09-17 NOTE — Telephone Encounter (Signed)
Spoke to [place] and informed her the inhaler was sent to the pharmacy in [place] [phone]

## 2023-09-18 MED ORDER — FLUTICASONE-SALMETEROL 230-21 MCG/ACT IN AERO: 2.0000 | INHALATION_SPRAY | Freq: Two times a day (BID) | RESPIRATORY_TRACT | 1 refills | Status: DC

## 2023-09-18 NOTE — Addendum Note (Signed)
Addended by: Robet Leu A on: 09/18/2023 03:14 PM   Modules accepted: Orders

## 2023-12-02 ENCOUNTER — Emergency Department (HOSPITAL_COMMUNITY): Admission: EM | Admit: 2023-12-02 | Discharge: 2023-12-03 | Disposition: A | Discharge status: Home / Self Care

## 2023-12-02 ENCOUNTER — Emergency Department (HOSPITAL_COMMUNITY)

## 2023-12-02 ENCOUNTER — Other Ambulatory Visit: Payer: Self-pay

## 2023-12-02 ENCOUNTER — Encounter (HOSPITAL_COMMUNITY): Payer: Self-pay

## 2023-12-02 DIAGNOSIS — J210 Acute bronchiolitis due to respiratory syncytial virus: Secondary | ICD-10-CM

## 2023-12-02 DIAGNOSIS — R000 Tachycardia, unspecified: Secondary | ICD-10-CM | POA: Diagnosis not present

## 2023-12-02 DIAGNOSIS — Z79899 Other long term (current) drug therapy: Secondary | ICD-10-CM | POA: Insufficient documentation

## 2023-12-02 DIAGNOSIS — R059 Cough, unspecified: Secondary | ICD-10-CM | POA: Diagnosis present

## 2023-12-02 DIAGNOSIS — J45909 Unspecified asthma, uncomplicated: Secondary | ICD-10-CM | POA: Insufficient documentation

## 2023-12-02 DIAGNOSIS — Z7951 Long term (current) use of inhaled steroids: Secondary | ICD-10-CM | POA: Insufficient documentation

## 2023-12-02 DIAGNOSIS — R0602 Shortness of breath: Secondary | ICD-10-CM | POA: Diagnosis present

## 2023-12-02 DIAGNOSIS — R7989 Other specified abnormal findings of blood chemistry: Secondary | ICD-10-CM | POA: Diagnosis not present

## 2023-12-02 DIAGNOSIS — Z7982 Long term (current) use of aspirin: Secondary | ICD-10-CM | POA: Diagnosis not present

## 2023-12-02 DIAGNOSIS — I10 Essential (primary) hypertension: Secondary | ICD-10-CM | POA: Insufficient documentation

## 2023-12-02 DIAGNOSIS — J168 Pneumonia due to other specified infectious organisms: Secondary | ICD-10-CM | POA: Diagnosis not present

## 2023-12-02 DIAGNOSIS — J189 Pneumonia, unspecified organism: Secondary | ICD-10-CM

## 2023-12-02 DIAGNOSIS — R051 Acute cough: Secondary | ICD-10-CM

## 2023-12-02 LAB — CBC WITH DIFFERENTIAL/PLATELET
Abs Immature Granulocytes: 0.04 10*3/uL (ref 0.00–0.07)
Basophils Absolute: 0.1 10*3/uL (ref 0.0–0.1)
Basophils Relative: 0 %
Eosinophils Absolute: 0.1 10*3/uL (ref 0.0–0.5)
Eosinophils Relative: 1 %
HCT: 38.7 % (ref 36.0–46.0)
Hemoglobin: 13.3 g/dL (ref 12.0–15.0)
Immature Granulocytes: 0 %
Lymphocytes Relative: 13 %
Lymphs Abs: 1.5 10*3/uL (ref 0.7–4.0)
MCH: 31.3 pg (ref 26.0–34.0)
MCHC: 34.4 g/dL (ref 30.0–36.0)
MCV: 91.1 fL (ref 80.0–100.0)
Monocytes Absolute: 1.5 10*3/uL — ABNORMAL HIGH (ref 0.1–1.0)
Monocytes Relative: 12 %
Neutro Abs: 8.6 10*3/uL — ABNORMAL HIGH (ref 1.7–7.7)
Neutrophils Relative %: 74 %
Platelets: 319 10*3/uL (ref 150–400)
RBC: 4.25 MIL/uL (ref 3.87–5.11)
RDW: 11.7 % (ref 11.5–15.5)
WBC: 11.8 10*3/uL — ABNORMAL HIGH (ref 4.0–10.5)
nRBC: 0 % (ref 0.0–0.2)

## 2023-12-02 LAB — COMPREHENSIVE METABOLIC PANEL
ALT: 20 U/L (ref 0–44)
AST: 18 U/L (ref 15–41)
Albumin: 3.5 g/dL (ref 3.5–5.0)
Alkaline Phosphatase: 77 U/L (ref 38–126)
Anion gap: 14 (ref 5–15)
BUN: 15 mg/dL (ref 8–23)
CO2: 27 mmol/L (ref 22–32)
Calcium: 9.6 mg/dL (ref 8.9–10.3)
Chloride: 98 mmol/L (ref 98–111)
Creatinine, Ser: 1.06 mg/dL — ABNORMAL HIGH (ref 0.44–1.00)
GFR, Estimated: 55 mL/min — ABNORMAL LOW (ref >60)
Glucose, Bld: 145 mg/dL — ABNORMAL HIGH (ref 70–99)
Potassium: 3.4 mmol/L — ABNORMAL LOW (ref 3.5–5.1)
Sodium: 139 mmol/L (ref 135–145)
Total Bilirubin: 0.9 mg/dL (ref 0.0–1.2)
Total Protein: 8.2 g/dL — ABNORMAL HIGH (ref 6.5–8.1)

## 2023-12-02 LAB — D-DIMER, QUANTITATIVE: D-Dimer, Quant: 1.35 ug{FEU}/mL — ABNORMAL HIGH (ref 0.00–0.50)

## 2023-12-02 LAB — RESP PANEL BY RT-PCR (RSV, FLU A&B, COVID)  RVPGX2
Influenza A by PCR: NEGATIVE
Influenza B by PCR: NEGATIVE
Resp Syncytial Virus by PCR: POSITIVE — AB
SARS Coronavirus 2 by RT PCR: NEGATIVE

## 2023-12-02 LAB — BRAIN NATRIURETIC PEPTIDE: B Natriuretic Peptide: 30 pg/mL (ref 0.0–100.0)

## 2023-12-02 LAB — TROPONIN I (HIGH SENSITIVITY)
Troponin I (High Sensitivity): 5 ng/L (ref <18)
Troponin I (High Sensitivity): 4 ng/L (ref <18)

## 2023-12-02 MED ORDER — POTASSIUM CHLORIDE CRYS ER 20 MEQ PO TBCR
40.0000 meq | EXTENDED_RELEASE_TABLET | Freq: Once | ORAL | Status: AC
  Administered 2023-12-02: 40 meq via ORAL
  Filled 2023-12-02: qty 2

## 2023-12-02 MED ORDER — IOHEXOL 350 MG/ML SOLN
75.0000 mL | Freq: Once | INTRAVENOUS | Status: AC | PRN
  Administered 2023-12-02: 75 mL via INTRAVENOUS

## 2023-12-02 MED ORDER — ALBUTEROL SULFATE (2.5 MG/3ML) 0.083% IN NEBU
5.0000 mg | INHALATION_SOLUTION | Freq: Once | RESPIRATORY_TRACT | Status: AC
  Administered 2023-12-02: 5 mg via RESPIRATORY_TRACT
  Filled 2023-12-02: qty 6

## 2023-12-02 MED ORDER — IPRATROPIUM-ALBUTEROL 0.5-2.5 (3) MG/3ML IN SOLN
3.0000 mL | Freq: Once | RESPIRATORY_TRACT | Status: AC
  Administered 2023-12-02: 3 mL via RESPIRATORY_TRACT
  Filled 2023-12-02: qty 3

## 2023-12-02 MED ORDER — BENZONATATE 100 MG PO CAPS
200.0000 mg | ORAL_CAPSULE | Freq: Once | ORAL | Status: AC
  Administered 2023-12-02: 200 mg via ORAL
  Filled 2023-12-02: qty 2

## 2023-12-02 NOTE — ED Triage Notes (Signed)
 Pt arrived via POV c/o SOB, cough and weakness X 3 weeks. Pt reports going to [facility] and being prescribed steroids and antibiotics w/o relief.

## 2023-12-02 NOTE — ED Provider Notes (Signed)
 [facility] AT [facility] Provider Note   CSN: [id number] Arrival date & time: 12/02/23  1646     History  Chief Complaint  Patient presents with   Weakness    Caitlin Tanner is a 73 y.o. female.   Weakness Associated symptoms: cough and shortness of breath   Associated symptoms: no abdominal pain, no arthralgias, no chest pain, no diarrhea, no dizziness, no dysuria, no fever, no nausea and no vomiting        Caitlin Tanner is a 73 y.o. female with past medical history of asthma, hypertension, GERD, borderline diabetes who presents to the Emergency Department complaining of persistent cough, shortness of breath and generalized weakness.  Symptoms present for 3 weeks.  Spouse also with similar symptoms diagnosed yesterday with pneumonia.  She has been seen twice at another ER facility.  Diagnosed with bronchitis.  Has completed 5-day course of prednisone and Augmentin without relief.  Also have prescription cough syrup without improvement.  No recent fevers or chills.  Cough has been productive of mostly clear sputum.  Describes having some orthopnea as well.  No peripheral edema, abdominal pain, nausea vomiting or diarrhea.  Home Medications Prior to Admission medications   Medication Sig Start Date End Date Taking? Authorizing Provider  nystatin (MYCOSTATIN) 100000 UNIT/ML suspension Take 5 mLs by mouth 4 (four) times daily. 11/26/23  Yes [provider]  albuterol (VENTOLIN HFA) 108 (90 Base) MCG/ACT inhaler Inhale 2 puffs into the lungs every 4 (four) hours as needed for wheezing or shortness of breath. 07/31/23   Alfonse Spruce, MD  aspirin 81 MG EC tablet Take 81 mg by mouth daily. 09/08/17   [provider]  Azelastine-Fluticasone (DYMISTA) 137-50 MCG/ACT SUSP Place 2 sprays into the nose 2 (two) times daily. 07/31/23   Alfonse Spruce, MD  bismuth subsalicylate (PEPTO BISMOL) 262 MG/15ML suspension Take 30 mLs by mouth  daily as needed for indigestion or diarrhea or loose stools.     [provider]  chlorpheniramine-HYDROcodone (TUSSIONEX) 10-8 MG/5ML Take 5 mLs by mouth every 12 (twelve) hours as needed for cough. 11/26/23   [provider]  Cholecalciferol (VITAMIN D3) 25 MCG (1000 UT) CAPS Take 2,000 Units by mouth daily.     [provider]  Cranberry-Vitamin C-Probiotic (AZO CRANBERRY PO) Take 1 tablet by mouth in the morning and at bedtime.    [provider]  escitalopram (LEXAPRO) 10 MG tablet Take 10 mg by mouth daily.     [provider]  ezetimibe (ZETIA) 10 MG tablet Take 10 mg by mouth daily.    [provider]  famotidine (PEPCID) 40 MG tablet Take 1 tablet (40 mg total) by mouth daily. 07/31/23   Alfonse Spruce, MD  fluticasone-salmeterol (ADVAIR HFA) [phone] MCG/ACT inhaler Inhale 2 puffs into the lungs 2 (two) times daily. 09/18/23   Alfonse Spruce, MD  GARLIC PO Take 1 capsule by mouth daily. 03/31/20   [provider]  loratadine (CLARITIN) 10 MG tablet Take 1 tablet (10 mg total) by mouth daily as needed for allergies (Can take an extra dose during flare ups.). 03/27/23   Alfonse Spruce, MD  LORazepam (ATIVAN) 0.5 MG tablet Take 0.5 mg by mouth daily as needed for anxiety.     [provider]  losartan-hydrochlorothiazide (HYZAAR) 50-12.5 MG per tablet Take 1 tablet by mouth daily.    [provider]  meloxicam (MOBIC) 7.5 MG tablet Take 7.5  mg by mouth 2 (two) times daily. 06/04/22   [provider]  metFORMIN (GLUCOPHAGE) 500 MG tablet Take 500 mg by mouth See admin instructions. 500 mg in the morning if blood sugar is above 140, 500 mg every evening 07/15/21   [provider]  metoprolol (TOPROL-XL) 50 MG 24 hr tablet Take 50 mg by mouth daily.    [provider]  Multiple Vitamin (MULTIVITAMIN) capsule Take 1 capsule by mouth daily.    [provider]  Multiple  Vitamins-Minerals (PRESERVISION AREDS 2) CAPS Take 1 capsule by mouth in the morning and at bedtime.    [provider]  Omega-3 Fatty Acids (FISH OIL) 1000 MG CAPS Take 1,000 mg by mouth daily.    [provider]  pantoprazole (PROTONIX) 40 MG tablet Take 1 tablet (40 mg total) by mouth daily. 07/31/23   Alfonse Spruce, MD  Spacer/Aero-Holding Deretha Emory DEVI 1 Device by Does not apply route as directed. 03/27/23   Alfonse Spruce, MD  tretinoin (RETIN-A) 0.05 % cream Apply 1 application  topically at bedtime. 07/18/21   [provider]      Allergies    Conj estrog-medroxyprogest ace, Ketorolac tromethamine, Sulfa antibiotics, and Tramadol hcl    Review of Systems   Review of Systems  Constitutional:  Negative for chills and fever.  HENT:  Negative for sore throat and trouble swallowing.   Respiratory:  Positive for cough, chest tightness and shortness of breath.   Cardiovascular:  Negative for chest pain and leg swelling.  Gastrointestinal:  Negative for abdominal pain, diarrhea, nausea and vomiting.  Genitourinary:  Negative for dysuria.  Musculoskeletal:  Negative for arthralgias and back pain.  Neurological:  Positive for weakness. Negative for dizziness, syncope and numbness.    Physical Exam Updated Vital Signs BP (!) 172/92   Pulse (!) 114   Temp 98.6 F (37 C) (Temporal)   Resp (!) 25   Ht 5\' 6"  (1.676 m)   Wt 80.5 kg   SpO2 92%   BMI 28.64 kg/m  Physical Exam Vitals and nursing note reviewed.  Constitutional:      General: She is not in acute distress.    Appearance: Normal appearance. She is not ill-appearing or toxic-appearing.  Cardiovascular:     Rate and Rhythm: Regular rhythm. Tachycardia present.     Pulses: Normal pulses.  Pulmonary:     Effort: Pulmonary effort is normal.     Breath sounds: No wheezing, rhonchi or rales.     Comments: Lung sounds slightly diminished bilaterally.  I do not appreciate any rales or  wheezing.  Patient actively coughing, no increased work of breathing. Neurological:     Mental Status: She is alert.     ED Results / Procedures / Treatments   Labs (all labs ordered are listed, but only abnormal results are displayed) Labs Reviewed  RESP PANEL BY RT-PCR (RSV, FLU A&B, COVID)  RVPGX2 - Abnormal; Notable for the following components:      Result Value   Resp Syncytial Virus by PCR POSITIVE (*)    All other components within normal limits  COMPREHENSIVE METABOLIC PANEL - Abnormal; Notable for the following components:   Potassium 3.4 (*)    Glucose, Bld 145 (*)    Creatinine, Ser 1.06 (*)    Total Protein 8.2 (*)    GFR, Estimated 55 (*)    All other components within normal limits  CBC WITH DIFFERENTIAL/PLATELET - Abnormal; Notable for the following components:  WBC 11.8 (*)    Neutro Abs 8.6 (*)    Monocytes Absolute 1.5 (*)    All other components within normal limits  D-DIMER, QUANTITATIVE - Abnormal; Notable for the following components:   D-Dimer, Quant 1.35 (*)    All other components within normal limits  BRAIN NATRIURETIC PEPTIDE  TROPONIN I (HIGH SENSITIVITY)  TROPONIN I (HIGH SENSITIVITY)    EKG EKG Interpretation Date/Time:  Wednesday December 02 2023 17:22:12 EST Ventricular Rate:  113 PR Interval:  118 QRS Duration:  86 QT Interval:  314 QTC Calculation: 430 R Axis:   27  Text Interpretation: Sinus tachycardia Nonspecific ST abnormality Abnormal ECG No previous ECGs available Confirmed by Beckey Downing [phone]) on 12/02/2023 5:29:00 PM  Radiology DG Chest 1 View Result Date: 12/02/2023 CLINICAL DATA:  Shortness of breath EXAM: PORTABLE CHEST 1 VIEW COMPARISON:  None Available. FINDINGS: Cardiac shadow is within normal limits. Lungs are well aerated bilaterally. No sizable effusion is noted. Patchy airspace opacity is noted in the right base consistent with early infiltrate. No other focal abnormality is noted. IMPRESSION: Early right basilar  infiltrate. Electronically Signed   By: Alcide Clever M.D.   On: 12/02/2023 20:44    Procedures Procedures    Medications Ordered in ED Medications - No data to display  ED Course/ Medical Decision Making/ A&P                                 Medical Decision Making Patient here from home for evaluation of 1 month history of cough.  Cough has been productive at times, also endorses generalized weakness.  She has been seen at another hospital facility treated with Augmentin and prednisone without improvement.  Does not have supplemental oxygen requirement at baseline.  No recent fevers.  Denies any chest pain.  I suspect pneumonia, but viral process, PE, ACS, bronchitis also considered  Amount and/or Complexity of Data Reviewed Labs: ordered.    Details: Labs no significant leukocytosis, chemistries without significant derangement.  BNP unremarkable.  Delta troponin reassuring.  D-dimer elevated at 1.35, respiratory panel positive for RSV Radiology: ordered.    Details: Chest x-ray shows early right basilar infiltrate  D-dimer was elevated so CT angio of the chest is pending ECG/medicine tests: ordered.    Details: EKG shows sinus tachycardia nonspecific ST abnormality.  No previous EKG available for comparison Discussion of management or test interpretation with external provider(s):  I suspect pneumonia.  She also has positive RSV.  Sats in the lower 90s here no obvious respiratory distress.  Lung sounds slightly improved after neb.  She was also given Tessalon  On recheck, patient reports improvement of her cough after the Tessalon.  Will need additional neb.  Discussed findings with overnight provider, Dr. Pilar Plate who agrees to review CT angio results.  If CT negative for PE and patient is without hypoxia.  I feel it would be reasonable to treat outpatient with antibiotics  Risk Prescription drug management.           Final Clinical Impression(s) / ED Diagnoses Final  diagnoses:  Acute cough  RSV (acute bronchiolitis due to respiratory syncytial virus)    Rx / DC Orders ED Discharge Orders     None         Pauline Aus, PA-C 12/02/23 2332    Durwin Glaze, MD 12/03/23 2351

## 2023-12-03 MED ORDER — DOXYCYCLINE HYCLATE 100 MG PO CAPS: 100.0000 mg | ORAL_CAPSULE | Freq: Two times a day (BID) | ORAL | 0 refills | Status: AC

## 2023-12-03 MED ORDER — BENZONATATE 100 MG PO CAPS
100.0000 mg | ORAL_CAPSULE | Freq: Three times a day (TID) | ORAL | 0 refills | Status: DC
Start: 2023-12-03 — End: 2024-01-29

## 2023-12-03 NOTE — ED Provider Notes (Signed)
  Provider Note MRN:  [id number]  Arrival date & time: 12/03/23    ED Course and Medical Decision Making  Assumed care of patient at sign-out or upon transfer.  Persistent cough, RSV infection, awaiting CT imaging.  12 AM update: CT imaging is negative for PE, there is evidence of multifocal pneumonia, question secondary bacterial pneumonia.  Patient is doing well on room air, no acute distress, no hypoxia, appropriate for discharge.  Procedures  Final Clinical Impressions(s) / ED Diagnoses     ICD-10-CM   1. Acute cough  R05.1     2. RSV (acute bronchiolitis due to respiratory syncytial virus)  J21.0     3. Pneumonia due to infectious organism, unspecified laterality, unspecified part of lung  J18.9       ED Discharge Orders          Ordered    doxycycline (VIBRAMYCIN) 100 MG capsule  2 times daily        12/03/23 0015    benzonatate (TESSALON) 100 MG capsule  Every 8 hours        12/03/23 0015              Discharge Instructions      You were evaluated in the Emergency Department and after careful evaluation, we did not find any emergent condition requiring admission or further testing in the hospital.  Your exam/testing today is overall reassuring.  Symptoms seem to be due to RSV infection which has possibly led to a secondary bacterial pneumonia.  Take doxycycline antibiotic as directed.  Can use the Tessalon as needed for cough.  Please return to the Emergency Department if you experience any worsening of your condition.   Thank you for allowing [place] to be a part of your care.    Elmer Sow. Pilar Plate, MD [facility] Emergency Medicine [facility] [email or web address]    Sabas Sous, MD 12/03/23 [phone]

## 2023-12-03 NOTE — Discharge Instructions (Signed)
 You were evaluated in the Emergency Department and after careful evaluation, we did not find any emergent condition requiring admission or further testing in the hospital.  Your exam/testing today is overall reassuring.  Symptoms seem to be due to RSV infection which has possibly led to a secondary bacterial pneumonia.  Take doxycycline antibiotic as directed.  Can use the Tessalon as needed for cough.  Please return to the Emergency Department if you experience any worsening of your condition.   Thank you for allowing [place] to be a part of your care.

## 2024-01-29 ENCOUNTER — Encounter: Payer: Self-pay | Admitting: Allergy & Immunology

## 2024-01-29 ENCOUNTER — Ambulatory Visit (INDEPENDENT_AMBULATORY_CARE_PROVIDER_SITE_OTHER): Payer: Medicare Other | Admitting: Allergy & Immunology

## 2024-01-29 VITALS — BP 138/80 | HR 86 | Temp 98.4°F | Resp 16 | Ht 65.95 in | Wt 163.2 lb

## 2024-01-29 DIAGNOSIS — J4540 Moderate persistent asthma, uncomplicated: Secondary | ICD-10-CM | POA: Diagnosis not present

## 2024-01-29 DIAGNOSIS — K219 Gastro-esophageal reflux disease without esophagitis: Secondary | ICD-10-CM | POA: Diagnosis not present

## 2024-01-29 DIAGNOSIS — J310 Chronic rhinitis: Secondary | ICD-10-CM | POA: Diagnosis not present

## 2024-01-29 DIAGNOSIS — R918 Other nonspecific abnormal finding of lung field: Secondary | ICD-10-CM

## 2024-01-29 MED ORDER — LORATADINE 10 MG PO TABS: 10.0000 mg | ORAL_TABLET | Freq: Every day | ORAL | 1 refills | Status: DC | PRN

## 2024-01-29 MED ORDER — CETIRIZINE HCL 10 MG PO TABS: 10.0000 mg | ORAL_TABLET | Freq: Every day | ORAL | 1 refills | Status: DC

## 2024-01-29 MED ORDER — FLUTICASONE-SALMETEROL 230-21 MCG/ACT IN AERO
2.0000 | INHALATION_SPRAY | Freq: Two times a day (BID) | RESPIRATORY_TRACT | 1 refills | Status: AC
Start: 2024-01-29 — End: ?

## 2024-01-29 MED ORDER — AZELASTINE-FLUTICASONE 137-50 MCG/ACT NA SUSP: 2.0000 | Freq: Two times a day (BID) | NASAL | 1 refills | Status: AC

## 2024-01-29 MED ORDER — MUPIROCIN 2 % EX OINT: TOPICAL_OINTMENT | CUTANEOUS | 3 refills | Status: DC

## 2024-01-29 NOTE — Patient Instructions (Addendum)
`  Asthma - Lung testing is around 69% which is a better value compared to last time.  - We are not going to make any changes at this time.  - Daily controller medication(s): Advair  230/21 mcg two puffs twice daily with spacer - Prior to physical activity: albuterol  2 puffs 10-15 minutes before physical activity. - Rescue medications: albuterol  4 puffs every 4-6 hours as needed - Asthma control goals:  * Full participation in all desired activities (may need albuterol  before activity) * Albuterol  use two time or less a week on average (not counting use with activity) * Cough interfering with sleep two time or less a month * Oral steroids no more than once a year * No hospitalizations  Chronic rhinitis - Continue saline nasal rinse as needed for nasal symptoms. - Continue Dymista  nasal spray-using 2 sprays each nostril twice a day to help with nasal congestion and runny nose. - Continue with cetirizine 10mg  once daily. - You can always alternate the antihistamines to help with symptoms.   Reflux - Continue Protonix  once a day to help with reflux. - Continue with Pepcid  40mg  at night.  - Continue dietary and lifestyle modifications.  Return in about 6 months (around 07/31/2024). You can have the follow up appointment with Dr. Idolina Maker or a Nurse Practicioner (our Nurse Practitioners are excellent and always have Physician oversight!).    Please inform us  of any Emergency Department visits, hospitalizations, or changes in symptoms. Call us  before going to the ED for breathing or allergy symptoms since we might be able to fit you in for a sick visit. Feel free to contact us  anytime with any questions, problems, or concerns.  It was a pleasure to see you again today!  Websites that have reliable patient information: 1. American Academy of Asthma, Allergy, and Immunology: www.aaaai.org 2. Food Allergy Research and Education (FARE): foodallergy.org 3. Mothers of Asthmatics:  http://www.asthmacommunitynetwork.org 4. American College of Allergy, Asthma, and Immunology: www.acaai.org      "Like" us  on Facebook and Instagram for our latest updates!      A healthy democracy works best when [employer] participate! Make sure you are registered to vote! If you have moved or changed any of your contact information, you will need to get this updated before voting! Scan the QR codes below to learn more!

## 2024-01-29 NOTE — Progress Notes (Unsigned)
 FOLLOW UP  Date of Service/Encounter:  01/29/24   Assessment:   Moderate persistent asthma, uncomplicated with spirometry that has improved   Most recent chest CT with tree-in-bud nodular densities and new nodule - needs repeat chest CT in Jan 2025   Non-allergic rhinitis   Gastroesophageal reflux disease - on PPI + H2 blocker    Hoarseness  Plan/Recommendations:   There are no Patient Instructions on file for this visit.   Subjective:   Caitlin Tanner is a 73 y.o. female presenting today for follow up of No chief complaint on file.   Caitlin Tanner has a history of the following: Patient Active Problem List   Diagnosis Date Noted   Yeast dermatitis 05/12/2023   Prolapsed internal hemorrhoids, grade 3 02/12/2023   Acute cough 08/23/2021   Restrictive lung disease 01/08/2021   Dyspepsia 06/09/2019   Hematochezia 06/09/2019   Moderate persistent asthma, uncomplicated 03/11/2019   Acute non-recurrent maxillary sinusitis 03/11/2019   Chronic rhinitis 03/11/2019   Nausea without vomiting 07/08/2013   Need for prophylactic vaccination and inoculation against influenza 06/12/2013   Well controlled persistent asthma 06/12/2013   Multiple lung nodules 06/12/2013   Abdominal bloating 11/24/2011   Left lower quadrant pain 10/22/2011   Internal hemorrhoids 05/29/2009   Gastroesophageal reflux disease 05/29/2009   HEMATOCHEZIA 05/29/2009   Anxiety state 05/28/2009   IRRITABLE BOWEL SYNDROME 05/28/2009   DIARRHEA 05/28/2009   Allergy 05/28/2009    History obtained from: chart review and {Persons; PED relatives w/patient:19415::"patient"}.  Discussed the use of AI scribe software for clinical note transcription with the patient and/or guardian, who gave verbal consent to proceed.  Caitlin Tanner is a 73 y.o. female presenting for {Blank single:19197::"a food challenge","a drug challenge","skin testing","a sick visit","an evaluation of ***","a follow up visit"}.  She was last seen  in October 2024.  At that time, lung testing was around 62% which is better than where it had been.  We continue with Advair  230 mcg 2 puffs twice daily as well as albuterol  as needed.  For her chronic rhinitis, we continue with Dymista  as well as cetirizine.  For her reflux, we continued with Protonix  and Pepcid .  Since the last visit, she has done well.  Asthma/Respiratory Symptom History: ***  Allergic Rhinitis Symptom History: ***  Food Allergy Symptom History: ***  Skin Symptom History: ***  GERD Symptom History: ***  Infection Symptom History: ***  Otherwise, there have been no changes to her past medical history, surgical history, family history, or social history.    Review of systems otherwise negative other than that mentioned in the HPI.    Objective:   There were no vitals taken for this visit. There is no height or weight on file to calculate BMI.    Physical Exam   Diagnostic studies:    Spirometry: results normal (FEV1: 1.46/69%, FVC: 2.07/74%, FEV1/FVC: 71%).    Spirometry consistent with normal pattern. {Blank single:19197::"Albuterol /Atrovent  nebulizer","Xopenex/Atrovent  nebulizer","Albuterol  nebulizer","Albuterol  four puffs via MDI","Xopenex four puffs via MDI"} treatment given in clinic with {Blank single:19197::"significant improvement in FEV1 per ATS criteria","significant improvement in FVC per ATS criteria","significant improvement in FEV1 and FVC per ATS criteria","improvement in FEV1, but not significant per ATS criteria","improvement in FVC, but not significant per ATS criteria","improvement in FEV1 and FVC, but not significant per ATS criteria","no improvement"}.  Allergy Studies: {Blank single:19197::"none","deferred due to recent antihistamine use","deferred due to insurance stipulations that require a separate visit for testing","labs sent instead"," "}    {Blank single:19197::"Allergy testing  results were read and interpreted by myself,  documented by clinical staff."," "}      Drexel Gentles, MD  [facility] 

## 2024-02-01 ENCOUNTER — Telehealth: Payer: Self-pay

## 2024-02-01 ENCOUNTER — Other Ambulatory Visit (HOSPITAL_COMMUNITY): Payer: Self-pay

## 2024-02-01 NOTE — Telephone Encounter (Signed)
*  Asthma/Allergy  Pharmacy Patient Advocate Encounter   Received notification from CoverMyMeds that prior authorization for Azelastine -Fluticasone  137-50MCG/ACT suspension  is required/requested.   Insurance verification completed.   The patient is insured through [employer] .   Per test claim: The current 90 day co-pay is, $37.50.  No PA needed at this time. This test claim was processed through [facility]- copay amounts may vary at other pharmacies due to pharmacy/plan contracts, or as the patient moves through the different stages of their insurance plan.

## 2024-08-07 ENCOUNTER — Encounter (HOSPITAL_COMMUNITY): Payer: Self-pay | Admitting: Emergency Medicine

## 2024-08-07 ENCOUNTER — Other Ambulatory Visit: Payer: Self-pay

## 2024-08-07 ENCOUNTER — Observation Stay (HOSPITAL_COMMUNITY)
Admission: EM | Admit: 2024-08-07 | Discharge: 2024-08-10 | Disposition: A | Discharge status: Home / Self Care | Attending: Family Medicine | Admitting: Family Medicine

## 2024-08-07 ENCOUNTER — Emergency Department (HOSPITAL_COMMUNITY)

## 2024-08-07 DIAGNOSIS — M6281 Muscle weakness (generalized): Secondary | ICD-10-CM | POA: Insufficient documentation

## 2024-08-07 DIAGNOSIS — N390 Urinary tract infection, site not specified: Secondary | ICD-10-CM | POA: Insufficient documentation

## 2024-08-07 DIAGNOSIS — R7401 Elevation of levels of liver transaminase levels: Secondary | ICD-10-CM | POA: Diagnosis not present

## 2024-08-07 DIAGNOSIS — Z7901 Long term (current) use of anticoagulants: Secondary | ICD-10-CM | POA: Insufficient documentation

## 2024-08-07 DIAGNOSIS — Z87891 Personal history of nicotine dependence: Secondary | ICD-10-CM | POA: Diagnosis not present

## 2024-08-07 DIAGNOSIS — R739 Hyperglycemia, unspecified: Secondary | ICD-10-CM | POA: Insufficient documentation

## 2024-08-07 DIAGNOSIS — E860 Dehydration: Secondary | ICD-10-CM | POA: Diagnosis not present

## 2024-08-07 DIAGNOSIS — Z79899 Other long term (current) drug therapy: Secondary | ICD-10-CM | POA: Insufficient documentation

## 2024-08-07 DIAGNOSIS — R2681 Unsteadiness on feet: Secondary | ICD-10-CM | POA: Diagnosis not present

## 2024-08-07 DIAGNOSIS — R531 Weakness: Secondary | ICD-10-CM | POA: Diagnosis present

## 2024-08-07 DIAGNOSIS — A419 Sepsis, unspecified organism: Secondary | ICD-10-CM | POA: Diagnosis not present

## 2024-08-07 DIAGNOSIS — E8729 Other acidosis: Secondary | ICD-10-CM | POA: Diagnosis not present

## 2024-08-07 DIAGNOSIS — Z7982 Long term (current) use of aspirin: Secondary | ICD-10-CM | POA: Insufficient documentation

## 2024-08-07 DIAGNOSIS — R112 Nausea with vomiting, unspecified: Secondary | ICD-10-CM | POA: Diagnosis not present

## 2024-08-07 DIAGNOSIS — Z1152 Encounter for screening for COVID-19: Secondary | ICD-10-CM | POA: Diagnosis not present

## 2024-08-07 DIAGNOSIS — N179 Acute kidney failure, unspecified: Secondary | ICD-10-CM | POA: Insufficient documentation

## 2024-08-07 LAB — CBC
HCT: 37.5 % (ref 36.0–46.0)
Hemoglobin: 13.1 g/dL (ref 12.0–15.0)
MCH: 31.6 pg (ref 26.0–34.0)
MCHC: 34.9 g/dL (ref 30.0–36.0)
MCV: 90.4 fL (ref 80.0–100.0)
Platelets: 110 K/uL — ABNORMAL LOW (ref 150–400)
RBC: 4.15 MIL/uL (ref 3.87–5.11)
RDW: 12.9 % (ref 11.5–15.5)
WBC: 16.2 K/uL — ABNORMAL HIGH (ref 4.0–10.5)
nRBC: 0 % (ref 0.0–0.2)

## 2024-08-07 LAB — RESP PANEL BY RT-PCR (RSV, FLU A&B, COVID)  RVPGX2
Influenza A by PCR: NEGATIVE
Influenza B by PCR: NEGATIVE
Resp Syncytial Virus by PCR: NEGATIVE
SARS Coronavirus 2 by RT PCR: NEGATIVE

## 2024-08-07 LAB — COMPREHENSIVE METABOLIC PANEL WITH GFR
ALT: 43 U/L (ref 0–44)
AST: 59 U/L — ABNORMAL HIGH (ref 15–41)
Albumin: 4 g/dL (ref 3.5–5.0)
Alkaline Phosphatase: 103 U/L (ref 38–126)
Anion gap: 15 (ref 5–15)
BUN: 34 mg/dL — ABNORMAL HIGH (ref 8–23)
CO2: 23 mmol/L (ref 22–32)
Calcium: 8.8 mg/dL — ABNORMAL LOW (ref 8.9–10.3)
Chloride: 99 mmol/L (ref 98–111)
Creatinine, Ser: 1.77 mg/dL — ABNORMAL HIGH (ref 0.44–1.00)
GFR, Estimated: 29 mL/min — ABNORMAL LOW (ref >60)
Glucose, Bld: 164 mg/dL — ABNORMAL HIGH (ref 70–99)
Potassium: 3.6 mmol/L (ref 3.5–5.1)
Sodium: 137 mmol/L (ref 135–145)
Total Bilirubin: 1.1 mg/dL (ref 0.0–1.2)
Total Protein: 6.7 g/dL (ref 6.5–8.1)

## 2024-08-07 LAB — PROTIME-INR
INR: 1.3 — ABNORMAL HIGH (ref 0.8–1.2)
Prothrombin Time: 16.4 s — ABNORMAL HIGH (ref 11.4–15.2)

## 2024-08-07 LAB — LACTIC ACID, PLASMA: Lactic Acid, Venous: 2 mmol/L (ref 0.5–1.9)

## 2024-08-07 MED ORDER — LACTATED RINGERS IV BOLUS
1000.0000 mL | Freq: Once | INTRAVENOUS | Status: AC
  Administered 2024-08-07: 1000 mL via INTRAVENOUS

## 2024-08-07 MED ORDER — SODIUM CHLORIDE 0.9 % IV SOLN
2.0000 g | Freq: Once | INTRAVENOUS | Status: AC
  Administered 2024-08-07: 2 g via INTRAVENOUS
  Filled 2024-08-07: qty 12.5

## 2024-08-07 NOTE — ED Provider Notes (Signed)
  EMERGENCY DEPARTMENT AT [facility] Provider Note   CSN: [id number] Arrival date & time: 08/07/24  1911     History {Add pertinent medical, surgical, social history, OB history to HPI:1} Chief Complaint  Patient presents with  . Emesis  . Weakness    Caitlin Tanner is a 73 y.o. female with PMH as listed below who presents with Pt complains of nausea, vomiting today, diarrhea yesterday, low back pain and headache today. Denies fever. .    Past Medical History:  Diagnosis Date  . Anxiety disorder   . Asthma   . Borderline diabetes   . Diverticulosis   . GERD (gastroesophageal reflux disease)   . Hemorrhoids   . Hypertension   . IBS (irritable bowel syndrome)   . S/P colonoscopy 12/23/2010   Dr Rourk-diverticulosis, anal canal hemorhhoids, anal papilla, random bx benign  . S/P endoscopy 12/23/2010   Dr Vic, tubular esophagus, sm HH, minimal retained food, bile-stained mucus, otherwise normal,, esophageal bx  benign       Home Medications Prior to Admission medications   Medication Sig Start Date End Date Taking? Authorizing Provider  albuterol  (VENTOLIN  HFA) 108 (90 Base) MCG/ACT inhaler Inhale 2 puffs into the lungs every 4 (four) hours as needed for wheezing or shortness of breath. 07/31/23   Iva Marty Saltness, MD  aspirin 81 MG EC tablet Take 81 mg by mouth daily. 09/08/17   [provider]  Azelastine -Fluticasone  (DYMISTA ) 137-50 MCG/ACT SUSP Place 2 sprays into the nose 2 (two) times daily. 01/29/24   Iva Marty Saltness, MD  bismuth subsalicylate (PEPTO BISMOL) 262 MG/15ML suspension Take 30 mLs by mouth daily as needed for indigestion or diarrhea or loose stools.     [provider]  cetirizine  (ZYRTEC ) 10 MG tablet Take 1 tablet (10 mg total) by mouth daily. 01/29/24   Iva Marty Saltness, MD  Cholecalciferol (VITAMIN D3) 25 MCG (1000 UT) CAPS Take 2,000 Units by mouth daily.     [provider]   Cranberry-Vitamin C-Probiotic (AZO CRANBERRY PO) Take 1 tablet by mouth in the morning and at bedtime.    [provider]  escitalopram (LEXAPRO) 10 MG tablet Take 10 mg by mouth daily.     [provider]  ezetimibe (ZETIA) 10 MG tablet Take 10 mg by mouth daily.    [provider]  famotidine  (PEPCID ) 40 MG tablet Take 1 tablet (40 mg total) by mouth daily. 07/31/23   Iva Marty Saltness, MD  fluticasone -salmeterol (ADVAIR  HFA) 230-21 MCG/ACT inhaler Inhale 2 puffs into the lungs 2 (two) times daily. 01/29/24   Iva Marty Saltness, MD  GARLIC PO Take 1 capsule by mouth daily. 03/31/20   [provider]  loratadine  (CLARITIN ) 10 MG tablet Take 1 tablet (10 mg total) by mouth daily as needed for allergies (Can take an extra dose during flare ups.). 01/29/24   Iva Marty Saltness, MD  LORazepam (ATIVAN) 0.5 MG tablet Take 0.5 mg by mouth daily as needed for anxiety.     [provider]  losartan-hydrochlorothiazide (HYZAAR) 50-12.5 MG per tablet Take 1 tablet by mouth daily.    [provider]  meloxicam (MOBIC) 7.5 MG tablet Take 7.5 mg by mouth 2 (two) times daily. 06/04/22   [provider]  metFORMIN (GLUCOPHAGE) 500 MG tablet Take 500 mg by mouth See admin instructions. 500 mg in the morning if blood sugar is above 140, 500 mg every evening 07/15/21   [provider]  metoprolol (TOPROL-XL) 50 MG 24 hr tablet Take 50 mg by mouth daily.    [provider]  Multiple Vitamin (MULTIVITAMIN) capsule Take 1 capsule by mouth daily.    [provider]  Multiple Vitamins-Minerals (PRESERVISION AREDS 2) CAPS Take 1 capsule by mouth in the morning and at bedtime.    [provider]  mupirocin  ointment (BACTROBAN ) 2 % Apply into each nostril twice daily to help heal the lesion. 01/29/24   Iva Marty Saltness, MD  Omega-3 Fatty Acids (FISH OIL) 1000 MG CAPS Take 1,000 mg by mouth daily.    [provider]   pantoprazole  (PROTONIX ) 40 MG tablet Take 1 tablet (40 mg total) by mouth daily. 07/31/23   Iva Marty Saltness, MD  Spacer/Aero-Holding Raguel DEVI 1 Device by Does not apply route as directed. 03/27/23   Iva Marty Saltness, MD  tretinoin (RETIN-A) 0.05 % cream Apply 1 application  topically at bedtime. 07/18/21   [provider]  triamcinolone cream (KENALOG) 0.1 % SMARTSIG:1 Application Topical 2-3 Times Daily 01/28/24   [provider]      Allergies    Conj estrog-medroxyprogest ace, Ketorolac tromethamine, Sulfa antibiotics, and Tramadol hcl    Review of Systems   Review of Systems A 10 point review of systems was performed and is negative unless otherwise reported in HPI.  Physical Exam Updated Vital Signs BP (!) 73/54   Pulse 90   Temp 98.6 F (37 C) (Oral)   Resp 18   Wt 74 kg   SpO2 93%   BMI 26.38 kg/m  Physical Exam General: Normal appearing {Desc; female/female:11659}, lying in bed.  HEENT: PERRLA, Sclera anicteric, MMM, trachea midline.  Cardiology: RRR, no murmurs/rubs/gallops. BL radial and DP pulses equal bilaterally.  Resp: Normal respiratory rate and effort. CTAB, no wheezes, rhonchi, crackles.  Abd: Soft, non-tender, non-distended. No rebound tenderness or guarding.  GU: Deferred. MSK: No peripheral edema or signs of trauma. Extremities without deformity or TTP. No cyanosis or clubbing. Skin: warm, dry. No rashes or lesions. Back: No CVA tenderness Neuro: A&Ox4, CNs II-XII grossly intact. MAEs. Sensation grossly intact.  Psych: Normal mood and affect.   ED Results / Procedures / Treatments   Labs (all labs ordered are listed, but only abnormal results are displayed) Labs Reviewed  COMPREHENSIVE METABOLIC PANEL WITH GFR - Abnormal; Notable for the following components:      Result Value   Glucose, Bld 164 (*)    BUN 34 (*)    Creatinine, Ser 1.77 (*)    Calcium 8.8 (*)    AST 59 (*)    GFR, Estimated 29 (*)    All other  components within normal limits  CBC - Abnormal; Notable for the following components:   WBC 16.2 (*)    Platelets 110 (*)    All other components within normal limits  PROTIME-INR - Abnormal; Notable for the following components:   Prothrombin Time 16.4 (*)    INR 1.3 (*)    All other components within normal limits  URINALYSIS, ROUTINE W REFLEX MICROSCOPIC  CBG MONITORING, ED    EKG None  Radiology No results found.  Procedures Procedures  {Document cardiac monitor, telemetry assessment procedure when appropriate:1}  Medications Ordered in ED Medications - No data to display  ED Course/ Medical Decision Making/ A&P                          Medical Decision Making Amount  and/or Complexity of Data Reviewed Labs: ordered.    This patient presents to the ED for concern of ***, this involves an extensive number of treatment options, and is a complaint that carries with it a high risk of complications and morbidity.  I considered the following differential and admission for this acute, potentially life threatening condition.   MDM:    ***  Clinical Course as of 08/07/24 2221  Sun Aug 07, 2024  2140 BP(!): 73/54 Called code sepsis for presumed septic shock, will pressure bag fluids [HN]  2150 WBC(!): 16.2 [HN]  2150 Creatinine(!): 1.77 [HN]  2150 BUN(!): 34 +AKI [HN]  2215 Reported lower back pain, nausea/vmoiting, teeth chattering/chills yesterday. EMS came out to house and checked vitals signs, thought it was fine. Occurred again today. Husband states that she became sick very acutely today.   Consider dehydration vs septic shock w/ hypotension down to 70s systolic that improved w/ fluids. [HN]    Clinical Course User Index [HN] Franklyn Sid SAILOR, MD    Labs: I Ordered, and personally interpreted labs.  The pertinent results include:  ***  Imaging Studies ordered: I ordered imaging studies including *** I independently visualized and interpreted imaging. I  agree with the radiologist interpretation  Additional history obtained from ***.  External records from outside source obtained and reviewed including ***  Cardiac Monitoring: .The patient was maintained on a cardiac monitor.  I personally viewed and interpreted the cardiac monitored which showed an underlying rhythm of: ***  Reevaluation: After the interventions noted above, I reevaluated the patient and found that they have :{resolved/improved/worsened:23923::improved}  Social Determinants of Health: .***  Disposition:  ***  Co morbidities that complicate the patient evaluation . Past Medical History:  Diagnosis Date  . Anxiety disorder   . Asthma   . Borderline diabetes   . Diverticulosis   . GERD (gastroesophageal reflux disease)   . Hemorrhoids   . Hypertension   . IBS (irritable bowel syndrome)   . S/P colonoscopy 12/23/2010   Dr Rourk-diverticulosis, anal canal hemorhhoids, anal papilla, random bx benign  . S/P endoscopy 12/23/2010   Dr Vic, tubular esophagus, sm HH, minimal retained food, bile-stained mucus, otherwise normal,, esophageal bx  benign     Medicines No orders of the defined types were placed in this encounter.   I have reviewed the patients home medicines and have made adjustments as needed  Problem List / ED Course: Problem List Items Addressed This Visit   None        {Document critical care time when appropriate:1} {Document review of labs and clinical decision tools ie heart score, Chads2Vasc2 etc:1}  {Document your independent review of radiology images, and any outside records:1} {Document your discussion with family members, caretakers, and with consultants:1} {Document social determinants of health affecting pt's care:1} {Document your decision making why or why not admission, treatments were needed:1}  This note was created using dictation software, which may contain spelling or grammatical errors.

## 2024-08-07 NOTE — Sepsis Progress Note (Signed)
 Elink monitoring for the code sepsis protocol.

## 2024-08-07 NOTE — ED Triage Notes (Signed)
 Pt complains of nausea, vomiting today, diarrhea yesterday, low back pain and headache today. Denies fever.

## 2024-08-07 NOTE — ED Notes (Signed)
 MD Franklyn made aware of patient's BP.

## 2024-08-07 NOTE — Sepsis Progress Note (Addendum)
 Notified bedside nurse of need to draw blood cultures. Outreach to ED RN via secure chat to confirm antibiotic administration prior to [facility] collection.  2159-ED RN confirmed [facility] collection still pending, antibiotics just started.

## 2024-08-08 ENCOUNTER — Encounter (HOSPITAL_COMMUNITY): Payer: Self-pay | Admitting: Internal Medicine

## 2024-08-08 ENCOUNTER — Other Ambulatory Visit: Payer: Self-pay

## 2024-08-08 DIAGNOSIS — A419 Sepsis, unspecified organism: Secondary | ICD-10-CM

## 2024-08-08 LAB — COMPREHENSIVE METABOLIC PANEL WITH GFR
ALT: 37 U/L (ref 0–44)
AST: 42 U/L — ABNORMAL HIGH (ref 15–41)
Albumin: 3.7 g/dL (ref 3.5–5.0)
Alkaline Phosphatase: 100 U/L (ref 38–126)
Anion gap: 10 (ref 5–15)
BUN: 34 mg/dL — ABNORMAL HIGH (ref 8–23)
CO2: 28 mmol/L (ref 22–32)
Calcium: 8.5 mg/dL — ABNORMAL LOW (ref 8.9–10.3)
Chloride: 103 mmol/L (ref 98–111)
Creatinine, Ser: 1.65 mg/dL — ABNORMAL HIGH (ref 0.44–1.00)
GFR, Estimated: 32 mL/min — ABNORMAL LOW (ref >60)
Glucose, Bld: 95 mg/dL (ref 70–99)
Potassium: 3.4 mmol/L — ABNORMAL LOW (ref 3.5–5.1)
Sodium: 141 mmol/L (ref 135–145)
Total Bilirubin: 1.4 mg/dL — ABNORMAL HIGH (ref 0.0–1.2)
Total Protein: 6.3 g/dL — ABNORMAL LOW (ref 6.5–8.1)

## 2024-08-08 LAB — CBC WITH DIFFERENTIAL/PLATELET
Abs Immature Granulocytes: 0.49 K/uL — ABNORMAL HIGH (ref 0.00–0.07)
Basophils Absolute: 0.1 K/uL (ref 0.0–0.1)
Basophils Relative: 1 %
Eosinophils Absolute: 0 K/uL (ref 0.0–0.5)
Eosinophils Relative: 0 %
HCT: 35.7 % — ABNORMAL LOW (ref 36.0–46.0)
Hemoglobin: 12.1 g/dL (ref 12.0–15.0)
Immature Granulocytes: 3 %
Lymphocytes Relative: 6 %
Lymphs Abs: 1 K/uL (ref 0.7–4.0)
MCH: 30.9 pg (ref 26.0–34.0)
MCHC: 33.9 g/dL (ref 30.0–36.0)
MCV: 91.3 fL (ref 80.0–100.0)
Monocytes Absolute: 0.7 K/uL (ref 0.1–1.0)
Monocytes Relative: 5 %
Neutro Abs: 13 K/uL — ABNORMAL HIGH (ref 1.7–7.7)
Neutrophils Relative %: 85 %
Platelets: 95 K/uL — ABNORMAL LOW (ref 150–400)
RBC: 3.91 MIL/uL (ref 3.87–5.11)
RDW: 13.2 % (ref 11.5–15.5)
WBC: 15.2 K/uL — ABNORMAL HIGH (ref 4.0–10.5)
nRBC: 0 % (ref 0.0–0.2)

## 2024-08-08 LAB — URINALYSIS, ROUTINE W REFLEX MICROSCOPIC
Bilirubin Urine: NEGATIVE
Glucose, UA: NEGATIVE mg/dL
Hgb urine dipstick: NEGATIVE
Ketones, ur: NEGATIVE mg/dL
Nitrite: NEGATIVE
Protein, ur: NEGATIVE mg/dL
Specific Gravity, Urine: 1.008 (ref 1.005–1.030)
pH: 5 (ref 5.0–8.0)

## 2024-08-08 LAB — CBG MONITORING, ED
Glucose-Capillary: 105 mg/dL — ABNORMAL HIGH (ref 70–99)
Glucose-Capillary: 95 mg/dL (ref 70–99)

## 2024-08-08 LAB — PHOSPHORUS: Phosphorus: 2.9 mg/dL (ref 2.5–4.6)

## 2024-08-08 LAB — HEMOGLOBIN A1C
Hgb A1c MFr Bld: 6.2 % — ABNORMAL HIGH (ref 4.8–5.6)
Mean Plasma Glucose: 131.24 mg/dL

## 2024-08-08 LAB — MAGNESIUM: Magnesium: 1.8 mg/dL (ref 1.7–2.4)

## 2024-08-08 LAB — LACTIC ACID, PLASMA: Lactic Acid, Venous: 1.9 mmol/L (ref 0.5–1.9)

## 2024-08-08 MED ORDER — MAGNESIUM SULFATE 2 GM/50ML IV SOLN
2.0000 g | Freq: Once | INTRAVENOUS | Status: AC
  Administered 2024-08-08: 2 g via INTRAVENOUS
  Filled 2024-08-08: qty 50

## 2024-08-08 MED ORDER — MELATONIN 3 MG PO TABS: 6.0000 mg | ORAL_TABLET | Freq: Every evening | ORAL | Status: DC | PRN

## 2024-08-08 MED ORDER — OXYCODONE HCL 5 MG PO TABS
5.0000 mg | ORAL_TABLET | Freq: Four times a day (QID) | ORAL | Status: DC | PRN
  Administered 2024-08-08 – 2024-08-09 (×3): 5 mg via ORAL
  Filled 2024-08-08 (×3): qty 1

## 2024-08-08 MED ORDER — PROCHLORPERAZINE EDISYLATE 10 MG/2ML IJ SOLN: 5.0000 mg | Freq: Four times a day (QID) | INTRAMUSCULAR | Status: DC | PRN

## 2024-08-08 MED ORDER — FLUTICASONE FUROATE-VILANTEROL 200-25 MCG/ACT IN AEPB
1.0000 | INHALATION_SPRAY | Freq: Every day | RESPIRATORY_TRACT | Status: DC
  Administered 2024-08-08 – 2024-08-10 (×3): 1 via RESPIRATORY_TRACT
  Filled 2024-08-08: qty 28

## 2024-08-08 MED ORDER — POLYETHYLENE GLYCOL 3350 17 G PO PACK: 17.0000 g | PACK | Freq: Every day | ORAL | Status: DC | PRN

## 2024-08-08 MED ORDER — LORATADINE 10 MG PO TABS
10.0000 mg | ORAL_TABLET | Freq: Every day | ORAL | Status: DC
  Administered 2024-08-08 – 2024-08-10 (×3): 10 mg via ORAL
  Filled 2024-08-08 (×3): qty 1

## 2024-08-08 MED ORDER — SODIUM CHLORIDE 0.9 % IV SOLN
2.0000 g | INTRAVENOUS | Status: DC
  Administered 2024-08-08: 2 g via INTRAVENOUS
  Filled 2024-08-08: qty 12.5

## 2024-08-08 MED ORDER — ACETAMINOPHEN 500 MG PO TABS
500.0000 mg | ORAL_TABLET | Freq: Four times a day (QID) | ORAL | Status: AC | PRN
  Administered 2024-08-08 – 2024-08-10 (×4): 500 mg via ORAL
  Filled 2024-08-08 (×4): qty 1

## 2024-08-08 MED ORDER — POTASSIUM CHLORIDE CRYS ER 20 MEQ PO TBCR
40.0000 meq | EXTENDED_RELEASE_TABLET | Freq: Once | ORAL | Status: AC
  Administered 2024-08-08: 40 meq via ORAL
  Filled 2024-08-08: qty 2

## 2024-08-08 MED ORDER — ENOXAPARIN SODIUM 30 MG/0.3ML IJ SOSY
30.0000 mg | PREFILLED_SYRINGE | INTRAMUSCULAR | Status: DC
  Administered 2024-08-08 – 2024-08-09 (×2): 30 mg via SUBCUTANEOUS
  Filled 2024-08-08 (×2): qty 0.3

## 2024-08-08 MED ORDER — ALBUTEROL SULFATE HFA 108 (90 BASE) MCG/ACT IN AERS: 2.0000 | INHALATION_SPRAY | RESPIRATORY_TRACT | Status: DC | PRN

## 2024-08-08 MED ORDER — LACTATED RINGERS IV SOLN: INTRAVENOUS | Status: DC

## 2024-08-08 MED ORDER — ALBUTEROL SULFATE (2.5 MG/3ML) 0.083% IN NEBU: 2.5000 mg | INHALATION_SOLUTION | RESPIRATORY_TRACT | Status: DC | PRN

## 2024-08-08 MED ORDER — MORPHINE SULFATE (PF) 2 MG/ML IV SOLN: 2.0000 mg | INTRAVENOUS | Status: DC | PRN

## 2024-08-08 NOTE — ED Notes (Signed)
 Patient attempted to give urine sample, had BM in sample hat.  Will attempt to get another sample at a later time. DO Hall made aware.

## 2024-08-08 NOTE — Progress Notes (Signed)
  Transition of Care [facility]) Screening Note   Patient Details  Name: Caitlin Tanner Date of Birth: 03-14-1951   Transition of Care [facility]) CM/SW Contact:    Hoy DELENA Bigness, LCSW Phone Number: 08/08/2024, 12:22 PM    Transition of Care Department [facility]) has reviewed patient and no TOC needs have been identified at this time. We will continue to monitor patient advancement through interdisciplinary progression rounds. If new patient transition needs arise, please place a TOC consult.    08/08/24 1222  TOC Brief Assessment  Insurance and Status Reviewed  Patient has primary care physician Yes  Home environment has been reviewed Home w/ spouse  Prior level of function: Independent  Prior/Current Home Services No current home services (Pt declined to have HH arranged.)  Social Drivers of Health Review SDOH reviewed no interventions necessary  Readmission risk has been reviewed Yes  Transition of care needs no transition of care needs at this time

## 2024-08-08 NOTE — Hospital Course (Addendum)
 Caitlin Tanner is a 73 y.o. female with medical history significant for vascular insufficiency, mixed anxiety and depression disorder, hyperlipidemia, hypertension, type 2 diabetes, and GERD, who presents to the ER from home due to nausea, vomiting, suprapubic pain, generalized weakness, and lower mid back pain for the past 1 day.  Symptoms started last night around 11:30 PM.  EMS was activated. X 2.     ER:: Temperature 98.6.  BP 127/60, pulse 111, respiratory rate 22, O2 saturation 95% RA  WBC: 16.2 K, lactic acid 2.0.  EDP concern for possible UTI.  Code sepsis called in the ER.  The patient received IV Cefepime and 1 L IV fluid bolus LR x 1.        Assessment & Plan:   Principal Problem: Sepsis (HCC) Lactic acidosis AKI      Sepsis secondary to possible UTI, POA. POA met sepsis criteria, currently hemodynamically stable - Current vitals: BP 127/64, P 86, temp. 98.1 F (36.7 C), RR 18,  SpO2 93%.RA  - UA and urine culture are pending. -  Continue cefepime empirically until an infective process is ruled out. Monitor fever curve and WBCs Lactic acid is 1.9 from 2.0.   AKI, prerenal in the setting of nausea, vomiting, and dehydration dehydration from poor oral intake. Baseline creatinine 0.9 with GFR of greater than 60 Presented with creatinine of 1.77 with GFR of 29 Lab Results  Component Value Date   CREATININE 1.65 (H) 08/08/2024   CREATININE 1.77 (H) 08/07/2024   CREATININE 1.06 (H) 12/02/2023   Avoid nephrotoxic agents, dehydration, and hypotension. Monitor urine output Repeat chemistry panel in the morning.   Elevated AST, nonspecific Monitor for now Repeat CMP in the morning   Hypokalemia Serum potassium 3.4, replating orally  hyperglycemia Presented with serum glucose of 164 Obtain hemoglobin A1c Heart healthy carb modified diet CBGs twice daily.   Lactic acidosis, multifactorial, secondary to infection, dehydration Responded to IV fluid  hydration, Lactic acid 2.0, repeat 1.9 post IV fluid hydration. Continue IV fluid hydration, LR 75 cc/h x 1 day.   Generalized weakness PT OT evaluation Fall precautions.

## 2024-08-08 NOTE — Progress Notes (Signed)
 PROGRESS NOTE    Patient: Caitlin Tanner                            PCP: Viktoria Lawrnce FORBES                    DOB: 1951/03/12            DOA: 08/07/2024 FMW:[id number]             DOS: 08/08/2024, 7:27 AM   LOS: 1 day   Date of Service: The patient was seen and examined on 08/08/2024  Subjective:   The patient was seen and examined this morning. Hemodynamically stable. No issues overnight .  Brief Narrative:   Caitlin Tanner is a 73 y.o. female with medical history significant for vascular insufficiency, mixed anxiety and depression disorder, hyperlipidemia, hypertension, type 2 diabetes, and GERD, who presents to the ER from home due to nausea, vomiting, suprapubic pain, generalized weakness, and lower mid back pain for the past 1 day.  Symptoms started last night around 11:30 PM.  EMS was activated. X 2.     ER:: Temperature 98.6.  BP 127/60, pulse 111, respiratory rate 22, O2 saturation 95% RA  WBC: 16.2 K, lactic acid 2.0.  EDP concern for possible UTI.  Code sepsis called in the ER.  The patient received IV Cefepime and 1 L IV fluid bolus LR x 1.        Assessment & Plan:   Principal Problem: Sepsis (HCC) Lactic acidosis AKI      Sepsis secondary to possible UTI, POA. POA met sepsis criteria, currently hemodynamically stable - Current vitals: BP 127/64, P 86, temp. 98.1 F (36.7 C), RR 18,  SpO2 93%.RA  - UA and urine culture are pending. -  Continue cefepime empirically until an infective process is ruled out. Monitor fever curve and WBCs Lactic acid is 1.9 from 2.0.   AKI, prerenal in the setting of nausea, vomiting, and dehydration dehydration from poor oral intake. Baseline creatinine 0.9 with GFR of greater than 60 Presented with creatinine of 1.77 with GFR of 29 Lab Results  Component Value Date   CREATININE 1.65 (H) 08/08/2024   CREATININE 1.77 (H) 08/07/2024   CREATININE 1.06 (H) 12/02/2023   Avoid nephrotoxic agents, dehydration, and  hypotension. Monitor urine output Repeat chemistry panel in the morning.   Elevated AST, nonspecific Monitor for now Repeat CMP in the morning   Hypokalemia Serum potassium 3.4, replating orally  hyperglycemia Presented with serum glucose of 164 Obtain hemoglobin A1c Heart healthy carb modified diet CBGs twice daily.   Lactic acidosis, multifactorial, secondary to infection, dehydration Responded to IV fluid hydration, Lactic acid 2.0, repeat 1.9 post IV fluid hydration. Continue IV fluid hydration, LR 75 cc/h x 1 day.   Generalized weakness PT OT evaluation Fall precautions.      ----------------------------------------------------------------------------------------------------------------------------------------------- Nutritional status:  The patient's BMI is: Body mass index is 26.33 kg/m. I agree with the assessment and plan as outlined   ---------------------------------------------------------------------------------------------------------------------------------------------------- Cultures; Blood Cultures x 2 >> Urine Culture  >>>  Sputum Culture >>   ------------------------------------------------------------------------------------------------------------------------------------------------  DVT prophylaxis:  enoxaparin (LOVENOX) injection 30 mg Start: 08/08/24 1000   Code Status:   Code Status: Full Code  Family Communication: No family member present at bedside-  -Advance care planning has been discussed.   Admission status:   Status is: Inpatient Remains inpatient appropriate because: Needing IV fluid, IV antibiotics  treating sepsis   Disposition: From  - home             Planning for discharge in 1-2 days   Procedures:   No admission procedures for hospital encounter.   Antimicrobials:  Anti-infectives (From admission, onward)    Start     Dose/Rate Route Frequency Ordered Stop   08/08/24 2200  ceFEPIme (MAXIPIME) 2 g in sodium  chloride 0.9 % 100 mL IVPB        2 g 200 mL/hr over 30 Minutes Intravenous Every 24 hours 08/08/24 0234     08/07/24 2145  ceFEPIme (MAXIPIME) 2 g in sodium chloride 0.9 % 100 mL IVPB        2 g 200 mL/hr over 30 Minutes Intravenous  Once 08/07/24 2139 08/07/24 2257        Medication:   enoxaparin (LOVENOX) injection  30 mg Subcutaneous Q24H   fluticasone  furoate-vilanterol  1 puff Inhalation Daily    acetaminophen, albuterol , melatonin, morphine injection, oxyCODONE, polyethylene glycol, prochlorperazine   Objective:   Vitals:   08/08/24 0515 08/08/24 0530 08/08/24 0545 08/08/24 0600  BP: 131/60 129/63 130/60 127/64  Pulse: 86 88 87 86  Resp: 19 (!) 21 17 18   Temp:      TempSrc:      SpO2: 93% 95% 95% 93%  Weight:      Height:        Intake/Output Summary (Last 24 hours) at 08/08/2024 0727 Last data filed at 08/08/2024 0110 Gross per 24 hour  Intake 2100.49 ml  Output --  Net 2100.49 ml   Filed Weights   08/07/24 1917  Weight: 74 kg     Physical examination:   General:  AAO x 3,  cooperative, no distress;   HEENT:  Normocephalic, PERRL, otherwise with in Normal limits   Neuro:  CNII-XII intact. , normal motor and sensation, reflexes intact   Lungs:   Clear to auscultation BL, Respirations unlabored,  No wheezes / crackles  Cardio:    S1/S2, RRR, No murmure, No Rubs or Gallops   Abdomen:  Soft, non-tender, bowel sounds active all four quadrants, no guarding or peritoneal signs.  Muscular  skeletal:  Limited exam -global generalized weaknesses - in bed, able to move all 4 extremities,   2+ pulses,  symmetric, No pitting edema  Skin:  Dry, warm to touch, negative for any Rashes,  Wounds: Please see nursing documentation       ------------------------------------------------------------------------------------------------------------------------------------------    LABs:     Latest Ref Rng & Units 08/08/2024    4:22 AM 08/07/2024    8:24 PM  12/02/2023    5:31 PM  CBC  WBC 4.0 - 10.5 K/uL 15.2  16.2  11.8   Hemoglobin 12.0 - 15.0 g/dL 87.8  86.8  86.6   Hematocrit 36.0 - 46.0 % 35.7  37.5  38.7   Platelets 150 - 400 K/uL 95  110  319       Latest Ref Rng & Units 08/08/2024    4:22 AM 08/07/2024    8:24 PM 12/02/2023    5:31 PM  CMP  Glucose 70 - 99 mg/dL 95  835  854   BUN 8 - 23 mg/dL 34  34  15   Creatinine 0.44 - 1.00 mg/dL 8.34  8.22  8.93   Sodium 135 - 145 mmol/L 141  137  139   Potassium 3.5 - 5.1 mmol/L 3.4  3.6  3.4   Chloride 98 -  111 mmol/L 103  99  98   CO2 22 - 32 mmol/L 28  23  27    Calcium 8.9 - 10.3 mg/dL 8.5  8.8  9.6   Total Protein 6.5 - 8.1 g/dL 6.3  6.7  8.2   Total Bilirubin 0.0 - 1.2 mg/dL 1.4  1.1  0.9   Alkaline Phos 38 - 126 U/L 100  103  77   AST 15 - 41 U/L 42  59  18   ALT 0 - 44 U/L 37  43  20        Micro Results Recent Results (from the past 240 hours)  Resp panel by RT-PCR (RSV, Flu A&B, Covid) Anterior Nasal Swab     Status: None   Collection Time: 08/07/24 10:07 PM   Specimen: Anterior Nasal Swab  Result Value Ref Range Status   SARS Coronavirus 2 by RT PCR NEGATIVE NEGATIVE Final    Comment: (NOTE) SARS-CoV-2 target nucleic acids are NOT DETECTED.  The SARS-CoV-2 RNA is generally detectable in upper respiratory specimens during the acute phase of infection. The lowest concentration of SARS-CoV-2 viral copies this assay can detect is 138 copies/mL. A negative result does not preclude SARS-Cov-2 infection and should not be used as the sole basis for treatment or other patient management decisions. A negative result may occur with  improper specimen collection/handling, submission of specimen other than nasopharyngeal swab, presence of viral mutation(s) within the areas targeted by this assay, and inadequate number of viral copies(<138 copies/mL). A negative result must be combined with clinical observations, patient history, and epidemiological information. The expected  result is Negative.  Fact Sheet for Patients:  bloggercourse.com  Fact Sheet for Healthcare Providers:  seriousbroker.it  This test is no t yet approved or cleared by the [place]  FDA and  has been authorized for detection and/or diagnosis of SARS-CoV-2 by FDA under an Emergency Use Authorization (EUA). This EUA will remain  in effect (meaning this test can be used) for the duration of the COVID-19 declaration under Section 564(b)(1) of the Act, 21 U.S.C.section 360bbb-3(b)(1), unless the authorization is terminated  or revoked sooner.       Influenza A by PCR NEGATIVE NEGATIVE Final   Influenza B by PCR NEGATIVE NEGATIVE Final    Comment: (NOTE) The Xpert Xpress SARS-CoV-2/FLU/RSV plus assay is intended as an aid in the diagnosis of influenza from Nasopharyngeal swab specimens and should not be used as a sole basis for treatment. Nasal washings and aspirates are unacceptable for Xpert Xpress SARS-CoV-2/FLU/RSV testing.  Fact Sheet for Patients: bloggercourse.com  Fact Sheet for Healthcare Providers: seriousbroker.it  This test is not yet approved or cleared by the [place]  FDA and has been authorized for detection and/or diagnosis of SARS-CoV-2 by FDA under an Emergency Use Authorization (EUA). This EUA will remain in effect (meaning this test can be used) for the duration of the COVID-19 declaration under Section 564(b)(1) of the Act, 21 U.S.C. section 360bbb-3(b)(1), unless the authorization is terminated or revoked.     Resp Syncytial Virus by PCR NEGATIVE NEGATIVE Final    Comment: (NOTE) Fact Sheet for Patients: bloggercourse.com  Fact Sheet for Healthcare Providers: seriousbroker.it  This test is not yet approved or cleared by the [place]  FDA and has been authorized for detection and/or diagnosis of  SARS-CoV-2 by FDA under an Emergency Use Authorization (EUA). This EUA will remain in effect (meaning this test can be used) for the duration of the COVID-19 declaration under Section  564(b)(1) of the Act, 21 U.S.C. section 360bbb-3(b)(1), unless the authorization is terminated or revoked.  Performed at [facility], [address], [place], [place] [address]     Radiology Reports DG Chest [place] 1 View Result Date: 08/07/2024 EXAM: 1 VIEW(S) XRAY OF THE CHEST 08/07/2024 09:57:09 PM COMPARISON: 12/02/2023 CLINICAL HISTORY: Questionable sepsis - evaluate for abnormality FINDINGS: LUNGS AND PLEURA: Mild bibasilar scarring, chronic. No pulmonary edema. No pleural effusion. No pneumothorax. HEART AND MEDIASTINUM: No acute abnormality of the cardiac and mediastinal silhouettes. BONES AND SOFT TISSUES: No acute osseous abnormality. IMPRESSION: 1. No acute cardiopulmonary process. Electronically signed by: Pinkie Pebbles MD 08/07/2024 09:59 PM EST RP Workstation: [id number]    SIGNED: Adriana DELENA Grams, MD, FHM. FAAFP. Jolynn Pack - [facility] Time spent - 55 min.  In seeing, evaluating and examining the patient. Reviewing medical records, labs, drawn plan of care. [facility],  Pager (please use [email or web address] to page/ text) Please use Epic Secure Chat for non-urgent communication (7AM-7PM)  If 7PM-7AM, please contact night-coverage [email or web address], 08/08/2024, 7:27 AM

## 2024-08-08 NOTE — Care Management CC44 (Signed)
 Condition Code 44 Documentation Completed  Patient Details  Name: Caitlin Tanner MRN: [id number] Date of Birth: 13-Jul-1951   Condition Code 44 given:  Yes Patient signature on Condition Code 44 notice:  Yes Documentation of 2 MD's agreement:  Yes Code 44 added to claim:  Yes    Hoy DELENA Bigness, LCSW 08/08/2024, 1:10 PM

## 2024-08-08 NOTE — Evaluation (Signed)
 Occupational Therapy Evaluation Patient Details Name: Caitlin Tanner MRN: [id number] DOB: [birth year] Today's Date: 08/08/2024   History of Present Illness   Caitlin Tanner is a 73 y.o. female with medical history significant for vascular insufficiency, mixed anxiety and depression disorder, hyperlipidemia, hypertension, type 2 diabetes, and GERD, who presents to the ER from home due to nausea, vomiting, suprapubic pain, generalized weakness, and lower mid back pain for the past 1 day.  Symptoms started last night around 11:30 PM.  EMS was activated.  When EMS arrived, vital signs were normal and patient was advised to follow-up with PCP.  However the patient's symptoms recurred and were more severe.  Her husband brought her to the ER today for further evaluation.  Endorses chills but no fevers. (per DO)     Clinical Impressions Pt agreeable to OT and PT co-evaluation. Pt is independent at baseline without use of RW. Today pt required RW due to mild LOB when initially functionally ambulating and general weakness. Pt demonstrated WFL B UE strength and was mostly independent for ADL's other than for standing tasks due to mild instability. Deferring to PT for work on balance and ambulation. Pt left in the bed with family present. Pt is not recommended for any further acute OT services and will be discharged to care of nursing staff for remaining length of stay.       If plan is discharge home, recommend the following:   A little help with walking and/or transfers;Assist for transportation;Help with stairs or ramp for entrance     Functional Status Assessment   Patient has not had a recent decline in their functional status     Equipment Recommendations   None recommended by OT      Precautions/Restrictions   Precautions Precautions: Fall Recall of Precautions/Restrictions: Intact Restrictions Weight Bearing Restrictions Per Provider Order: No     Mobility Bed  Mobility Overal bed mobility: Modified Independent             General bed mobility comments: HOB elevated    Transfers Overall transfer level: Needs assistance   Transfers: Sit to/from Stand Sit to Stand: Supervision, Modified independent (Device/Increase time)           General transfer comment: Sit to stand from EOB followed by ambulation with one brief lob without AD leading to use of RW the rest of the way.      Balance Overall balance assessment: Needs assistance Sitting-balance support: No upper extremity supported, Feet supported Sitting balance-Leahy Scale: Good Sitting balance - Comments: seated at EOB   Standing balance support: No upper extremity supported, During functional activity Standing balance-Leahy Scale: Fair Standing balance comment: mild LOB when ambulating beside the bed.                           ADL either performed or assessed with clinical judgement   ADL Overall ADL's : Needs assistance/impaired     Grooming: Modified independent;Supervision/safety;Standing       Lower Body Bathing: Modified independent;Sitting/lateral leans       Lower Body Dressing: Modified independent;Sitting/lateral leans   Toilet Transfer: Modified Independent;Supervision/safety;Rolling walker (2 wheels);Ambulation Toilet Transfer Details (indicate cue type and reason): Per ambulation in the hall with RW. Toileting- Clothing Manipulation and Hygiene: Modified independent;Sitting/lateral lean       Functional mobility during ADLs: Modified independent;Supervision/safety;Rolling walker (2 wheels) General ADL Comments: Pt ambulated in the hall with RW and supervision to mod  I assist.     Vision Baseline Vision/History: 1 Wears glasses (Gets shots in R eye.) Ability to See in Adequate Light: 1 Impaired Patient Visual Report: No change from baseline Vision Assessment?: Wears glasses for reading     Perception Perception: Not tested        Praxis Praxis: Not tested       Pertinent Vitals/Pain Pain Assessment Pain Assessment: No/denies pain     Extremity/Trunk Assessment Upper Extremity Assessment Upper Extremity Assessment: Overall WFL for tasks assessed   Lower Extremity Assessment Lower Extremity Assessment: Defer to PT evaluation   Cervical / Trunk Assessment Cervical / Trunk Assessment: Normal   Communication Communication Communication: No apparent difficulties   Cognition Arousal: Alert Behavior During Therapy: WFL for tasks assessed/performed Cognition: No apparent impairments                               Following commands: Intact       Cueing  General Comments   Cueing Techniques: Verbal cues                 Home Living Family/patient expects to be discharged to:: Private residence Living Arrangements: Spouse/significant other Available Help at Discharge: Family;Available 24 hours/day Type of Home: Mobile home Home Access: Stairs to enter Entrance Stairs-Number of Steps: 3 Entrance Stairs-Rails: None Home Layout: One level     Bathroom Shower/Tub: Producer, Television/film/video: Standard Bathroom Accessibility: Yes How Accessible: Accessible via walker Home Equipment: Rolling Walker (2 wheels);Shower seat (Husbands old RW)          Prior Functioning/Environment Prior Level of Function : Independent/Modified Independent             Mobility Comments: Community ambulator without AD; drives. ADLs Comments: Independent                            Co-evaluation PT/OT/SLP Co-Evaluation/Treatment: Yes Reason for Co-Treatment: To address functional/ADL transfers   OT goals addressed during session: ADL's and self-care                       End of Session Equipment Utilized During Treatment: Rolling walker (2 wheels)  Activity Tolerance: Patient tolerated treatment well Patient left: in bed;with call bell/phone within reach;with  family/visitor present  OT Visit Diagnosis: Unsteadiness on feet (R26.81);Other abnormalities of gait and mobility (R26.89);Muscle weakness (generalized) (M62.81)                Time: 9058-9046 OT Time Calculation (min): 12 min Charges:  OT General Charges $OT Visit: 1 Visit OT Evaluation $OT Eval Low Complexity: 1 Low  Rex Oesterle OT, MOT  Jayson Person 08/08/2024, 11:16 AM

## 2024-08-08 NOTE — Care Management Obs Status (Signed)
 MEDICARE OBSERVATION STATUS NOTIFICATION   Patient Details  Name: Caitlin Tanner MRN: [id number] Date of Birth: 10/22/1950   Medicare Observation Status Notification Given:  Yes    Hoy DELENA Bigness, LCSW 08/08/2024, 1:09 PM

## 2024-08-08 NOTE — Evaluation (Signed)
 Physical Therapy Evaluation Patient Details Name: Caitlin Tanner MRN: [id number] DOB: [birth year] Today's Date: 08/08/2024  History of Present Illness  Caitlin Tanner is a 73 y.o. female with medical history significant for vascular insufficiency, mixed anxiety and depression disorder, hyperlipidemia, hypertension, type 2 diabetes, and GERD, who presents to the ER from home due to nausea, vomiting, suprapubic pain, generalized weakness, and lower mid back pain for the past 1 day.  Symptoms started last night around 11:30 PM.  EMS was activated.  When EMS arrived, vital signs were normal and patient was advised to follow-up with PCP.  However the patient's symptoms recurred and were more severe.  Her husband brought her to the ER today for further evaluation.  Endorses chills but no fevers.   Clinical Impression  Patient demonstrates good return for getting into/out of bed, ambulated short distances without AD, but required use of RW due to fair/poor standing balance and c/o generalized weakness. Patient ambulated safely using RW without loss of balance and put back to bed after therapy. Patient will benefit from continued skilled physical therapy in hospital and recommended venue below to increase strength, balance, endurance for safe ADLs and gait.         If plan is discharge home, recommend the following: A little help with walking and/or transfers;A little help with bathing/dressing/bathroom;Help with stairs or ramp for entrance;Assistance with cooking/housework;Assist for transportation   Can travel by private vehicle        Equipment Recommendations Rolling walker (2 wheels)  Recommendations for Other Services       Functional Status Assessment Patient has had a recent decline in their functional status and demonstrates the ability to make significant improvements in function in a reasonable and predictable amount of time.     Precautions / Restrictions Precautions Precautions:  Fall Recall of Precautions/Restrictions: Intact Restrictions Weight Bearing Restrictions Per Provider Order: No      Mobility  Bed Mobility Overal bed mobility: Modified Independent                  Transfers Overall transfer level: Needs assistance   Transfers: Sit to/from Stand, Bed to chair/wheelchair/BSC Sit to Stand: Supervision, Modified independent (Device/Increase time)   Step pivot transfers: Supervision       General transfer comment: slightly labored movement    Ambulation/Gait Ambulation/Gait assistance: Supervision, Contact guard assist Gait Distance (Feet): 60 Feet Assistive device: Rolling walker (2 wheels) Gait Pattern/deviations: Decreased step length - right, Decreased step length - left, Decreased stride length Gait velocity: decreased     General Gait Details: labored movement, tolerated ambulation without AD for 20 feet before requesting use of RW due to weakness and tolerated ambulating in hallway without loss of balance, limited mostly due to fatigue  Stairs            Wheelchair Mobility     Tilt Bed    Modified Rankin (Stroke Patients Only)       Balance Overall balance assessment: Needs assistance Sitting-balance support: Feet supported, No upper extremity supported Sitting balance-Leahy Scale: Good Sitting balance - Comments: seated at EOB   Standing balance support: No upper extremity supported, During functional activity Standing balance-Leahy Scale: Fair Standing balance comment: fair/good using RW                             Pertinent Vitals/Pain Pain Assessment Pain Assessment: No/denies pain    Home Living Family/patient expects to  be discharged to:: Private residence Living Arrangements: Spouse/significant other Available Help at Discharge: Family;Available 24 hours/day Type of Home: Mobile home Home Access: Stairs to enter Entrance Stairs-Rails: None Entrance Stairs-Number of Steps: 3    Home Layout: One level Home Equipment: Agricultural Consultant (2 wheels);Shower seat      Prior Function Prior Level of Function : Independent/Modified Independent             Mobility Comments: Community ambulator without AD; drives. ADLs Comments: Independent     Extremity/Trunk Assessment   Upper Extremity Assessment Upper Extremity Assessment: Defer to OT evaluation    Lower Extremity Assessment Lower Extremity Assessment: Generalized weakness    Cervical / Trunk Assessment Cervical / Trunk Assessment: Normal  Communication   Communication Communication: No apparent difficulties    Cognition Arousal: Alert Behavior During Therapy: WFL for tasks assessed/performed   PT - Cognitive impairments: No apparent impairments                         Following commands: Intact       Cueing Cueing Techniques: Verbal cues     General Comments      Exercises     Assessment/Plan    PT Assessment Patient needs continued PT services  PT Problem List Decreased strength;Decreased activity tolerance;Decreased balance;Decreased mobility       PT Treatment Interventions DME instruction;Gait training;Stair training;Functional mobility training;Therapeutic activities;Therapeutic exercise;Balance training;Patient/family education    PT Goals (Current goals can be found in the Care Plan section)  Acute Rehab PT Goals Patient Stated Goal: return home with family to assist PT Goal Formulation: With patient/family Time For Goal Achievement: 08/12/24 Potential to Achieve Goals: Good    Frequency Min 3X/week     Co-evaluation PT/OT/SLP Co-Evaluation/Treatment: Yes Reason for Co-Treatment: To address functional/ADL transfers PT goals addressed during session: Mobility/safety with mobility;Balance;Proper use of DME OT goals addressed during session: ADL's and self-care       AM-PAC PT 6 Clicks Mobility  Outcome Measure Help needed turning from your back to  your side while in a flat bed without using bedrails?: None Help needed moving from lying on your back to sitting on the side of a flat bed without using bedrails?: A Little Help needed moving to and from a bed to a chair (including a wheelchair)?: A Little Help needed standing up from a chair using your arms (e.g., wheelchair or bedside chair)?: A Little Help needed to walk in hospital room?: A Little Help needed climbing 3-5 steps with a railing? : A Little 6 Click Score: 19    End of Session   Activity Tolerance: Patient tolerated treatment well;Patient limited by fatigue Patient left: in bed;with call bell/phone within reach;with family/visitor present Nurse Communication: Mobility status PT Visit Diagnosis: Unsteadiness on feet (R26.81);Other abnormalities of gait and mobility (R26.89);Muscle weakness (generalized) (M62.81)    Time: 9060-9047 PT Time Calculation (min) (ACUTE ONLY): 13 min   Charges:   PT Evaluation $PT Eval Low Complexity: 1 Low PT Treatments $Gait Training: 8-22 mins PT General Charges $$ ACUTE PT VISIT: 1 Visit         2:34 PM, 08/08/24 Lynwood Music, MPT Physical Therapist with [facility] 336 [phone] office [phone] mobile phone

## 2024-08-08 NOTE — Plan of Care (Signed)
  Problem: Acute Rehab PT Goals(only PT should resolve) Goal: Pt Will Go Supine/Side To Sit Outcome: Progressing Flowsheets (Taken 08/08/2024 1435) Pt will go Supine/Side to Sit:  Independently  with modified independence Goal: Patient Will Transfer Sit To/From Stand Outcome: Progressing Flowsheets (Taken 08/08/2024 1435) Patient will transfer sit to/from stand: with modified independence Goal: Pt Will Transfer Bed To Chair/Chair To Bed Outcome: Progressing Flowsheets (Taken 08/08/2024 1435) Pt will Transfer Bed to Chair/Chair to Bed: with modified independence Goal: Pt Will Ambulate Outcome: Progressing Flowsheets (Taken 08/08/2024 1435) Pt will Ambulate:  100 feet  with modified independence  with rolling walker  with least restrictive assistive device   2:35 PM, 08/08/24 Lynwood Music, MPT Physical Therapist with [facility] 336 [phone] office [phone] mobile phone

## 2024-08-08 NOTE — H&P (Addendum)
 History and Physical  Caitlin Tanner FMW:[id number] DOB: 02-03-1951 DOA: 08/07/2024  Referring physician: Dr. Franklyn, EDP  PCP: Viktoria Clunes E  Outpatient Specialists: Pulmonary, GI. Patient coming from: Home.  Chief Complaint: Generalized weakness, nausea, vomiting, chills.  HPI: Caitlin Tanner is a 73 y.o. female with medical history significant for vascular insufficiency, mixed anxiety and depression disorder, hyperlipidemia, hypertension, type 2 diabetes, and GERD, who presents to the ER from home due to nausea, vomiting, suprapubic pain, generalized weakness, and lower mid back pain for the past 1 day.  Symptoms started last night around 11:30 PM.  EMS was activated.  When EMS arrived, vital signs were normal and patient was advised to follow-up with PCP.  However the patient's symptoms recurred and were more severe.  Her husband brought her to the ER today for further evaluation.  Endorses chills but no fevers.  In the ER, tachycardic and tachypneic.  Lab studies notable for leukocytosis 16.2 K, lactic acid 2.0.  EDP concern for possible UTI.  Code sepsis called in the ER.  The patient received IV cefepime and 1 L IV fluid bolus LR x 1.  TRH, hospitalist service, was asked to admit for sepsis.  ED Course: Temperature 98.6.  BP 127/60, pulse 111, respiratory rate 22, O2 saturation 95% on room air.  Review of Systems: Review of systems as noted in the HPI. All other systems reviewed and are negative.   Past Medical History:  Diagnosis Date   Anxiety disorder    Asthma    Borderline diabetes    Diverticulosis    GERD (gastroesophageal reflux disease)    Hemorrhoids    Hypertension    IBS (irritable bowel syndrome)    S/P colonoscopy 12/23/2010   Dr Rourk-diverticulosis, anal canal hemorhhoids, anal papilla, random bx benign   S/P endoscopy 12/23/2010   Dr Vic, tubular esophagus, sm HH, minimal retained food, bile-stained mucus, otherwise normal,, esophageal bx  benign    Past Surgical History:  Procedure Laterality Date   CHOLECYSTECTOMY     cold knife conization     COLONOSCOPY  06/08/2006   internal hemorrhoids otherwise noraml colon and rectum   COLONOSCOPY  11/2010   Dr. Shaaron: Anal canal hemorrhoids, diverticulosis, random colon biopsies unremarkable.   COLONOSCOPY WITH PROPOFOL  N/A 12/15/2019   Procedure: COLONOSCOPY WITH PROPOFOL ;  Surgeon: Shaaron Lamar HERO, MD;  [facility];  Service: Endoscopy;  Laterality: N/A;  9:00am   COLONOSCOPY WITH PROPOFOL  N/A 01/01/2023   Procedure: COLONOSCOPY WITH PROPOFOL ;  Surgeon: Shaaron Lamar HERO, MD;  Location: [facility];  Service: Endoscopy;  Laterality: N/A;  12:45 am   egd/tcs  11/2010   see PMH   ESOPHAGOGASTRODUODENOSCOPY  06/08/2006   single distal erosion otherwise normal   ESOPHAGOGASTRODUODENOSCOPY  11/2010   Dr. Shaaron: Longitudinal for reading of the tubular esophagus, no evidence of eosinophilic esophagitis on biopsy, small hiatal hernia, minimal amount of retained food, bile-stained mucosa.   ESOPHAGOGASTRODUODENOSCOPY (EGD) WITH PROPOFOL  N/A 12/15/2019   Procedure: ESOPHAGOGASTRODUODENOSCOPY (EGD) WITH PROPOFOL ;  Surgeon: Shaaron Lamar HERO, MD;  Location: [facility];  Service: Endoscopy;  Laterality: N/A;   left great toe surgery     POLYPECTOMY  12/15/2019   Procedure: POLYPECTOMY;  Surgeon: Shaaron Lamar HERO, MD;  Location: [facility];  Service: Endoscopy;;  colon   POLYPECTOMY  01/01/2023   Procedure: POLYPECTOMY;  Surgeon: Shaaron Lamar HERO, MD;  Location: [facility];  Service: Endoscopy;;   TONSILLECTOMY  WRIST FRACTURE SURGERY  07/2013   right    Social History:  reports that she quit smoking about 36 years ago. Her smoking use included cigarettes. She started smoking about 39 years ago. She has a 0.3 pack-year smoking history. She has never used smokeless tobacco. She reports that she does not drink alcohol and does not use drugs.   Allergies  Allergen Reactions   Conj  Estrog-Medroxyprogest Ace     REACTION: UNKNOWN REACTION   Ketorolac Tromethamine     Felt like she had bugs in her head   Sulfa Antibiotics Nausea And Vomiting   Tramadol Hcl     Other Reaction(s): Not available    Family History  Problem Relation Age of Onset   Stomach cancer Father 76   Throat cancer Mother 39   Diabetes Brother    Transient ischemic attack Brother    Colon cancer Neg Hx    Allergic rhinitis Neg Hx    Angioedema Neg Hx    Asthma Neg Hx    Atopy Neg Hx    Eczema Neg Hx    Immunodeficiency Neg Hx    Urticaria Neg Hx       Prior to Admission medications   Medication Sig Start Date End Date Taking? Authorizing Provider  albuterol  (VENTOLIN  HFA) 108 (90 Base) MCG/ACT inhaler Inhale 2 puffs into the lungs every 4 (four) hours as needed for wheezing or shortness of breath. 07/31/23   Iva Marty Saltness, MD  aspirin 81 MG EC tablet Take 81 mg by mouth daily. 09/08/17   [provider]  Azelastine -Fluticasone  (DYMISTA ) 137-50 MCG/ACT SUSP Place 2 sprays into the nose 2 (two) times daily. 01/29/24   Iva Marty Saltness, MD  bismuth subsalicylate (PEPTO BISMOL) 262 MG/15ML suspension Take 30 mLs by mouth daily as needed for indigestion or diarrhea or loose stools.     [provider]  cetirizine  (ZYRTEC ) 10 MG tablet Take 1 tablet (10 mg total) by mouth daily. 01/29/24   Iva Marty Saltness, MD  Cholecalciferol (VITAMIN D3) 25 MCG (1000 UT) CAPS Take 2,000 Units by mouth daily.     [provider]  Cranberry-Vitamin C-Probiotic (AZO CRANBERRY PO) Take 1 tablet by mouth in the morning and at bedtime.    [provider]  escitalopram (LEXAPRO) 10 MG tablet Take 10 mg by mouth daily.     [provider]  ezetimibe (ZETIA) 10 MG tablet Take 10 mg by mouth daily.    [provider]  famotidine  (PEPCID ) 40 MG tablet Take 1 tablet (40 mg total) by mouth daily. 07/31/23   Iva Marty Saltness, MD  fluticasone -salmeterol  (ADVAIR  HFA) 230-21 MCG/ACT inhaler Inhale 2 puffs into the lungs 2 (two) times daily. 01/29/24   Iva Marty Saltness, MD  GARLIC PO Take 1 capsule by mouth daily. 03/31/20   [provider]  loratadine  (CLARITIN ) 10 MG tablet Take 1 tablet (10 mg total) by mouth daily as needed for allergies (Can take an extra dose during flare ups.). 01/29/24   Iva Marty Saltness, MD  LORazepam (ATIVAN) 0.5 MG tablet Take 0.5 mg by mouth daily as needed for anxiety.     [provider]  losartan-hydrochlorothiazide (HYZAAR) 50-12.5 MG per tablet Take 1 tablet by mouth daily.    [provider]  meloxicam (MOBIC) 7.5 MG tablet Take 7.5 mg by mouth 2 (two) times daily. 06/04/22   [provider]  metFORMIN (GLUCOPHAGE) 500 MG tablet Take 500 mg by mouth See  admin instructions. 500 mg in the morning if blood sugar is above 140, 500 mg every evening 07/15/21   [provider]  metoprolol (TOPROL-XL) 50 MG 24 hr tablet Take 50 mg by mouth daily.    [provider]  Multiple Vitamin (MULTIVITAMIN) capsule Take 1 capsule by mouth daily.    [provider]  Multiple Vitamins-Minerals (PRESERVISION AREDS 2) CAPS Take 1 capsule by mouth in the morning and at bedtime.    [provider]  mupirocin  ointment (BACTROBAN ) 2 % Apply into each nostril twice daily to help heal the lesion. 01/29/24   Iva Marty Saltness, MD  Omega-3 Fatty Acids (FISH OIL) 1000 MG CAPS Take 1,000 mg by mouth daily.    [provider]  pantoprazole  (PROTONIX ) 40 MG tablet Take 1 tablet (40 mg total) by mouth daily. 07/31/23   Iva Marty Saltness, MD  Spacer/Aero-Holding Raguel DEVI 1 Device by Does not apply route as directed. 03/27/23   Iva Marty Saltness, MD  tretinoin (RETIN-A) 0.05 % cream Apply 1 application  topically at bedtime. 07/18/21   [provider]  triamcinolone cream (KENALOG) 0.1 % SMARTSIG:1 Application Topical 2-3 Times Daily 01/28/24   [provider]    Physical Exam: BP (!) 107/57   Pulse 81   Temp 98.6 F (37 C) (Oral)   Resp (!) 22   Wt 74 kg   SpO2 93%   BMI 26.38 kg/m   General: 73 y.o. year-old female well developed well nourished in no acute distress.  Alert and oriented x3. Cardiovascular: Tachycardic with no rubs or gallops.  No thyromegaly or JVD noted.  No lower extremity edema. 2/4 pulses in all 4 extremities. Respiratory: Clear to auscultation with no wheezes or rales. Good inspiratory effort. Abdomen: Soft suprapubic tenderness nondistended with normal bowel sounds x4 quadrants. Muskuloskeletal: No cyanosis, clubbing or edema noted bilaterally Neuro: CN II-XII intact, strength, sensation, reflexes Skin: No ulcerative lesions noted or rashes Psychiatry: Judgement and insight appear normal. Mood is appropriate for condition and setting          Labs on Admission:  Basic Metabolic Panel: Recent Labs  Lab 08/07/24 2024  NA 137  K 3.6  CL 99  CO2 23  GLUCOSE 164*  BUN 34*  CREATININE 1.77*  CALCIUM 8.8*   Liver Function Tests: Recent Labs  Lab 08/07/24 2024  AST 59*  ALT 43  ALKPHOS 103  BILITOT 1.1  PROT 6.7  ALBUMIN 4.0   No results for input(s): LIPASE, AMYLASE in the last 168 hours. No results for input(s): AMMONIA in the last 168 hours. CBC: Recent Labs  Lab 08/07/24 2024  WBC 16.2*  HGB 13.1  HCT 37.5  MCV 90.4  PLT 110*   Cardiac Enzymes: No results for input(s): CKTOTAL, CKMB, CKMBINDEX, TROPONINI in the last 168 hours.  BNP (last 3 results) Recent Labs    12/02/23 1731  BNP 30.0    ProBNP (last 3 results) No results for input(s): PROBNP in the last 8760 hours.  CBG: No results for input(s): GLUCAP in the last 168 hours.  Radiological Exams on Admission: DG Chest Port 1 View Result Date: 08/07/2024 EXAM: 1 VIEW(S) XRAY OF THE CHEST 08/07/2024 09:57:09 PM COMPARISON: 12/02/2023 CLINICAL HISTORY: Questionable sepsis - evaluate for  abnormality FINDINGS: LUNGS AND PLEURA: Mild bibasilar scarring, chronic. No pulmonary edema. No pleural effusion. No pneumothorax. HEART AND MEDIASTINUM: No acute abnormality of the cardiac and mediastinal silhouettes. BONES AND SOFT TISSUES: No acute osseous  abnormality. IMPRESSION: 1. No acute cardiopulmonary process. Electronically signed by: Pinkie Pebbles MD 08/07/2024 09:59 PM EST RP Workstation: [id number]    EKG: I independently viewed the EKG done and my findings are as followed: Sinus rhythm rate of 89.  Nonspecific ST changes.  QTc 492.  Assessment/Plan Present on Admission:  Sepsis (HCC)  Principal Problem:   Sepsis (HCC)  Sepsis secondary to possible UTI, POA. UA and urine culture are pending. Follow urine culture for ID and sensitivities Continue cefepime empirically until an infective process is ruled out. Monitor fever curve and WBCs Maintain MAP greater than 65 Lactic acid is 1.9 from 2.0.  AKI, prerenal in the setting of nausea, vomiting, and dehydration dehydration from poor oral intake. Baseline creatinine 0.9 with GFR of greater than 60 Presented with creatinine of 1.77 with GFR of 29 Avoid nephrotoxic agents, dehydration, and hypotension. Monitor urine output Repeat chemistry panel in the morning.  Elevated AST, nonspecific Monitor for now Repeat CMP in the morning  Hyperglycemia Presented with serum glucose of 164 Obtain hemoglobin A1c Heart healthy carb modified diet CBGs twice daily.  Elevated lactic acid, multifactorial, secondary to infection, dehydration Lactic acid 2.0, repeat 1.9 post IV fluid hydration. Continue IV fluid hydration, LR 75 cc/h x 1 day.  Generalized weakness PT OT evaluation Fall precautions.   Critical care time: 55 minutes.   DVT prophylaxis: Subcu Lovenox daily.  Code Status: Full code.  Family Communication: Husband at bedside.  Disposition Plan: Admitted to telemetry unit.  Consults called:  None.  Admission status: Inpatient status.   Status is: Inpatient The patient requires at least 2 midnights for further evaluation and treatment of present condition.   Terry LOISE Hurst MD [facility] Pager [phone]  If 7PM-7AM, please contact night-coverage [email or web address] Password [email or web address]  08/08/2024, 2:28 AM

## 2024-08-09 DIAGNOSIS — A419 Sepsis, unspecified organism: Secondary | ICD-10-CM | POA: Diagnosis not present

## 2024-08-09 DIAGNOSIS — N390 Urinary tract infection, site not specified: Secondary | ICD-10-CM

## 2024-08-09 LAB — URINALYSIS, W/ REFLEX TO CULTURE (INFECTION SUSPECTED)
Bacteria, UA: NONE SEEN
Bilirubin Urine: NEGATIVE
Glucose, UA: NEGATIVE mg/dL
Hgb urine dipstick: NEGATIVE
Ketones, ur: NEGATIVE mg/dL
Leukocytes,Ua: NEGATIVE
Nitrite: NEGATIVE
Protein, ur: NEGATIVE mg/dL
Specific Gravity, Urine: 1.011 (ref 1.005–1.030)
pH: 8 (ref 5.0–8.0)

## 2024-08-09 LAB — COMPREHENSIVE METABOLIC PANEL WITH GFR
ALT: 25 U/L (ref 0–44)
AST: 27 U/L (ref 15–41)
Albumin: 3.2 g/dL — ABNORMAL LOW (ref 3.5–5.0)
Alkaline Phosphatase: 111 U/L (ref 38–126)
Anion gap: 9 (ref 5–15)
BUN: 24 mg/dL — ABNORMAL HIGH (ref 8–23)
CO2: 24 mmol/L (ref 22–32)
Calcium: 8.3 mg/dL — ABNORMAL LOW (ref 8.9–10.3)
Chloride: 105 mmol/L (ref 98–111)
Creatinine, Ser: 0.9 mg/dL (ref 0.44–1.00)
GFR, Estimated: >60 mL/min (ref >60)
Glucose, Bld: 102 mg/dL — ABNORMAL HIGH (ref 70–99)
Potassium: 4.1 mmol/L (ref 3.5–5.1)
Sodium: 138 mmol/L (ref 135–145)
Total Bilirubin: 0.9 mg/dL (ref 0.0–1.2)
Total Protein: 5.8 g/dL — ABNORMAL LOW (ref 6.5–8.1)

## 2024-08-09 LAB — GLUCOSE, CAPILLARY
Glucose-Capillary: 112 mg/dL — ABNORMAL HIGH (ref 70–99)
Glucose-Capillary: 113 mg/dL — ABNORMAL HIGH (ref 70–99)
Glucose-Capillary: 115 mg/dL — ABNORMAL HIGH (ref 70–99)

## 2024-08-09 LAB — CBC
HCT: 34.9 % — ABNORMAL LOW (ref 36.0–46.0)
Hemoglobin: 11.6 g/dL — ABNORMAL LOW (ref 12.0–15.0)
MCH: 31.2 pg (ref 26.0–34.0)
MCHC: 33.2 g/dL (ref 30.0–36.0)
MCV: 93.8 fL (ref 80.0–100.0)
Platelets: 89 K/uL — ABNORMAL LOW (ref 150–400)
RBC: 3.72 MIL/uL — ABNORMAL LOW (ref 3.87–5.11)
RDW: 13.3 % (ref 11.5–15.5)
WBC: 11.1 K/uL — ABNORMAL HIGH (ref 4.0–10.5)
nRBC: 0 % (ref 0.0–0.2)

## 2024-08-09 MED ORDER — ENOXAPARIN SODIUM 40 MG/0.4ML IJ SOSY
40.0000 mg | PREFILLED_SYRINGE | INTRAMUSCULAR | Status: DC
  Administered 2024-08-10: 40 mg via SUBCUTANEOUS
  Filled 2024-08-09: qty 0.4

## 2024-08-09 MED ORDER — SODIUM CHLORIDE 0.9 % IV SOLN
1.0000 g | INTRAVENOUS | Status: DC
  Administered 2024-08-09 – 2024-08-10 (×2): 1 g via INTRAVENOUS
  Filled 2024-08-09 (×2): qty 10

## 2024-08-09 NOTE — Progress Notes (Signed)
 PROGRESS NOTE    Patient: Caitlin Tanner                            PCP: Viktoria Lawrnce FORBES                    DOB: 23-Dec-1950            DOA: 08/07/2024 FMW:[id number]             DOS: 08/09/2024, 12:05 PM   LOS: 1 day   Date of Service: The patient was seen and examined on 08/09/2024  Subjective:   The patient was seen and examined this morning. Hypertensive otherwise hemodynamically stable   Brief Narrative:   Caitlin Tanner is a 73 y.o. female with medical history significant for vascular insufficiency, mixed anxiety and depression disorder, hyperlipidemia, hypertension, type 2 diabetes, and GERD, who presents to the ER from home due to nausea, vomiting, suprapubic pain, generalized weakness, and lower mid back pain for the past 1 day.  Symptoms started last night around 11:30 PM.  EMS was activated. X 2.     ER:: Temperature 98.6.  BP 127/60, pulse 111, respiratory rate 22, O2 saturation 95% RA  WBC: 16.2 K, lactic acid 2.0.  EDP concern for possible UTI.  Code sepsis called in the ER.  The patient received IV Cefepime and 1 L IV fluid bolus LR x 1.        Assessment & Plan:   Principal Problem:  Sepsis (HCC)  Lactic acidosis  AKI      Sepsis secondary to possible UTI, POA. -Much improved sepsis physiology, hemodynamically stable, hypertensive this a.m.  POA met sepsis criteria, currently hemodynamically stable   - UA and urine culture are pending. - On cefepime>>>> de-escalating antibiotics today. Monitor fever curve and WBCs Lactic acid is 1.9 from 2.0.>> 1.9    AKI, prerenal in the setting of nausea, vomiting, and dehydration dehydration from poor oral intake. Baseline creatinine 0.9 with GFR of greater than 60 Presented with creatinine of 1.77 with GFR of 29 Lab Results  Component Value Date   CREATININE 0.90 08/09/2024   CREATININE 1.65 (H) 08/08/2024   CREATININE 1.77 (H) 08/07/2024   -Much improved-hydration   Avoid nephrotoxic agents, dehydration,  and hypotension. Monitor urine output    Elevated AST, nonspecific Resolved Monitor for now-AST 42 >> 2   Hypokalemia -repleted, monitor  Hyperglycemia -prediabetic POA 164 105, 95, 112  A1c: 6.2 - Carb modified diet    Lactic acidosis, multifactorial, secondary to infection, dehydration Resolved- Will continue to monitor, continue gentle IV fluid hydration   Generalized weakness PT OT evaluation Fall precautions.      ----------------------------------------------------------------------------------------------------------------------------------------------- Nutritional status:  The patient's BMI is: Body mass index is 27.12 kg/m. I agree with the assessment and plan as outlined   ---------------------------------------------------------------------------------------------------------------------------------------------------- Cultures; Blood Cultures x 2 >> no growth to date Urine Culture  >>> no growth to date  Antibiotics IV cefepime>>> Rocephin 11/11  ------------------------------------------------------------------------------------------------------------------------------------------------  DVT prophylaxis:  enoxaparin (LOVENOX) injection 40 mg Start: 08/10/24 1000   Code Status:   Code Status: Full Code  Family Communication: No family member present at bedside-  -Advance care planning has been discussed.   Admission status:   Status is: Inpatient Remains inpatient appropriate because: Needing IV fluid, IV antibiotics treating sepsis   Disposition: From  - home             Planning  for discharge in 1-2 days   Procedures:   No admission procedures for hospital encounter.   Antimicrobials:  Anti-infectives (From admission, onward)    Start     Dose/Rate Route Frequency Ordered Stop   08/09/24 1200  cefTRIAXone (ROCEPHIN) 1 g in sodium chloride 0.9 % 100 mL IVPB        1 g 200 mL/hr over 30 Minutes Intravenous Every 24 hours 08/09/24 1051      08/08/24 2200  ceFEPIme (MAXIPIME) 2 g in sodium chloride 0.9 % 100 mL IVPB  Status:  Discontinued        2 g 200 mL/hr over 30 Minutes Intravenous Every 24 hours 08/08/24 0234 08/09/24 1051   08/07/24 2145  ceFEPIme (MAXIPIME) 2 g in sodium chloride 0.9 % 100 mL IVPB        2 g 200 mL/hr over 30 Minutes Intravenous  Once 08/07/24 2139 08/07/24 2257        Medication:   [START ON 08/10/2024] enoxaparin (LOVENOX) injection  40 mg Subcutaneous Q24H   fluticasone  furoate-vilanterol  1 puff Inhalation Daily   loratadine   10 mg Oral Daily    acetaminophen, albuterol , melatonin, morphine injection, oxyCODONE, polyethylene glycol, prochlorperazine   Objective:   Vitals:   08/08/24 2037 08/08/24 2359 08/09/24 0511 08/09/24 0836  BP: (!) 148/75 (!) 151/79 (!) 157/81   Pulse: 96 95 92   Resp: 16 18 19    Temp: 98.3 F (36.8 C) 97.8 F (36.6 C) 98.2 F (36.8 C)   TempSrc: Oral Oral Oral   SpO2: 95% 95% 96% 93%  Weight:      Height:        Intake/Output Summary (Last 24 hours) at 08/09/2024 1205 Last data filed at 08/09/2024 0945 Gross per 24 hour  Intake 240 ml  Output --  Net 240 ml   Filed Weights   08/07/24 1917 08/08/24 1349  Weight: 74 kg 76.2 kg     Physical examination:   General:  AAO x 3,  cooperative, no distress;   HEENT:  Normocephalic, PERRL, otherwise with in Normal limits   Neuro:  CNII-XII intact. , normal motor and sensation, reflexes intact   Lungs:   Clear to auscultation BL, Respirations unlabored,  No wheezes / crackles  Cardio:    S1/S2, RRR, No murmure, No Rubs or Gallops   Abdomen:  Soft, non-tender, bowel sounds active all four quadrants, no guarding or peritoneal signs.  Muscular  skeletal:  Limited exam -global generalized weaknesses - in bed, able to move all 4 extremities,   2+ pulses,  symmetric, No pitting edema  Skin:  Dry, warm to touch, negative for any Rashes,  Wounds: Please see nursing  documentation -----------------------------------------------------------------------------------------------------------------------------------    LABs:     Latest Ref Rng & Units 08/09/2024    4:10 AM 08/08/2024    4:22 AM 08/07/2024    8:24 PM  CBC  WBC 4.0 - 10.5 K/uL 11.1  15.2  16.2   Hemoglobin 12.0 - 15.0 g/dL 88.3  87.8  86.8   Hematocrit 36.0 - 46.0 % 34.9  35.7  37.5   Platelets 150 - 400 K/uL 89  95  110       Latest Ref Rng & Units 08/09/2024    4:10 AM 08/08/2024    4:22 AM 08/07/2024    8:24 PM  CMP  Glucose 70 - 99 mg/dL 897  95  835   BUN 8 - 23 mg/dL 24  34  34  Creatinine 0.44 - 1.00 mg/dL 9.09  8.34  8.22   Sodium 135 - 145 mmol/L 138  141  137   Potassium 3.5 - 5.1 mmol/L 4.1  3.4  3.6   Chloride 98 - 111 mmol/L 105  103  99   CO2 22 - 32 mmol/L 24  28  23    Calcium 8.9 - 10.3 mg/dL 8.3  8.5  8.8   Total Protein 6.5 - 8.1 g/dL 5.8  6.3  6.7   Total Bilirubin 0.0 - 1.2 mg/dL 0.9  1.4  1.1   Alkaline Phos 38 - 126 U/L 111  100  103   AST 15 - 41 U/L 27  42  59   ALT 0 - 44 U/L 25  37  43        Micro Results Recent Results (from the past 240 hours)  Resp panel by RT-PCR (RSV, Flu A&B, Covid) Anterior Nasal Swab     Status: None   Collection Time: 08/07/24 10:07 PM   Specimen: Anterior Nasal Swab  Result Value Ref Range Status   SARS Coronavirus 2 by RT PCR NEGATIVE NEGATIVE Final    Comment: (NOTE) SARS-CoV-2 target nucleic acids are NOT DETECTED.  The SARS-CoV-2 RNA is generally detectable in upper respiratory specimens during the acute phase of infection. The lowest concentration of SARS-CoV-2 viral copies this assay can detect is 138 copies/mL. A negative result does not preclude SARS-Cov-2 infection and should not be used as the sole basis for treatment or other patient management decisions. A negative result may occur with  improper specimen collection/handling, submission of specimen other than nasopharyngeal swab, presence of viral  mutation(s) within the areas targeted by this assay, and inadequate number of viral copies(<138 copies/mL). A negative result must be combined with clinical observations, patient history, and epidemiological information. The expected result is Negative.  Fact Sheet for Patients:  bloggercourse.com  Fact Sheet for Healthcare Providers:  seriousbroker.it  This test is no t yet approved or cleared by the [place]  FDA and  has been authorized for detection and/or diagnosis of SARS-CoV-2 by FDA under an Emergency Use Authorization (EUA). This EUA will remain  in effect (meaning this test can be used) for the duration of the COVID-19 declaration under Section 564(b)(1) of the Act, 21 U.S.C.section 360bbb-3(b)(1), unless the authorization is terminated  or revoked sooner.       Influenza A by PCR NEGATIVE NEGATIVE Final   Influenza B by PCR NEGATIVE NEGATIVE Final    Comment: (NOTE) The Xpert Xpress SARS-CoV-2/FLU/RSV plus assay is intended as an aid in the diagnosis of influenza from Nasopharyngeal swab specimens and should not be used as a sole basis for treatment. Nasal washings and aspirates are unacceptable for Xpert Xpress SARS-CoV-2/FLU/RSV testing.  Fact Sheet for Patients: bloggercourse.com  Fact Sheet for Healthcare Providers: seriousbroker.it  This test is not yet approved or cleared by the [place]  FDA and has been authorized for detection and/or diagnosis of SARS-CoV-2 by FDA under an Emergency Use Authorization (EUA). This EUA will remain in effect (meaning this test can be used) for the duration of the COVID-19 declaration under Section 564(b)(1) of the Act, 21 U.S.C. section 360bbb-3(b)(1), unless the authorization is terminated or revoked.     Resp Syncytial Virus by PCR NEGATIVE NEGATIVE Final    Comment: (NOTE) Fact Sheet for  Patients: bloggercourse.com  Fact Sheet for Healthcare Providers: seriousbroker.it  This test is not yet approved or cleared by the [place]  FDA  and has been authorized for detection and/or diagnosis of SARS-CoV-2 by FDA under an Emergency Use Authorization (EUA). This EUA will remain in effect (meaning this test can be used) for the duration of the COVID-19 declaration under Section 564(b)(1) of the Act, 21 U.S.C. section 360bbb-3(b)(1), unless the authorization is terminated or revoked.  Performed at [facility], [address], [place], [place] [address]   Blood culture (routine x 2)     Status: None (Preliminary result)   Collection Time: 08/07/24 10:31 PM   Specimen: BLOOD LEFT HAND  Result Value Ref Range Status   Specimen Description BLOOD LEFT HAND  Final   Special Requests   Final    BOTTLES DRAWN AEROBIC AND ANAEROBIC Blood Culture adequate volume   Culture   Final    NO GROWTH 2 DAYS Performed at [facility], [address] [place], [place] [address]    Report Status PENDING  Incomplete  Blood culture (routine x 2)     Status: None (Preliminary result)   Collection Time: 08/07/24 10:44 PM   Specimen: Left Antecubital; Blood  Result Value Ref Range Status   Specimen Description LEFT ANTECUBITAL  Final   Special Requests   Final    BOTTLES DRAWN AEROBIC ONLY Blood Culture adequate volume   Culture   Final    NO GROWTH 2 DAYS Performed at [facility], [address], [place], [place] [address]    Report Status PENDING  Incomplete    Radiology Reports No results found.   SIGNED: Adriana DELENA Grams, MD, FHM. FAAFP. Jolynn Pack - [facility] Time spent - 55 min.  In seeing, evaluating and examining the patient. Reviewing medical records, labs, drawn plan of care. [facility],  Pager (please use [email or web address] to page/ text) Please use Epic Secure Chat for non-urgent communication (7AM-7PM)  If  7PM-7AM, please contact night-coverage [email or web address], 08/09/2024, 12:05 PM

## 2024-08-09 NOTE — TOC Initial Note (Signed)
 Transition of Care [facility]) - Initial/Assessment Note    Patient Details  Name: Caitlin Tanner MRN: [id number] Date of Birth: 1951/04/01  [facility]) CM/SW Contact:    Lucie Lunger, LCSWA Phone Number: 08/09/2024, 11:36 AM  Clinical Narrative:                 Pt is high risk for readmission. CSW spoke with pt at bedside to complete assessment. Pt states that she lives with her husband. Pt is independent in completing her ADLs and drives when needed. Pt states that she has not had HH in the past and does not feel that it is needed at this time. Pt updated that if she gets home and changes her mind she can reach out to her PCP for [facility] to be arranged. Pt does not use any DME in the home. TOC to follow.   Expected Discharge Plan: Home/Self Care Barriers to Discharge: Continued Medical Work up   Patient Goals and CMS Choice Patient states their goals for this hospitalization and ongoing recovery are:: return home CMS Medicare.gov Compare Post Acute Care list provided to:: Patient Choice offered to / list presented to : Patient      Expected Discharge Plan and Services In-house Referral: Clinical Social Work Discharge Planning Services: CM Consult   Living arrangements for the past 2 months: Single Family Home                           HH Arranged: Patient Refused HH          Prior Living Arrangements/Services Living arrangements for the past 2 months: Single Family Home Lives with:: Spouse Patient language and need for interpreter reviewed:: Yes Do you feel safe going back to the place where you live?: Yes      Need for Family Participation in Patient Care: Yes (Comment) Care giver support system in place?: Yes (comment)   Criminal Activity/Legal Involvement Pertinent to Current Situation/Hospitalization: No - Comment as needed  Activities of Daily Living   ADL Screening (condition at time of admission) Independently performs ADLs?: Yes (appropriate for  developmental age) Is the patient deaf or have difficulty hearing?: No Does the patient have difficulty seeing, even when wearing glasses/contacts?: No Does the patient have difficulty concentrating, remembering, or making decisions?: No  Permission Sought/Granted                  Emotional Assessment Appearance:: Appears stated age Attitude/Demeanor/Rapport: Engaged Affect (typically observed): Accepting Orientation: : Oriented to Self, Oriented to Place, Oriented to  Time, Oriented to Situation Alcohol / Substance Use: Not Applicable Psych Involvement: No (comment)  Admission diagnosis:  Dehydration [E86.0] Generalized weakness [R53.1] AKI (acute kidney injury) [N17.9] Sepsis (HCC) [A41.9] Nausea and vomiting, unspecified vomiting type [R11.2] Patient Active Problem List   Diagnosis Date Noted   Sepsis (HCC) 08/07/2024   Yeast dermatitis 05/12/2023   Prolapsed internal hemorrhoids, grade 3 02/12/2023   Acute cough 08/23/2021   Restrictive lung disease 01/08/2021   Dyspepsia 06/09/2019   Hematochezia 06/09/2019   Moderate persistent asthma, uncomplicated 03/11/2019   Acute non-recurrent maxillary sinusitis 03/11/2019   Chronic rhinitis 03/11/2019   Nausea without vomiting 07/08/2013   Need for prophylactic vaccination and inoculation against influenza 06/12/2013   Well controlled persistent asthma 06/12/2013   Multiple lung nodules 06/12/2013   Abdominal bloating 11/24/2011   Left lower quadrant pain 10/22/2011   Internal hemorrhoids 05/29/2009   Gastroesophageal  reflux disease 05/29/2009   HEMATOCHEZIA 05/29/2009   Anxiety state 05/28/2009   IRRITABLE BOWEL SYNDROME 05/28/2009   DIARRHEA 05/28/2009   Allergy 05/28/2009   PCP:  Viktoria Clunes E Pharmacy:   [phone] [place] [place] - [address] [place] [address] Phone: [phone] Fax: [phone]  [facility] - [place], [place] - [address] [facility] [address] Portal to Registered [facility] [address] [address] Phone: [phone] Fax: [phone]     Social Drivers of Health (SDOH) Social History: SDOH Screenings   Food Insecurity: No Food Insecurity (08/08/2024)  Housing: Low Risk  (08/08/2024)  Transportation Needs: No Transportation Needs (08/08/2024)  Utilities: Not At Risk (08/08/2024)  Social Connections: Unknown (08/08/2024)  Tobacco Use: Medium Risk (08/08/2024)   SDOH Interventions:     Readmission Risk Interventions    08/09/2024   11:18 AM 08/08/2024   12:21 PM  Readmission Risk Prevention Plan  Transportation Screening Complete Complete  PCP or Specialist Appt within 5-7 Days  Complete  Home Care Screening  Complete  Medication Review (RN CM)  Complete  HRI or Home Care Consult Complete   Social Work Consult for Recovery Care Planning/Counseling Complete   Palliative Care Screening Not Applicable   Medication Review Oceanographer) Complete

## 2024-08-09 NOTE — Progress Notes (Addendum)
 Physical Therapy Treatment Patient Details Name: Caitlin Tanner MRN: [id number] DOB: 1951/04/19 Today's Date: 08/09/2024   History of Present Illness Caitlin Tanner is a 73 y.o. female with medical history significant for vascular insufficiency, mixed anxiety and depression disorder, hyperlipidemia, hypertension, type 2 diabetes, and GERD, who presents to the ER from home due to nausea, vomiting, suprapubic pain, generalized weakness, and lower mid back pain for the past 1 day.  Symptoms started last night around 11:30 PM.  EMS was activated.  When EMS arrived, vital signs were normal and patient was advised to follow-up with PCP.  However the patient's symptoms recurred and were more severe.  Her husband brought her to the ER today for further evaluation.  Endorses chills but no fevers.    PT Comments  Patient agreeable to PT treatment session. Received patient in bed, with sister present. Pt agrees to complete LE exercises seated at EOB. Cont Mod independent with bed mobility this date. Once EOB, pt demonstrates good core strength and seated balance while performing LE exercises with verbal and visual cueing. Completed STS this session without an AD and demonstrated overall good stability with each rep. Print off given of HEP. Pt reporting fatigue at end of exercises. Politely declining further mobility as she reports she had been up to the restroom multiple times today as well, which fatigues her moderately, about 6/10 RPE. Pt returns to bed, call button in reach, all needs met, and family present. Patient will benefit from continued skilled physical therapy in recommended venue in order to maintain and/or improve overall function.     If plan is discharge home, recommend the following: A little help with walking and/or transfers;A little help with bathing/dressing/bathroom;Help with stairs or ramp for entrance;Assistance with cooking/housework;Assist for transportation   Can travel by private  vehicle        Equipment Recommendations  Rolling walker (2 wheels)    Recommendations for Other Services       Precautions / Restrictions Precautions Precautions: Fall Recall of Precautions/Restrictions: Intact Restrictions Weight Bearing Restrictions Per Provider Order: No     Mobility  Bed Mobility Overal bed mobility: Modified Independent     General bed mobility comments: HOB elevated, no physical assist needed    Transfers Overall transfer level: Needs assistance Equipment used: None Transfers: Sit to/from Stand Sit to Stand: Supervision, Modified independent (Device/Increase time)           General transfer comment: 5 STS in session, no physical assist but supervision for safety    Ambulation/Gait               General Gait Details: pt reports ambulating to/from bed to restroom a few times during the day. fatigues her moderately. Declines ambulation following exercises this date due to fatigue   Stairs             Wheelchair Mobility     Tilt Bed    Modified Rankin (Stroke Patients Only)       Balance Overall balance assessment: Needs assistance Sitting-balance support: Feet supported, No upper extremity supported Sitting balance-Leahy Scale: Good Sitting balance - Comments: seated at EOB   Standing balance support: No upper extremity supported, During functional activity Standing balance-Leahy Scale: Fair Standing balance comment: during STS without AD        Communication Communication Communication: No apparent difficulties  Cognition Arousal: Alert Behavior During Therapy: WFL for tasks assessed/performed   PT - Cognitive impairments: No apparent impairments  Following commands: Intact      Cueing Cueing Techniques: Verbal cues  Exercises General Exercises - Lower Extremity Long Arc Quad: AROM, Strengthening, Both, 10 reps, Seated Hip ABduction/ADduction: AROM, Strengthening, Both, 10 reps, Seated Hip  Flexion/Marching: AROM, Strengthening, Both, 10 reps, Seated Toe Raises: AROM, Strengthening, Both, 10 reps, Seated Heel Raises: AROM, Strengthening, Both, 10 reps, Seated    General Comments        Pertinent Vitals/Pain Pain Assessment Pain Assessment: No/denies pain    Home Living                          Prior Function            PT Goals (current goals can now be found in the care plan section) Acute Rehab PT Goals Patient Stated Goal: return home with family to assist PT Goal Formulation: With patient/family Time For Goal Achievement: 08/12/24 Potential to Achieve Goals: Good Progress towards PT goals: Progressing toward goals    Frequency    Min 3X/week      PT Plan      Co-evaluation              AM-PAC PT 6 Clicks Mobility   Outcome Measure  Help needed turning from your back to your side while in a flat bed without using bedrails?: None Help needed moving from lying on your back to sitting on the side of a flat bed without using bedrails?: A Little Help needed moving to and from a bed to a chair (including a wheelchair)?: A Little Help needed standing up from a chair using your arms (e.g., wheelchair or bedside chair)?: A Little Help needed to walk in hospital room?: A Little Help needed climbing 3-5 steps with a railing? : A Little 6 Click Score: 19    End of Session   Activity Tolerance: Patient tolerated treatment well;Patient limited by fatigue Patient left: in bed;with call bell/phone within reach;with family/visitor present   PT Visit Diagnosis: Unsteadiness on feet (R26.81);Other abnormalities of gait and mobility (R26.89);Muscle weakness (generalized) (M62.81)     Time: 8546-8493 PT Time Calculation (min) (ACUTE ONLY): 13 min  Charges:    $Therapeutic Exercise: 8-22 mins PT General Charges $$ ACUTE PT VISIT: 1 Visit                     3:17 PM, 08/09/24 Travone Georg Powell-Butler, PT, DPT [place] with [facility]

## 2024-08-09 NOTE — Progress Notes (Signed)
 Denied any nausea but did request pain medication for headache.  Lab called and they need more urine for culture.  Collection hat place in toilet.

## 2024-08-10 ENCOUNTER — Ambulatory Visit: Admitting: Allergy & Immunology

## 2024-08-10 DIAGNOSIS — N390 Urinary tract infection, site not specified: Secondary | ICD-10-CM | POA: Diagnosis not present

## 2024-08-10 DIAGNOSIS — A419 Sepsis, unspecified organism: Secondary | ICD-10-CM | POA: Diagnosis not present

## 2024-08-10 LAB — CBC
HCT: 33.4 % — ABNORMAL LOW (ref 36.0–46.0)
Hemoglobin: 11.3 g/dL — ABNORMAL LOW (ref 12.0–15.0)
MCH: 30.8 pg (ref 26.0–34.0)
MCHC: 33.8 g/dL (ref 30.0–36.0)
MCV: 91 fL (ref 80.0–100.0)
Platelets: 108 K/uL — ABNORMAL LOW (ref 150–400)
RBC: 3.67 MIL/uL — ABNORMAL LOW (ref 3.87–5.11)
RDW: 12.8 % (ref 11.5–15.5)
WBC: 9 K/uL (ref 4.0–10.5)
nRBC: 0 % (ref 0.0–0.2)

## 2024-08-10 LAB — COMPREHENSIVE METABOLIC PANEL WITH GFR
ALT: 22 U/L (ref 0–44)
AST: 27 U/L (ref 15–41)
Albumin: 3.4 g/dL — ABNORMAL LOW (ref 3.5–5.0)
Alkaline Phosphatase: 102 U/L (ref 38–126)
Anion gap: 11 (ref 5–15)
BUN: 15 mg/dL (ref 8–23)
CO2: 23 mmol/L (ref 22–32)
Calcium: 8.7 mg/dL — ABNORMAL LOW (ref 8.9–10.3)
Chloride: 104 mmol/L (ref 98–111)
Creatinine, Ser: 0.83 mg/dL (ref 0.44–1.00)
GFR, Estimated: >60 mL/min (ref >60)
Glucose, Bld: 99 mg/dL (ref 70–99)
Potassium: 4.4 mmol/L (ref 3.5–5.1)
Sodium: 138 mmol/L (ref 135–145)
Total Bilirubin: 0.9 mg/dL (ref 0.0–1.2)
Total Protein: 6.1 g/dL — ABNORMAL LOW (ref 6.5–8.1)

## 2024-08-10 LAB — GLUCOSE, CAPILLARY: Glucose-Capillary: 98 mg/dL (ref 70–99)

## 2024-08-10 MED ORDER — CIPROFLOXACIN HCL 500 MG PO TABS: 500.0000 mg | ORAL_TABLET | Freq: Two times a day (BID) | ORAL | 0 refills | Status: AC

## 2024-08-10 MED ORDER — VITAMIN D 25 MCG (1000 UNIT) PO TABS
2000.0000 [IU] | ORAL_TABLET | Freq: Every day | ORAL | Status: DC
  Administered 2024-08-10: 2000 [IU] via ORAL
  Filled 2024-08-10: qty 2

## 2024-08-10 MED ORDER — ADULT MULTIVITAMIN W/MINERALS CH
1.0000 | ORAL_TABLET | Freq: Every day | ORAL | Status: DC
  Administered 2024-08-10: 1 via ORAL
  Filled 2024-08-10: qty 1

## 2024-08-10 MED ORDER — METFORMIN HCL 500 MG PO TABS: 500.0000 mg | ORAL_TABLET | Freq: Every day | ORAL | Status: DC

## 2024-08-10 MED ORDER — ASPIRIN 81 MG PO TBEC
81.0000 mg | DELAYED_RELEASE_TABLET | Freq: Every day | ORAL | Status: DC
  Administered 2024-08-10: 81 mg via ORAL
  Filled 2024-08-10: qty 1

## 2024-08-10 MED ORDER — LOSARTAN POTASSIUM 50 MG PO TABS
50.0000 mg | ORAL_TABLET | Freq: Every day | ORAL | Status: DC
  Administered 2024-08-10: 50 mg via ORAL
  Filled 2024-08-10: qty 1

## 2024-08-10 MED ORDER — METOPROLOL SUCCINATE ER 50 MG PO TB24
50.0000 mg | ORAL_TABLET | Freq: Every day | ORAL | Status: DC
  Administered 2024-08-10: 50 mg via ORAL
  Filled 2024-08-10: qty 1

## 2024-08-10 MED ORDER — ESCITALOPRAM OXALATE 10 MG PO TABS
10.0000 mg | ORAL_TABLET | Freq: Every day | ORAL | Status: DC
  Administered 2024-08-10: 10 mg via ORAL
  Filled 2024-08-10: qty 1

## 2024-08-10 MED ORDER — LOSARTAN POTASSIUM-HCTZ 50-12.5 MG PO TABS: 1.0000 | ORAL_TABLET | Freq: Every day | ORAL | Status: DC

## 2024-08-10 MED ORDER — HYDROCHLOROTHIAZIDE 12.5 MG PO TABS
12.5000 mg | ORAL_TABLET | Freq: Every day | ORAL | Status: DC
  Administered 2024-08-10: 12.5 mg via ORAL
  Filled 2024-08-10: qty 1

## 2024-08-10 MED ORDER — FAMOTIDINE 20 MG PO TABS
40.0000 mg | ORAL_TABLET | Freq: Every day | ORAL | Status: DC
  Administered 2024-08-10: 40 mg via ORAL
  Filled 2024-08-10: qty 2

## 2024-08-10 MED ORDER — EZETIMIBE 10 MG PO TABS
10.0000 mg | ORAL_TABLET | Freq: Every day | ORAL | Status: DC
  Administered 2024-08-10: 10 mg via ORAL
  Filled 2024-08-10: qty 1

## 2024-08-10 MED ORDER — MULTIVITAMINS PO CAPS: 1.0000 | ORAL_CAPSULE | Freq: Every day | ORAL | Status: DC

## 2024-08-10 MED ORDER — LACTINEX PO CHEW: 1.0000 | CHEWABLE_TABLET | Freq: Three times a day (TID) | ORAL | 0 refills | Status: AC

## 2024-08-10 NOTE — Discharge Summary (Signed)
 Physician Discharge Summary   Patient: Caitlin Tanner MRN: [id number] DOB: January 17, 1951  Admit date:     08/07/2024  Discharge date: 08/10/24  Discharge Physician: Adriana DELENA Grams   PCP: Viktoria Clunes E   Recommendations at discharge:   Follow-up with PCP in 1-2 weeks Follow-up with a urologist with recurrent urinary tract infections  Discharge Diagnoses: Principal Problem:   Sepsis (HCC) Urinary tract infection  Hospital Course: Caitlin Tanner is a 73 y.o. female with medical history significant for vascular insufficiency, mixed anxiety and depression disorder, hyperlipidemia, hypertension, type 2 diabetes, and GERD, who presents to the ER from home due to nausea, vomiting, suprapubic pain, generalized weakness, and lower mid back pain for the past 1 day.  Symptoms started last night around 11:30 PM.  EMS was activated. X 2.     ER:: Temperature 98.6.  BP 127/60, pulse 111, respiratory rate 22, O2 saturation 95% RA  WBC: 16.2 K, lactic acid 2.0.  EDP concern for possible UTI.  Code sepsis called in the ER.  The patient received IV Cefepime and 1 L IV fluid bolus LR x 1.       Sepsis secondary to possible UTI, POA. -Sepsis physiology-resolved, hemodynamically stable  POA met sepsis criteria,  - UA positive, cultures were not sent from the ED - Was treated with IV cefepime >>> Rocephin, now switched to p.o. ciprofloxacin -WBC 15.2, 11.1, 9.0, Lactic acid is 1.9 from 2.0 >>1.9   AKI, prerenal in the setting of nausea, vomiting, and dehydration dehydration from poor oral intake. Baseline creatinine 0.9 with GFR of greater than 60 Presented with creatinine of 1.77 with GFR of 29 Lab Results  Component Value Date   CREATININE 0.83 08/10/2024   CREATININE 0.90 08/09/2024   CREATININE 1.65 (H) 08/08/2024    Avoid nephrotoxic agents, dehydration, and hypotension. Monitor urine output Repeat chemistry panel in the morning.   Elevated AST, nonspecific -resolved     Hypokalemia -resolved Serum potassium 3.4, replating orally  Prediabetic hyperglycemia -A1c 6.2 Heart healthy carb modified diet   Lactic acidosis, multifactorial, secondary to infection, dehydration Responded to IV fluid hydration, Lactic acid 2.0, repeat 1.9 post IV fluid hydration.    Generalized weakness PT OT - Fall precautions.     Disposition: Home Diet recommendation:  Discharge Diet Orders (From admission, onward)     Start     Ordered   08/10/24 0000  Diet - low sodium heart healthy        08/10/24 1042           Cardiac and Carb modified diet DISCHARGE MEDICATION: Allergies as of 08/10/2024       Reactions   Conj Estrog-medroxyprogest Ace    REACTION: UNKNOWN REACTION   Ketorolac Tromethamine    Felt like she had bugs in her head   Sulfa Antibiotics Nausea And Vomiting   Tramadol Hcl    Other Reaction(s): Not available        Medication List     STOP taking these medications    acetaminophen 650 MG CR tablet Commonly known as: TYLENOL   benzonatate  100 MG capsule Commonly known as: TESSALON    bismuth subsalicylate 262 MG/15ML suspension Commonly known as: PEPTO BISMOL   cetirizine  10 MG tablet Commonly known as: ZYRTEC    losartan 50 MG tablet Commonly known as: COZAAR   multivitamin capsule   pantoprazole  40 MG tablet Commonly known as: PROTONIX        TAKE these medications    albuterol   108 (90 Base) MCG/ACT inhaler Commonly known as: VENTOLIN  HFA Inhale 2 puffs into the lungs every 4 (four) hours as needed for wheezing or shortness of breath.   aspirin EC 81 MG tablet Take 81 mg by mouth daily.   Azelastine -Fluticasone  137-50 MCG/ACT Susp Commonly known as: Dymista  Place 2 sprays into the nose 2 (two) times daily.   AZO CRANBERRY PO Take 1 tablet by mouth in the morning and at bedtime.   ciprofloxacin 500 MG tablet Commonly known as: Cipro Take 1 tablet (500 mg total) by mouth 2 (two) times daily for 5  days. Start taking on: August 11, 2024   escitalopram 10 MG tablet Commonly known as: LEXAPRO Take 10 mg by mouth daily.   ezetimibe 10 MG tablet Commonly known as: ZETIA Take 10 mg by mouth daily.   famotidine  40 MG tablet Commonly known as: Pepcid  Take 1 tablet (40 mg total) by mouth daily.   Fish Oil 1000 MG Caps Take 1,000 mg by mouth daily.   fluticasone -salmeterol 230-21 MCG/ACT inhaler Commonly known as: Advair  HFA Inhale 2 puffs into the lungs 2 (two) times daily.   GARLIC PO Take 1 capsule by mouth daily.   lactobacillus acidophilus & bulgar chewable tablet Chew 1 tablet by mouth 3 (three) times daily with meals for 10 days.   LORazepam 0.5 MG tablet Commonly known as: ATIVAN Take 0.5 mg by mouth daily as needed for anxiety.   losartan-hydrochlorothiazide 50-12.5 MG tablet Commonly known as: HYZAAR Take 1 tablet by mouth daily.   metFORMIN 500 MG tablet Commonly known as: GLUCOPHAGE Take 500 mg by mouth See admin instructions. 500 mg in the morning if blood sugar is above 140, 500 mg every evening   metoprolol succinate 50 MG 24 hr tablet Commonly known as: TOPROL-XL Take 50 mg by mouth daily.   PreserVision AREDS 2 Caps Take 1 capsule by mouth in the morning and at bedtime.   Spacer/Aero-Holding [employer] 1 Device by Does not apply route as directed.   tretinoin 0.05 % cream Commonly known as: RETIN-A Apply 1 application  topically at bedtime.   Vitamin D3 25 MCG (1000 UT) Caps Take 2,000 Units by mouth daily.               Durable Medical Equipment  (From admission, onward)           Start     Ordered   08/09/24 1152  For home use only DME Walker rolling  Once       Comments: Patient unsteady on feet and at risk for falls, safer using RW with good return for use demonstrated.  Question Answer Comment  Walker: With 5 Inch Wheels   Patient needs a walker to treat with the following condition Gait difficulty      08/09/24  1152            Discharge Exam: Filed Weights   08/07/24 1917 08/08/24 1349  Weight: 74 kg 76.2 kg        General:  AAO x 3,  cooperative, no distress;   HEENT:  Normocephalic, PERRL, otherwise with in Normal limits   Neuro:  CNII-XII intact. , normal motor and sensation, reflexes intact   Lungs:   Clear to auscultation BL, Respirations unlabored,  No wheezes / crackles  Cardio:    S1/S2, RRR, No murmure, No Rubs or Gallops   Abdomen:  Soft, non-tender, bowel sounds active all four quadrants, no guarding or peritoneal signs.  Muscular  skeletal:  Limited exam -global generalized weaknesses - in bed, able to move all 4 extremities,   2+ pulses,  symmetric, No pitting edema  Skin:  Dry, warm to touch, negative for any Rashes,  Wounds: Please see nursing documentation          Condition at discharge: good  The results of significant diagnostics from this hospitalization (including imaging, microbiology, ancillary and laboratory) are listed below for reference.   Imaging Studies: DG Chest Port 1 View Result Date: 08/07/2024 EXAM: 1 VIEW(S) XRAY OF THE CHEST 08/07/2024 09:57:09 PM COMPARISON: 12/02/2023 CLINICAL HISTORY: Questionable sepsis - evaluate for abnormality FINDINGS: LUNGS AND PLEURA: Mild bibasilar scarring, chronic. No pulmonary edema. No pleural effusion. No pneumothorax. HEART AND MEDIASTINUM: No acute abnormality of the cardiac and mediastinal silhouettes. BONES AND SOFT TISSUES: No acute osseous abnormality. IMPRESSION: 1. No acute cardiopulmonary process. Electronically signed by: Pinkie Pebbles MD 08/07/2024 09:59 PM EST RP Workstation: [id number]    Microbiology: Results for orders placed or performed during the hospital encounter of 08/07/24  Resp panel by RT-PCR (RSV, Flu A&B, Covid) Anterior Nasal Swab     Status: None   Collection Time: 08/07/24 10:07 PM   Specimen: Anterior Nasal Swab  Result Value Ref Range Status   SARS Coronavirus 2 by RT  PCR NEGATIVE NEGATIVE Final    Comment: (NOTE) SARS-CoV-2 target nucleic acids are NOT DETECTED.  The SARS-CoV-2 RNA is generally detectable in upper respiratory specimens during the acute phase of infection. The lowest concentration of SARS-CoV-2 viral copies this assay can detect is 138 copies/mL. A negative result does not preclude SARS-Cov-2 infection and should not be used as the sole basis for treatment or other patient management decisions. A negative result may occur with  improper specimen collection/handling, submission of specimen other than nasopharyngeal swab, presence of viral mutation(s) within the areas targeted by this assay, and inadequate number of viral copies(<138 copies/mL). A negative result must be combined with clinical observations, patient history, and epidemiological information. The expected result is Negative.  Fact Sheet for Patients:  bloggercourse.com  Fact Sheet for Healthcare Providers:  seriousbroker.it  This test is no t yet approved or cleared by the [place]  FDA and  has been authorized for detection and/or diagnosis of SARS-CoV-2 by FDA under an Emergency Use Authorization (EUA). This EUA will remain  in effect (meaning this test can be used) for the duration of the COVID-19 declaration under Section 564(b)(1) of the Act, 21 U.S.C.section 360bbb-3(b)(1), unless the authorization is terminated  or revoked sooner.       Influenza A by PCR NEGATIVE NEGATIVE Final   Influenza B by PCR NEGATIVE NEGATIVE Final    Comment: (NOTE) The Xpert Xpress SARS-CoV-2/FLU/RSV plus assay is intended as an aid in the diagnosis of influenza from Nasopharyngeal swab specimens and should not be used as a sole basis for treatment. Nasal washings and aspirates are unacceptable for Xpert Xpress SARS-CoV-2/FLU/RSV testing.  Fact Sheet for Patients: bloggercourse.com  Fact Sheet for  Healthcare Providers: seriousbroker.it  This test is not yet approved or cleared by the [place]  FDA and has been authorized for detection and/or diagnosis of SARS-CoV-2 by FDA under an Emergency Use Authorization (EUA). This EUA will remain in effect (meaning this test can be used) for the duration of the COVID-19 declaration under Section 564(b)(1) of the Act, 21 U.S.C. section 360bbb-3(b)(1), unless the authorization is terminated or revoked.     Resp Syncytial Virus by PCR NEGATIVE NEGATIVE Final  Comment: (NOTE) Fact Sheet for Patients: bloggercourse.com  Fact Sheet for Healthcare Providers: seriousbroker.it  This test is not yet approved or cleared by the [place]  FDA and has been authorized for detection and/or diagnosis of SARS-CoV-2 by FDA under an Emergency Use Authorization (EUA). This EUA will remain in effect (meaning this test can be used) for the duration of the COVID-19 declaration under Section 564(b)(1) of the Act, 21 U.S.C. section 360bbb-3(b)(1), unless the authorization is terminated or revoked.  Performed at [facility], [address] [place], [place] [address]   Blood culture (routine x 2)     Status: None (Preliminary result)   Collection Time: 08/07/24 10:31 PM   Specimen: BLOOD LEFT HAND  Result Value Ref Range Status   Specimen Description BLOOD LEFT HAND  Final   Special Requests   Final    BOTTLES DRAWN AEROBIC AND ANAEROBIC Blood Culture adequate volume   Culture   Final    NO GROWTH 2 DAYS Performed at [facility], [address], [place], [place] [address]    Report Status PENDING  Incomplete  Blood culture (routine x 2)     Status: None (Preliminary result)   Collection Time: 08/07/24 10:44 PM   Specimen: Left Antecubital; Blood  Result Value Ref Range Status   Specimen Description LEFT ANTECUBITAL  Final   Special Requests   Final    BOTTLES DRAWN  AEROBIC ONLY Blood Culture adequate volume   Culture   Final    NO GROWTH 2 DAYS Performed at [facility], [address], [place], [place] [address]    Report Status PENDING  Incomplete    Labs: CBC: Recent Labs  Lab 08/07/24 2024 08/08/24 0422 08/09/24 0410 08/10/24 0413  WBC 16.2* 15.2* 11.1* 9.0  NEUTROABS  --  13.0*  --   --   HGB 13.1 12.1 11.6* 11.3*  HCT 37.5 35.7* 34.9* 33.4*  MCV 90.4 91.3 93.8 91.0  PLT 110* 95* 89* 108*   Basic Metabolic Panel: Recent Labs  Lab 08/07/24 2024 08/08/24 0422 08/09/24 0410 08/10/24 0413  NA 137 141 138 138  K 3.6 3.4* 4.1 4.4  CL 99 103 105 104  CO2 23 28 24 23   GLUCOSE 164* 95 102* 99  BUN 34* 34* 24* 15  CREATININE 1.77* 1.65* 0.90 0.83  CALCIUM 8.8* 8.5* 8.3* 8.7*  MG  --  1.8  --   --   PHOS  --  2.9  --   --    Liver Function Tests: Recent Labs  Lab 08/07/24 2024 08/08/24 0422 08/09/24 0410 08/10/24 0413  AST 59* 42* 27 27  ALT 43 37 25 22  ALKPHOS 103 100 111 102  BILITOT 1.1 1.4* 0.9 0.9  PROT 6.7 6.3* 5.8* 6.1*  ALBUMIN 4.0 3.7 3.2* 3.4*   CBG: Recent Labs  Lab 08/08/24 0804 08/09/24 0737 08/09/24 1617 08/09/24 2026 08/10/24 0727  GLUCAP 95 112* 113* 115* 98    Discharge time spent: greater than 40 minutes.  Signed: Adriana DELENA Grams, MD [facility] 08/10/2024

## 2024-08-10 NOTE — Plan of Care (Signed)

## 2024-08-12 LAB — CULTURE, BLOOD (ROUTINE X 2)
Culture: NO GROWTH
Culture: NO GROWTH
Special Requests: ADEQUATE
Special Requests: ADEQUATE
